# Patient Record
Sex: Female | Born: 1950 | Race: White | Hispanic: No | Marital: Married | Smoking: Never smoker
Health system: Southern US, Community
[De-identification: ages and names within clinical notes are randomized; demographics above are authoritative.]

## PROBLEM LIST (undated history)

## (undated) DIAGNOSIS — R5383 Other fatigue: Secondary | ICD-10-CM

## (undated) DIAGNOSIS — K5900 Constipation, unspecified: Secondary | ICD-10-CM

## (undated) DIAGNOSIS — J45909 Unspecified asthma, uncomplicated: Secondary | ICD-10-CM

## (undated) DIAGNOSIS — M25569 Pain in unspecified knee: Secondary | ICD-10-CM

## (undated) DIAGNOSIS — E559 Vitamin D deficiency, unspecified: Secondary | ICD-10-CM

## (undated) DIAGNOSIS — M549 Dorsalgia, unspecified: Secondary | ICD-10-CM

## (undated) DIAGNOSIS — K219 Gastro-esophageal reflux disease without esophagitis: Secondary | ICD-10-CM

## (undated) DIAGNOSIS — K2970 Gastritis, unspecified, without bleeding: Secondary | ICD-10-CM

## (undated) DIAGNOSIS — R87619 Unspecified abnormal cytological findings in specimens from cervix uteri: Secondary | ICD-10-CM

## (undated) DIAGNOSIS — K5790 Diverticulosis of intestine, part unspecified, without perforation or abscess without bleeding: Secondary | ICD-10-CM

## (undated) DIAGNOSIS — K8020 Calculus of gallbladder without cholecystitis without obstruction: Secondary | ICD-10-CM

## (undated) DIAGNOSIS — C801 Malignant (primary) neoplasm, unspecified: Secondary | ICD-10-CM

## (undated) DIAGNOSIS — K449 Diaphragmatic hernia without obstruction or gangrene: Secondary | ICD-10-CM

## (undated) DIAGNOSIS — D649 Anemia, unspecified: Secondary | ICD-10-CM

## (undated) DIAGNOSIS — R600 Localized edema: Secondary | ICD-10-CM

## (undated) DIAGNOSIS — M1990 Unspecified osteoarthritis, unspecified site: Secondary | ICD-10-CM

## (undated) DIAGNOSIS — M25559 Pain in unspecified hip: Secondary | ICD-10-CM

## (undated) DIAGNOSIS — R0602 Shortness of breath: Secondary | ICD-10-CM

## (undated) HISTORY — DX: Shortness of breath: R06.02

## (undated) HISTORY — DX: Pain in unspecified hip: M25.559

## (undated) HISTORY — DX: Vitamin D deficiency, unspecified: E55.9

## (undated) HISTORY — PX: CHOLECYSTECTOMY: SHX55

## (undated) HISTORY — DX: Diverticulosis of intestine, part unspecified, without perforation or abscess without bleeding: K57.90

## (undated) HISTORY — DX: Unspecified abnormal cytological findings in specimens from cervix uteri: R87.619

## (undated) HISTORY — PX: TUBAL LIGATION: SHX77

## (undated) HISTORY — DX: Diaphragmatic hernia without obstruction or gangrene: K44.9

## (undated) HISTORY — DX: Constipation, unspecified: K59.00

## (undated) HISTORY — DX: Dorsalgia, unspecified: M54.9

## (undated) HISTORY — DX: Unspecified asthma, uncomplicated: J45.909

## (undated) HISTORY — DX: Unspecified osteoarthritis, unspecified site: M19.90

## (undated) HISTORY — DX: Anemia, unspecified: D64.9

## (undated) HISTORY — DX: Malignant (primary) neoplasm, unspecified: C80.1

## (undated) HISTORY — DX: Calculus of gallbladder without cholecystitis without obstruction: K80.20

## (undated) HISTORY — PX: BACK SURGERY: SHX140

## (undated) HISTORY — DX: Gastritis, unspecified, without bleeding: K29.70

## (undated) HISTORY — DX: Pain in unspecified knee: M25.569

## (undated) HISTORY — DX: Gastro-esophageal reflux disease without esophagitis: K21.9

## (undated) HISTORY — DX: Other fatigue: R53.83

## (undated) HISTORY — DX: Localized edema: R60.0

---

## 2004-02-11 ENCOUNTER — Emergency Department (HOSPITAL_COMMUNITY): Admission: EM | Admit: 2004-02-11 | Discharge: 2004-02-11 | Payer: Self-pay | Admitting: Emergency Medicine

## 2007-03-29 ENCOUNTER — Other Ambulatory Visit: Admission: RE | Admit: 2007-03-29 | Discharge: 2007-03-29 | Payer: Self-pay | Admitting: Obstetrics & Gynecology

## 2008-04-09 ENCOUNTER — Other Ambulatory Visit: Admission: RE | Admit: 2008-04-09 | Discharge: 2008-04-09 | Payer: Self-pay | Admitting: Obstetrics & Gynecology

## 2009-07-09 ENCOUNTER — Encounter: Admission: RE | Admit: 2009-07-09 | Discharge: 2009-07-09 | Payer: Self-pay | Admitting: Family Medicine

## 2010-05-22 HISTORY — PX: KNEE SURGERY: SHX244

## 2012-10-16 ENCOUNTER — Other Ambulatory Visit: Payer: Self-pay | Admitting: *Deleted

## 2012-10-16 NOTE — Telephone Encounter (Signed)
Last Aex 01/07/13 Next Aex 01/16/13 #30 with 2 rf to get her to next annual ok to send?

## 2012-10-17 MED ORDER — TEMAZEPAM 15 MG PO CAPS: 15.0000 mg | ORAL_CAPSULE | Freq: Every evening | ORAL | Status: DC | PRN

## 2012-10-17 NOTE — Telephone Encounter (Signed)
Yes

## 2012-12-26 ENCOUNTER — Other Ambulatory Visit: Payer: Self-pay | Admitting: *Deleted

## 2012-12-26 NOTE — Telephone Encounter (Signed)
Fax From: [facility] for Temazepam 15 mg  Last Refilled: 10/16/12 #30/1  (on epic) Aex Scheduled: 01/16/13  Please advise. (Chart in your door)

## 2012-12-27 ENCOUNTER — Telehealth: Payer: Self-pay | Admitting: Obstetrics & Gynecology

## 2012-12-27 MED ORDER — TEMAZEPAM 15 MG PO CAPS: 15.0000 mg | ORAL_CAPSULE | Freq: Every evening | ORAL | Status: DC | PRN

## 2012-12-27 NOTE — Telephone Encounter (Signed)
Rx for Temazepan  Faxed to [facility] and called in to pharmacy also. Left message of this on CB# of patient.

## 2012-12-27 NOTE — Telephone Encounter (Signed)
Patient needs her Temazepam refill please. Has appointment on the 28th.

## 2013-01-16 ENCOUNTER — Ambulatory Visit (INDEPENDENT_AMBULATORY_CARE_PROVIDER_SITE_OTHER): Payer: BC Managed Care – PPO | Admitting: Obstetrics & Gynecology

## 2013-01-16 ENCOUNTER — Encounter: Payer: Self-pay | Admitting: Obstetrics & Gynecology

## 2013-01-16 VITALS — BP 138/86 | HR 60 | Resp 16 | Ht 59.5 in | Wt 219.4 lb

## 2013-01-16 DIAGNOSIS — Z124 Encounter for screening for malignant neoplasm of cervix: Secondary | ICD-10-CM

## 2013-01-16 DIAGNOSIS — Z01419 Encounter for gynecological examination (general) (routine) without abnormal findings: Secondary | ICD-10-CM

## 2013-01-16 MED ORDER — TEMAZEPAM 15 MG PO CAPS: 15.0000 mg | ORAL_CAPSULE | Freq: Every evening | ORAL | Status: DC | PRN

## 2013-01-16 NOTE — Progress Notes (Signed)
62 y.o. G3P2 MarriedCaucasianF here for annual exam.  Raising her 54 year old grandson.  No vaginal bleeding   Patient's last menstrual period was 09/20/2011.          Sexually active: yes  The current method of family planning is status post hysterectomy.    Exercising: no   Smoker:  no  Health Maintenance: Pap:  7/12 History of abnormal Pap:  no MMG:  8/13 Colonoscopy:  12/10, Dr. Elnoria Howard polyp, f/u 5 years BMD:   4/10 0.9/0.5 TDaP:  Up to date per pt, Dr. Ricki Lataja Newland follows Screening Labs: Dr. Ricki Demaryius Imran, Hb today: Dr. Ricki Wess Baney, Urine today: Dr. Ricki Tabias Swayze   reports that she has never smoked. She has never used smokeless tobacco. She reports that she does not drink alcohol or use illicit drugs.  Past Medical History  Diagnosis Date  . Hiatal hernia   . Gastritis     acute  . Diverticulosis   . Gallstones   . Anemia   . GERD (gastroesophageal reflux disease)     Past Surgical History  Procedure Laterality Date  . Tubal ligation    . Cholecystectomy    . Knee surgery  2012    left knee  . Back surgery      Current Outpatient Prescriptions  Medication Sig Dispense Refill  . budesonide-formoterol (SYMBICORT) 160-4.5 MCG/ACT inhaler Inhale 2 puffs into the lungs 2 (two) times daily.      . Cholecalciferol (VITAMIN D PO) Take 5,000 Int'l Units by mouth daily.      . citalopram (CELEXA) 10 MG tablet Take 10 mg by mouth daily.      . hyoscyamine (ANASPAZ) 0.125 MG TBDP tablet Place under the tongue as needed.      . meloxicam (MOBIC) 7.5 MG tablet Take 7.5 mg by mouth as needed for pain.      . montelukast (SINGULAIR) 10 MG tablet Take 10 mg by mouth at bedtime.      . Multiple Vitamins-Minerals (MULTIVITAMIN PO) Take by mouth. Women over 40 vitamin      . Omega-3 Fatty Acids (OMEGA 3 PO) Take by mouth daily.      Marland Kitchen OVER THE COUNTER MEDICATION Hair revive      . pantoprazole (PROTONIX) 40 MG tablet Take 40 mg by mouth daily.      . ranitidine (ZANTAC) 300 MG tablet Take 300 mg by mouth at  bedtime.      . temazepam (RESTORIL) 15 MG capsule Take 1 capsule (15 mg total) by mouth at bedtime as needed for sleep.  30 capsule  0   No current facility-administered medications for this visit.    Family History  Problem Relation Age of Onset  . Adopted: Yes    ROS:  Pertinent items are noted in HPI.  Otherwise, a comprehensive ROS was negative.  Exam:   BP 138/86  Pulse 60  Resp 16  Ht 4' 11.5" (1.511 m)  Wt 219 lb 6.4 oz (99.519 kg)  BMI 43.59 kg/m2  LMP 09/20/2011  Weight change: refused last year  Height: 4' 11.5" (151.1 cm)  Ht Readings from Last 3 Encounters:  01/16/13 4' 11.5" (1.511 m)    General appearance: alert, cooperative and appears stated age Head: Normocephalic, without obvious abnormality, atraumatic Neck: no adenopathy, supple, symmetrical, trachea midline and thyroid normal to inspection and palpation Lungs: clear to auscultation bilaterally Breasts: normal appearance, no masses or tenderness Heart: regular rate and rhythm Abdomen: soft, non-tender; bowel sounds normal; no masses,  no organomegaly Extremities: extremities normal, atraumatic, no cyanosis or edema Skin: Skin color, texture, turgor normal. No rashes or lesions Lymph nodes: Cervical, supraclavicular, and axillary nodes normal. No abnormal inguinal nodes palpated Neurologic: Grossly normal   Pelvic: External genitalia:  no lesions              Urethra:  normal appearing urethra with no masses, tenderness or lesions              Bartholins and Skenes: normal                 Vagina: normal appearing vagina with normal color and discharge, no lesions              Cervix: no lesions              Pap taken: yes Bimanual Exam:  Uterus:  normal size, contour, position, consistency, mobility, non-tender              Adnexa: normal adnexa and no mass, fullness, tenderness               Rectovaginal: Confirms               Anus:  normal sphincter tone, no lesions  A:  Well Woman with normal  exam PMP, no HRT Obesity Low Vit D GERD H/O diverticulosis Hiatal hernia Insomnia  P:   Mammogram yearly.  Pt knows is just due. pap smear with HR HPV today. Labs with Dr. Ricki Aryahi Denzler yearly Temazepam 15mg  qhs prn sleep #30/5RF. return annually or prn  An After Visit Summary was printed and given to the patient.

## 2013-01-16 NOTE — Patient Instructions (Signed)

## 2013-01-17 LAB — IPS PAP TEST WITH HPV

## 2013-03-25 ENCOUNTER — Other Ambulatory Visit: Payer: Self-pay

## 2013-03-25 ENCOUNTER — Other Ambulatory Visit: Payer: Self-pay | Admitting: Internal Medicine

## 2013-03-25 ENCOUNTER — Ambulatory Visit
Admission: RE | Admit: 2013-03-25 | Discharge: 2013-03-25 | Disposition: A | Discharge status: Home / Self Care | Payer: BC Managed Care – PPO | Source: Ambulatory Visit | Attending: Internal Medicine | Admitting: Internal Medicine

## 2013-03-25 DIAGNOSIS — K5732 Diverticulitis of large intestine without perforation or abscess without bleeding: Secondary | ICD-10-CM

## 2013-03-25 MED ORDER — IOHEXOL 300 MG/ML  SOLN: 30.0000 mL | Freq: Once | INTRAMUSCULAR | Status: DC | PRN

## 2013-03-25 MED ORDER — IOHEXOL 300 MG/ML  SOLN
30.0000 mL | Freq: Once | INTRAMUSCULAR | Status: AC | PRN
  Administered 2013-03-25: 30 mL via ORAL

## 2013-03-25 MED ORDER — IOHEXOL 300 MG/ML  SOLN
125.0000 mL | Freq: Once | INTRAMUSCULAR | Status: AC | PRN
  Administered 2013-03-25: 125 mL via INTRAVENOUS

## 2013-07-30 ENCOUNTER — Other Ambulatory Visit: Payer: Self-pay | Admitting: Obstetrics & Gynecology

## 2013-07-30 NOTE — Telephone Encounter (Signed)
Last refilled: AEX 01/16/13 #30/5 refills  No current AEX scheduled   Please Advise.

## 2014-03-23 ENCOUNTER — Encounter: Payer: Self-pay | Admitting: Obstetrics & Gynecology

## 2014-05-11 ENCOUNTER — Ambulatory Visit: Payer: BC Managed Care – PPO | Admitting: Certified Nurse Midwife

## 2014-05-26 ENCOUNTER — Telehealth: Payer: Self-pay | Admitting: Certified Nurse Midwife

## 2014-05-26 NOTE — Telephone Encounter (Signed)
lmtcb to reschedule aex from 05/28/14 with D. Hollice Espy, CNM to another day. Patient is in recall.

## 2014-05-28 ENCOUNTER — Ambulatory Visit: Payer: BC Managed Care – PPO | Admitting: Certified Nurse Midwife

## 2014-10-06 ENCOUNTER — Ambulatory Visit (INDEPENDENT_AMBULATORY_CARE_PROVIDER_SITE_OTHER): Payer: 59 | Admitting: Nurse Practitioner

## 2014-10-06 ENCOUNTER — Encounter: Payer: Self-pay | Admitting: Nurse Practitioner

## 2014-10-06 VITALS — BP 136/80 | HR 80 | Temp 98.5°F | Resp 20 | Ht 59.5 in | Wt 230.0 lb

## 2014-10-06 DIAGNOSIS — Z0000 Encounter for general adult medical examination without abnormal findings: Secondary | ICD-10-CM | POA: Diagnosis not present

## 2014-10-06 DIAGNOSIS — R1030 Lower abdominal pain, unspecified: Secondary | ICD-10-CM | POA: Diagnosis not present

## 2014-10-06 DIAGNOSIS — Z01419 Encounter for gynecological examination (general) (routine) without abnormal findings: Secondary | ICD-10-CM

## 2014-10-06 LAB — CBC WITH DIFFERENTIAL/PLATELET
HCT: 40 % (ref 36.0–46.0)
Hemoglobin: 13 g/dL (ref 12.0–15.0)
LYMPHS PCT: 21 % (ref 12–46)
Lymphs Abs: 1.7 10*3/uL (ref 0.7–4.0)
MCH: 26.6 pg (ref 26.0–34.0)
MCHC: 32.6 g/dL (ref 30.0–36.0)
MCV: 81.9 fL (ref 78.0–100.0)
Monocytes Absolute: 0.3 10*3/uL (ref 0.1–1.0)
Monocytes Relative: 4 % (ref 3–12)
Neutro Abs: 5.9 10*3/uL (ref 1.7–7.7)
Neutrophils Relative %: 75 % (ref 43–77)
PLATELETS: 208 10*3/uL (ref 150–400)
RBC: 4.88 MIL/uL (ref 3.87–5.11)
RDW: 14.6 % (ref 11.5–15.5)
WBC: 7.9 10*3/uL (ref 4.0–10.5)

## 2014-10-06 NOTE — Patient Instructions (Addendum)

## 2014-10-06 NOTE — Progress Notes (Signed)
Patient ID: Megan [name], female   DOB: April 22, 1951, 64 y.o.   MRN: [id number] 64 y.o. G3P2 Married  Caucasian Fe here for annual exam.  History of sudden onset lower bilateral pain in the lower abdomen.  Low grade fever and chills, nausea last pm and today.  Generally feeling week and not well. Ate lunch with 1/2 sandwich. Last BM that was semi loose yesterday.  Small firm BM today. No blood or mucous.  Patient's last menstrual period was 09/20/2011.          Sexually active: Yes.    The current method of family planning is status post menopausal Exercising: No.  The patient does not participate in regular exercise at present. Smoker:  no  Health Maintenance: Pap:  01/16/13, negative with neg HR HPV MMG:  07/22/13, Bi-Rads 2:  Benign Colonoscopy:  04/2009, Dr. Benson Norway, polyp, repeat in 5 years TDaP:  PCP Labs: PCP   reports that she has never smoked. She has never used smokeless tobacco. She reports that she does not drink alcohol or use illicit drugs.  Past Medical History  Diagnosis Date  . Hiatal hernia   . Gastritis     acute  . Diverticulosis   . Gallstones   . Anemia   . GERD (gastroesophageal reflux disease)     Past Surgical History  Procedure Laterality Date  . Cholecystectomy    . Knee surgery  2012    left knee  . Back surgery  around age 57-40  . Tubal ligation  age 15    Current Outpatient Prescriptions  Medication Sig Dispense Refill  . budesonide-formoterol (SYMBICORT) 160-4.5 MCG/ACT inhaler Inhale 2 puffs into the lungs 2 (two) times daily.    . Cholecalciferol (VITAMIN D PO) Take 5,000 Int'l Units by mouth daily.    . hyoscyamine (ANASPAZ) 0.125 MG TBDP tablet Place under the tongue as needed.    . meloxicam (MOBIC) 7.5 MG tablet Take 7.5 mg by mouth as needed for pain.    . montelukast (SINGULAIR) 10 MG tablet Take 10 mg by mouth at bedtime.    . Multiple Vitamins-Minerals (MULTIVITAMIN PO) Take by mouth. Women over 40 vitamin    . Omega-3 Fatty Acids  (OMEGA 3 PO) Take by mouth daily.    . pantoprazole (PROTONIX) 40 MG tablet Take 40 mg by mouth daily.    . ranitidine (ZANTAC) 300 MG tablet Take 300 mg by mouth at bedtime.     No current facility-administered medications for this visit.    Family History  Problem Relation Age of Onset  . Adopted: Yes    ROS:  Pertinent items are noted in HPI.  Otherwise, a comprehensive ROS was negative.  Exam:   BP 136/80 mmHg  Pulse 80  Temp(Src) 98.5 F (36.9 C)  Resp 20  Ht 4' 11.5" (1.511 m)  Wt 230 lb (104.327 kg)  BMI 45.69 kg/m2  LMP 09/20/2011 Height: 4' 11.5" (151.1 cm) Ht Readings from Last 3 Encounters:  10/06/14 4' 11.5" (1.511 m)  01/16/13 4' 11.5" (1.511 m)    General appearance: alert, cooperative and appears stated age Head: Normocephalic, without obvious abnormality, atraumatic Neck: no adenopathy, supple, symmetrical, trachea midline and thyroid normal to inspection and palpation Lungs: clear to auscultation bilaterally Breasts: normal appearance, no masses or tenderness Heart: regular rate and rhythm Abdomen: soft, non-tender overall; no masses,  no organomegaly, BS are normal. Slight tender LLQ Extremities: extremities normal, atraumatic, no cyanosis or edema Skin: Skin color, texture, turgor  normal. No rashes or lesions Lymph nodes: Cervical, supraclavicular, and axillary nodes normal. No abnormal inguinal nodes palpated Neurologic: Grossly normal   Pelvic: External genitalia:  no lesions              Urethra:  normal appearing urethra with no masses, tenderness or lesions              Bartholin's and Skene's: normal                 Vagina: normal appearing vagina with normal color and discharge, no lesions              Cervix: anteverted              Pap taken: No. Bimanual Exam:  Uterus:  normal size, contour, position, consistency, mobility, non-tender              Adnexa: no mass, fullness, tenderness               Rectovaginal: Confirms                Anus:  normal sphincter tone, no lesions  Chaperone present:  yes  A:  Well Woman with normal exam  Postmenopausal - no HRT  Obesity  Low Vit D  GERD  H/O diverticulosis, with current lower abdominal pain  Hiatal hernia  Insomnia  P:   Reviewed health and wellness pertinent to exam  Pap smear as above  Mammogram is due now and will schedule  Stat CBC and will follow - if elevated will refer her back to Dr. Benson Norway.  Counseled on breast self exam, mammography screening, adequate intake of calcium and vitamin D, diet and exercise return annually or prn  An After Visit Summary was printed and given to the patient.     Stat CBC is normal with WBC @ 7.9, Hgb 13.0 - pt is notified of results - if worsening pain to seek GI consult.  She is in agreement with plan. Discussed care with Dr. Sabra Heck

## 2014-10-07 NOTE — Progress Notes (Signed)
Reviewed personally.  M. Suzanne Tabb Croghan, MD.  

## 2014-10-15 ENCOUNTER — Encounter: Payer: Self-pay | Admitting: Obstetrics & Gynecology

## 2015-05-28 ENCOUNTER — Ambulatory Visit: Payer: BC Managed Care – PPO | Admitting: Obstetrics & Gynecology

## 2015-09-21 ENCOUNTER — Encounter (HOSPITAL_COMMUNITY): Payer: Self-pay

## 2015-09-21 ENCOUNTER — Emergency Department (HOSPITAL_COMMUNITY)
Admission: EM | Admit: 2015-09-21 | Discharge: 2015-09-21 | Disposition: A | Discharge status: Home / Self Care | Payer: 59 | Attending: Emergency Medicine | Admitting: Emergency Medicine

## 2015-09-21 DIAGNOSIS — R109 Unspecified abdominal pain: Secondary | ICD-10-CM | POA: Diagnosis not present

## 2015-09-21 DIAGNOSIS — R110 Nausea: Secondary | ICD-10-CM | POA: Diagnosis not present

## 2015-09-21 DIAGNOSIS — R197 Diarrhea, unspecified: Secondary | ICD-10-CM | POA: Diagnosis not present

## 2015-09-21 LAB — COMPREHENSIVE METABOLIC PANEL
ALK PHOS: 87 U/L (ref 38–126)
ALT: 60 U/L — AB (ref 14–54)
AST: 19 U/L (ref 15–41)
Albumin: 3.8 g/dL (ref 3.5–5.0)
Anion gap: 10 (ref 5–15)
BILIRUBIN TOTAL: 0.8 mg/dL (ref 0.3–1.2)
BUN: 6 mg/dL (ref 6–20)
CHLORIDE: 105 mmol/L (ref 101–111)
CO2: 27 mmol/L (ref 22–32)
CREATININE: 0.56 mg/dL (ref 0.44–1.00)
Calcium: 9.5 mg/dL (ref 8.9–10.3)
GFR calc Af Amer: >60 mL/min (ref >60)
Glucose, Bld: 103 mg/dL — ABNORMAL HIGH (ref 65–99)
Potassium: 3.9 mmol/L (ref 3.5–5.1)
Sodium: 142 mmol/L (ref 135–145)
TOTAL PROTEIN: 6.7 g/dL (ref 6.5–8.1)

## 2015-09-21 LAB — LIPASE, BLOOD: Lipase: 20 U/L (ref 11–51)

## 2015-09-21 LAB — URINE MICROSCOPIC-ADD ON

## 2015-09-21 LAB — URINALYSIS, ROUTINE W REFLEX MICROSCOPIC
Bilirubin Urine: NEGATIVE
GLUCOSE, UA: NEGATIVE mg/dL
KETONES UR: 15 mg/dL — AB
Nitrite: NEGATIVE
PROTEIN: NEGATIVE mg/dL
Specific Gravity, Urine: 1.009 (ref 1.005–1.030)
pH: 6 (ref 5.0–8.0)

## 2015-09-21 LAB — CBC
HCT: 40.9 % (ref 36.0–46.0)
Hemoglobin: 13.2 g/dL (ref 12.0–15.0)
MCH: 27.2 pg (ref 26.0–34.0)
MCHC: 32.3 g/dL (ref 30.0–36.0)
MCV: 84.2 fL (ref 78.0–100.0)
PLATELETS: 227 10*3/uL (ref 150–400)
RBC: 4.86 MIL/uL (ref 3.87–5.11)
RDW: 14.5 % (ref 11.5–15.5)
WBC: 7.6 10*3/uL (ref 4.0–10.5)

## 2015-09-21 LAB — I-STAT CG4 LACTIC ACID, ED: Lactic Acid, Venous: 0.63 mmol/L (ref 0.5–2.0)

## 2015-09-21 NOTE — ED Notes (Signed)
Patient here from doctors office to be evaluated for diverticulitis and SBO. Pain with nausea and diarrhea and here for further test

## 2015-10-08 ENCOUNTER — Ambulatory Visit (INDEPENDENT_AMBULATORY_CARE_PROVIDER_SITE_OTHER): Payer: 59 | Admitting: Nurse Practitioner

## 2015-10-08 ENCOUNTER — Encounter: Payer: Self-pay | Admitting: Nurse Practitioner

## 2015-10-08 VITALS — BP 118/76 | HR 88 | Ht 59.0 in | Wt 221.0 lb

## 2015-10-08 DIAGNOSIS — B379 Candidiasis, unspecified: Secondary | ICD-10-CM

## 2015-10-08 DIAGNOSIS — Z0000 Encounter for general adult medical examination without abnormal findings: Secondary | ICD-10-CM | POA: Diagnosis not present

## 2015-10-08 DIAGNOSIS — Z01419 Encounter for gynecological examination (general) (routine) without abnormal findings: Secondary | ICD-10-CM

## 2015-10-08 LAB — HIV ANTIBODY (ROUTINE TESTING W REFLEX): HIV: NONREACTIVE

## 2015-10-08 MED ORDER — FLUCONAZOLE 150 MG PO TABS: 150.0000 mg | ORAL_TABLET | Freq: Once | ORAL | Status: DC

## 2015-10-08 NOTE — Progress Notes (Signed)
Patient ID: Megan [name], female   DOB: 1950-07-07, 65 y.o.   MRN: [id number]  65 y.o. G85P2002 Married  Caucasian Fe here for annual exam.  Recent flare os diverticulitis and UTI treated with Septra and Flagyl.  She is having some external yeast symptoms.  She and husband are raising 2 grandchildren ages 4& 46.  Daughter is a drug addict.  Patient's last menstrual period was 09/20/2011 (approximate).          Sexually active: Yes.    The current method of family planning is tubal ligation and post menopausal status.    Exercising: No.  The patient does not participate in regular exercise at present. Smoker:  no  Health Maintenance: Pap: 01/16/13, Negative with neg HR HPV MMG:09/24/14, Bi-Rads 2: Benign Colonoscopy: 04/2009, Dr. Benson Norway, polyp, repeat in 5 years TDaP: PCP, Dr. Shelia Media Shingles: unsure Pneumonia: discussed with Dr. Shelia Media in past due to allergy/asthma Hep C and HIV: Drawn today Labs:  PCP, Dr. Shelia Media   Urine: recent UTI, last dose of antibiotics 10/07/15   reports that she has never smoked. She has never used smokeless tobacco. She reports that she does not drink alcohol or use illicit drugs.  Past Medical History  Diagnosis Date  . Hiatal hernia   . Gastritis     acute  . Diverticulosis   . Gallstones   . Anemia   . GERD (gastroesophageal reflux disease)     Past Surgical History  Procedure Laterality Date  . Cholecystectomy    . Knee surgery  2012    left knee  . Back surgery  around age 16-42  . Tubal ligation  age 65    Current Outpatient Prescriptions  Medication Sig Dispense Refill  . BREO ELLIPTA 100-25 MCG/INH AEPB Inhale 1 puff into the lungs daily.  12  . Cholecalciferol (VITAMIN D PO) Take 5,000 Int'l Units by mouth daily.    . hyoscyamine (ANASPAZ) 0.125 MG TBDP tablet Place under the tongue as needed.    . meloxicam (MOBIC) 7.5 MG tablet Take 7.5 mg by mouth as needed for pain.    . montelukast (SINGULAIR) 10 MG tablet Take 10 mg by mouth at  bedtime.    . Multiple Vitamins-Minerals (MULTIVITAMIN PO) Take by mouth. Women over 40 vitamin    . Omega-3 Fatty Acids (OMEGA 3 PO) Take by mouth daily.    . pantoprazole (PROTONIX) 40 MG tablet Take 40 mg by mouth daily.    . ranitidine (ZANTAC) 300 MG tablet Take 300 mg by mouth at bedtime.     No current facility-administered medications for this visit.    Family History  Problem Relation Age of Onset  . Adopted: Yes    ROS:  Pertinent items are noted in HPI.  Otherwise, a comprehensive ROS was negative.  Exam:   BP 118/76 mmHg  Pulse 88  Ht 4\' 11"  (1.499 m)  Wt 221 lb (100.245 kg)  BMI 44.61 kg/m2  LMP 09/20/2011 (Approximate) Height: 4\' 11"  (149.9 cm) Ht Readings from Last 3 Encounters:  10/08/15 4\' 11"  (1.499 m)  10/06/14 4' 11.5" (1.511 m)  01/16/13 4' 11.5" (1.511 m)    General appearance: alert, cooperative and appears stated age Head: Normocephalic, without obvious abnormality, atraumatic Neck: no adenopathy, supple, symmetrical, trachea midline and thyroid normal to inspection and palpation Lungs: clear to auscultation bilaterally Breasts: normal appearance, no masses or tenderness Heart: regular rate and rhythm Abdomen: soft, non-tender; no masses,  no organomegaly Extremities: extremities normal, atraumatic,  no cyanosis or edema Skin: Skin color, texture, turgor normal. No rashes or lesions Lymph nodes: Cervical, supraclavicular, and axillary nodes normal. No abnormal inguinal nodes palpated Neurologic: Grossly normal   Pelvic: External genitalia:  no lesions but yeast symptoms to the rectum              Urethra:  normal appearing urethra with no masses, tenderness or lesions              Bartholin's and Skene's: normal                 Vagina: normal appearing vagina with normal color and discharge, no lesions              Cervix: anteverted              Pap taken: Yes.   Bimanual Exam:  Uterus:  normal size, contour, position, consistency, mobility,  non-tender              Adnexa: no mass, fullness, tenderness               Rectovaginal: deferred               Anus:  Deferred due to recent diarrhea  Chaperone present: yes  A:  Well Woman with normal exam  Postmenopausal - no HRT Obesity Low Vit D - followed by PCP GERD H/O diverticulosis, with current lower abdominal pain and flare 2 wk's ago Hiatal hernia Insomnia  P:   Reviewed health and wellness pertinent to exam  Pap smear as above  Mammogram is due 09/2015  Diflucan 150 mg X 2 doses for external yeast  Will recheck Vit D today per request  Counseled on breast self exam, mammography screening, adequate intake of calcium and vitamin D, diet and exercise return annually or prn  An After Visit Summary was printed and given to the patient.

## 2015-10-08 NOTE — Patient Instructions (Addendum)

## 2015-10-09 LAB — HEPATITIS C ANTIBODY: HCV AB: NEGATIVE

## 2015-10-09 LAB — VITAMIN D 25 HYDROXY (VIT D DEFICIENCY, FRACTURES): Vit D, 25-Hydroxy: 26 ng/mL — ABNORMAL LOW (ref 30–100)

## 2015-10-10 NOTE — Progress Notes (Signed)
Encounter reviewed by Dr. Brook Amundson C. Silva.  

## 2015-10-11 ENCOUNTER — Telehealth: Payer: Self-pay | Admitting: *Deleted

## 2015-10-11 NOTE — Telephone Encounter (Signed)
-----   Message from Kem Boroughs, [place] sent at 10/11/2015  8:10 AM EDT ----- Please let pt know that Hep C and HIV are both negative as expected. The Vit D was 26 on her current dose.  Not sure how that compares to previous level.  Our goal for her is still around 50. Check with PCP as to last report and can make adjustments in dosing if needed.

## 2015-10-11 NOTE — Telephone Encounter (Signed)
I have attempted to contact this patient by phone with the following results: left message to return call to Peter at [phone] on answering machine (home/mobile per DPR).  No personal information given. [phone] (Mobile)

## 2015-10-11 NOTE — Telephone Encounter (Signed)
Returning a call to Stephanie. °

## 2015-10-12 NOTE — Telephone Encounter (Signed)
Patient returning call.

## 2015-10-13 LAB — IPS PAP TEST WITH HPV

## 2015-10-13 NOTE — Telephone Encounter (Signed)
Pt notified in result note.  Closing encounter. 

## 2015-12-20 ENCOUNTER — Other Ambulatory Visit: Payer: Self-pay | Admitting: Gastroenterology

## 2015-12-20 DIAGNOSIS — R1032 Left lower quadrant pain: Secondary | ICD-10-CM

## 2015-12-21 ENCOUNTER — Ambulatory Visit
Admission: RE | Admit: 2015-12-21 | Discharge: 2015-12-21 | Disposition: A | Discharge status: Home / Self Care | Payer: 59 | Source: Ambulatory Visit | Attending: Gastroenterology | Admitting: Gastroenterology

## 2015-12-21 DIAGNOSIS — R1032 Left lower quadrant pain: Secondary | ICD-10-CM

## 2015-12-21 MED ORDER — IOPAMIDOL (ISOVUE-300) INJECTION 61%
100.0000 mL | Freq: Once | INTRAVENOUS | Status: AC | PRN
  Administered 2015-12-21: 100 mL via INTRAVENOUS

## 2016-10-13 ENCOUNTER — Ambulatory Visit: Payer: 59 | Admitting: Nurse Practitioner

## 2016-12-11 ENCOUNTER — Ambulatory Visit: Payer: 59 | Admitting: Nurse Practitioner

## 2016-12-12 ENCOUNTER — Telehealth: Payer: Self-pay | Admitting: Certified Nurse Midwife

## 2016-12-12 NOTE — Telephone Encounter (Signed)
Left patient a message to call back when ready to reschedule, canceled by automated reminder call. °

## 2016-12-14 ENCOUNTER — Ambulatory Visit: Payer: 59 | Admitting: Certified Nurse Midwife

## 2017-02-23 ENCOUNTER — Ambulatory Visit: Payer: 59 | Admitting: Certified Nurse Midwife

## 2017-04-25 ENCOUNTER — Encounter: Payer: Self-pay | Admitting: Certified Nurse Midwife

## 2017-04-25 ENCOUNTER — Other Ambulatory Visit: Payer: Self-pay

## 2017-04-25 ENCOUNTER — Ambulatory Visit (INDEPENDENT_AMBULATORY_CARE_PROVIDER_SITE_OTHER): Payer: BLUE CROSS/BLUE SHIELD | Admitting: Certified Nurse Midwife

## 2017-04-25 VITALS — Ht 58.75 in | Wt 225.0 lb

## 2017-04-25 DIAGNOSIS — N951 Menopausal and female climacteric states: Secondary | ICD-10-CM

## 2017-04-25 DIAGNOSIS — R600 Localized edema: Secondary | ICD-10-CM | POA: Diagnosis not present

## 2017-04-25 DIAGNOSIS — Z01419 Encounter for gynecological examination (general) (routine) without abnormal findings: Secondary | ICD-10-CM

## 2017-04-25 DIAGNOSIS — Z659 Problem related to unspecified psychosocial circumstances: Secondary | ICD-10-CM | POA: Diagnosis not present

## 2017-04-25 NOTE — Patient Instructions (Addendum)
EXERCISE AND DIET:  We recommended that you start or continue a regular exercise program for good health. Regular exercise means any activity that makes your heart beat faster and makes you sweat.  We recommend exercising at least 30 minutes per day at least 3 days a week, preferably 4 or 5.  We also recommend a diet low in fat and sugar.  Inactivity, poor dietary choices and obesity can cause diabetes, heart attack, stroke, and kidney damage, among others.    ALCOHOL AND SMOKING:  Women should limit their alcohol intake to no more than 7 drinks/beers/glasses of wine (combined, not each!) per week. Moderation of alcohol intake to this level decreases your risk of breast cancer and liver damage. And of course, no recreational drugs are part of a healthy lifestyle.  And absolutely no smoking or even second hand smoke. Most people know smoking can cause heart and lung diseases, but did you know it also contributes to weakening of your bones? Aging of your skin?  Yellowing of your teeth and nails?  CALCIUM AND VITAMIN D:  Adequate intake of calcium and Vitamin D are recommended.  The recommendations for exact amounts of these supplements seem to change often, but generally speaking 600 mg of calcium (either carbonate or citrate) and 800 units of Vitamin D per day seems prudent. Certain women may benefit from higher intake of Vitamin D.  If you are among these women, your doctor will have told you during your visit.    PAP SMEARS:  Pap smears, to check for cervical cancer or precancers,  have traditionally been done yearly, although recent scientific advances have shown that most women can have pap smears less often.  However, every woman still should have a physical exam from her gynecologist every year. It will include a breast check, inspection of the vulva and vagina to check for abnormal growths or skin changes, a visual exam of the cervix, and then an exam to evaluate the size and shape of the uterus and  ovaries.  And after 64 years of age, a rectal exam is indicated to check for rectal cancers. We will also provide age appropriate advice regarding health maintenance, like when you should have certain vaccines, screening for sexually transmitted diseases, bone density testing, colonoscopy, mammograms, etc.   MAMMOGRAMS:  All women over 40 years old should have a yearly mammogram. Many facilities now offer a "3D" mammogram, which may cost around $50 extra out of pocket. If possible,  we recommend you accept the option to have the 3D mammogram performed.  It both reduces the number of women who will be called back for extra views which then turn out to be normal, and it is better than the routine mammogram at detecting truly abnormal areas.    COLONOSCOPY:  Colonoscopy to screen for colon cancer is recommended for all women at age 50.  We know, you hate the idea of the prep.  We agree, BUT, having colon cancer and not knowing it is worse!!  Colon cancer so often starts as a polyp that can be seen and removed at colonscopy, which can quite literally save your life!  And if your first colonoscopy is normal and you have no family history of colon cancer, most women don't have to have it again for 10 years.  Once every ten years, you can do something that may end up saving your life, right?  We will be happy to help you get it scheduled when you are ready.    Be sure to check your insurance coverage so you understand how much it will cost.  It may be covered as a preventative service at no cost, but you should check your particular policy.      Edema Edema is when you have too much fluid in your body or under your skin. Edema may make your legs, feet, and ankles swell up. Swelling is also common in looser tissues, like around your eyes. This is a common condition. It gets more common as you get older. There are many possible causes of edema. Eating too much salt (sodium) and being on your feet or sitting for a long  time can cause edema in your legs, feet, and ankles. Hot weather may make edema worse. Edema is usually painless. Your skin may look swollen or shiny. Follow these instructions at home:  Keep the swollen body part raised (elevated) above the level of your heart when you are sitting or lying down.  Do not sit still or stand for a long time.  Do not wear tight clothes. Do not wear garters on your upper legs.  Exercise your legs. This can help the swelling go down.  Wear elastic bandages or support stockings as told by your doctor.  Eat a low-salt (low-sodium) diet to reduce fluid as told by your doctor.  Depending on the cause of your swelling, you may need to limit how much fluid you drink (fluid restriction).  Take over-the-counter and prescription medicines only as told by your doctor. Contact a doctor if:  Treatment is not working.  You have heart, liver, or kidney disease and have symptoms of edema.  You have sudden and unexplained weight gain. Get help right away if:  You have shortness of breath or chest pain.  You cannot breathe when you lie down.  You have pain, redness, or warmth in the swollen areas.  You have heart, liver, or kidney disease and get edema all of a sudden.  You have a fever and your symptoms get worse all of a sudden. Summary  Edema is when you have too much fluid in your body or under your skin.  Edema may make your legs, feet, and ankles swell up. Swelling is also common in looser tissues, like around your eyes.  Raise (elevate) the swollen body part above the level of your heart when you are sitting or lying down.  Follow your doctor's instructions about diet and how much fluid you can drink (fluid restriction). This information is not intended to replace advice given to you by your health care provider. Make sure you discuss any questions you have with your health care provider. Document Released: 10/25/2007 Document Revised: 05/26/2016  Document Reviewed: 05/26/2016 Elsevier Interactive Patient Education  2017 [employer].

## 2017-04-25 NOTE — Progress Notes (Signed)
66 y.o. G81P2002 Married  Caucasian Fe here for annual exam. Menopausal no HRT. Denies vaginal bleeding. Sees Dr. Shelia Media for medication management of asthma and aex, labs. Previously seen with Dr. Minna Antis and now looking for new internist. Having swelling of feet with sitting and standing, has changed shoes looser ones and this has helped. Uses salt all the time and cooks with also. Drinks water of and on and limited soda. Social stress with death of daughter in last year and now caring for her two children. Working full time too. Some left had weakness from constant use on computer, plans orthopedic evaluation soon. No other health issues today.  Patient's last menstrual period was 09/20/2011 (approximate).          Sexually active: Yes.    The current method of family planning is tubal ligation.    Exercising: Yes.    walk Smoker:  no  Health Maintenance: Pap:  01-16-13 neg HPV HR neg, 10-08-15 neg HPV HR neg History of Abnormal Pap: yes just a repeat MMG:  2018 @ [facility]-neg pt to sign for release per patient negative Self Breast exams: occ Colonoscopy:  2016 f/u 49yrs BMD:   4/10, TDaP:  Done with  pcp Shingles: not done Pneumonia: not done Hep C and HIV: both neg 2017 Labs: with PCP   reports that  has never smoked. she has never used smokeless tobacco. She reports that she does not drink alcohol or use drugs.  Past Medical History:  Diagnosis Date  . Anemia   . Diverticulosis   . Gallstones   . Gastritis    acute  . GERD (gastroesophageal reflux disease)   . Hiatal hernia     Past Surgical History:  Procedure Laterality Date  . BACK SURGERY  around age [age]  . CHOLECYSTECTOMY    . KNEE SURGERY  2012   left knee  . TUBAL LIGATION  age 64    Current Outpatient Medications  Medication Sig Dispense Refill  . BREO ELLIPTA 100-25 MCG/INH AEPB Inhale 1 puff into the lungs daily.  12  . Cholecalciferol (VITAMIN D PO) Take 5,000 Int'l Units by mouth daily.    . fluconazole  (DIFLUCAN) 150 MG tablet Take 1 tablet (150 mg total) by mouth once. Take one tablet.  Repeat in 48 hours if symptoms are not completely resolved. 2 tablet 0  . hyoscyamine (ANASPAZ) 0.125 MG TBDP tablet Place under the tongue as needed.    . meloxicam (MOBIC) 7.5 MG tablet Take 7.5 mg by mouth as needed for pain.    . montelukast (SINGULAIR) 10 MG tablet Take 10 mg by mouth at bedtime.    . Multiple Vitamins-Minerals (MULTIVITAMIN PO) Take by mouth. Women over 40 vitamin    . Omega-3 Fatty Acids (OMEGA 3 PO) Take by mouth daily.    . pantoprazole (PROTONIX) 40 MG tablet Take 40 mg by mouth daily.    . ranitidine (ZANTAC) 300 MG tablet Take 300 mg by mouth at bedtime.     No current facility-administered medications for this visit.     Family History  Adopted: Yes    ROS:  Pertinent items are noted in HPI.  Otherwise, a comprehensive ROS was negative.  Exam:   LMP 09/20/2011 (Approximate)    Ht Readings from Last 3 Encounters:  10/08/15 4\' 11"  (1.499 m)  10/06/14 4' 11.5" (1.511 m)  01/16/13 4' 11.5" (1.511 m)    General appearance: alert, cooperative and appears stated age Head: Normocephalic, without obvious  abnormality, atraumatic Neck: no adenopathy, supple, symmetrical, trachea midline and thyroid normal to inspection and palpation Lungs: clear to auscultation bilaterally Breasts: normal appearance, no masses or tenderness, No nipple retraction or dimpling, No nipple discharge or bleeding, No axillary or supraclavicular adenopathy Heart: regular rate and rhythm Abdomen: soft, non-tender; no masses,  no organomegaly Extremities: extremities normal, atraumatic, no cyanosis or edema Skin: Skin color, texture, turgor normal. No rashes or lesions Lymph nodes: Cervical, supraclavicular, and axillary nodes normal. No abnormal inguinal nodes palpated Neurologic: Grossly normal   Pelvic: External genitalia:  no lesions              Urethra:  normal appearing urethra with no  masses, tenderness or lesions              Bartholin's and Skene's: normal                 Vagina: normal appearing vagina with normal color and discharge, no lesions              Cervix: no cervical motion tenderness and no lesions              Pap taken: No. Bimanual Exam:  Uterus:  normal size, contour, position, consistency, mobility, non-tender              Adnexa: normal adnexa and no mass, fullness, tenderness               Rectovaginal: Confirms               Anus:  normal sphincter tone, no lesions  Chaperone present: yes  A:  Well Woman with normal exam  Menopausal no HRT  Morbid obesity  Feet edema   Left hand weakness planning to see Orthopedic  Asthma management with PCP  BMD due patient will schedule  Social stress with daughter's death P:   Reviewed health and wellness pertinent to exam  Aware of need to advise if vaginal bleeding  Discussed increase weight can increase feet edema and discomfort, also salt intake contributes. Discussed limiting salt intake, avoid adding while cooking and rinsing low salt added foods. Cooking with frozen, fresh to avoid salt. Increase water intake. Avoid prolong standing or sitting to decrease edema. Discussed increase risk of hypertension with edema. Questions addressed. Will follow up with PCP regarding. Patient has worked with [employer] before and can loose weight, she will try to become motivated. Offered referral for hand, patient will schedule.  Discussed BMD evaluation for prevention, she will schedule.  Discussed seeking family and friends support and hospice grief is available.  Pap smear: no  counseled on breast self exam, mammography screening, feminine hygiene, adequate intake of calcium and vitamin D, diet and exercise  return annually or prn  An After Visit Summary was printed and given to the patient.

## 2017-05-11 ENCOUNTER — Encounter: Payer: Self-pay | Admitting: Obstetrics & Gynecology

## 2017-07-13 ENCOUNTER — Other Ambulatory Visit: Payer: Self-pay | Admitting: Internal Medicine

## 2017-07-13 DIAGNOSIS — E010 Iodine-deficiency related diffuse (endemic) goiter: Secondary | ICD-10-CM

## 2017-08-01 DIAGNOSIS — D34 Benign neoplasm of thyroid gland: Secondary | ICD-10-CM | POA: Insufficient documentation

## 2017-08-01 DIAGNOSIS — K1120 Sialoadenitis, unspecified: Secondary | ICD-10-CM | POA: Insufficient documentation

## 2018-02-07 DIAGNOSIS — C4491 Basal cell carcinoma of skin, unspecified: Secondary | ICD-10-CM

## 2018-02-07 HISTORY — DX: Basal cell carcinoma of skin, unspecified: C44.91

## 2018-04-24 DIAGNOSIS — C801 Malignant (primary) neoplasm, unspecified: Secondary | ICD-10-CM

## 2018-04-24 HISTORY — PX: BASAL CELL CARCINOMA EXCISION: SHX1214

## 2018-04-24 HISTORY — DX: Malignant (primary) neoplasm, unspecified: C80.1

## 2018-05-02 DIAGNOSIS — C4491 Basal cell carcinoma of skin, unspecified: Secondary | ICD-10-CM | POA: Insufficient documentation

## 2018-05-10 ENCOUNTER — Ambulatory Visit: Payer: BLUE CROSS/BLUE SHIELD | Admitting: Certified Nurse Midwife

## 2018-05-10 ENCOUNTER — Encounter: Payer: Self-pay | Admitting: Certified Nurse Midwife

## 2018-05-10 NOTE — Progress Notes (Deleted)
67 y.o. [id number] Married  {Race/ethnicity:17218} Fe here for annual exam.    Patient's last menstrual period was 09/20/2011 (approximate).          Sexually active: {yes no:314532}  The current method of family planning is tubal ligation.    Exercising: {yes no:314532}  {types:19826} Smoker:  {YES NO:22349}  ROS  Health Maintenance: Pap:  10-08-15 neg HPV HR neg History of Abnormal Pap: {YES NO:22349} MMG:  01-19-17 category b density birads 1:neg Self Breast exams: {YES NO:22349} Colonoscopy:  2016 f/u 68yrs BMD:   4/10 TDaP:  Done with pcp Shingles: *** Pneumonia: *** Hep C and HIV: both neg 2017 Labs: ***   reports that she has never smoked. She has never used smokeless tobacco. She reports that she does not drink alcohol or use drugs.  Past Medical History:  Diagnosis Date  . Abnormal Pap smear of cervix    just a repeat done yrs ago  . Anemia   . Diverticulosis   . Gallstones   . Gastritis    acute  . GERD (gastroesophageal reflux disease)   . Hiatal hernia     Past Surgical History:  Procedure Laterality Date  . BACK SURGERY  around age [age]  . CHOLECYSTECTOMY    . KNEE SURGERY  2012   left knee  . TUBAL LIGATION  age 89    Current Outpatient Medications  Medication Sig Dispense Refill  . Budesonide-Formoterol Fumarate (SYMBICORT IN) Inhale into the lungs.    . Cholecalciferol (VITAMIN D PO) Take 6,000 Int'l Units by mouth daily.     . hyoscyamine (ANASPAZ) 0.125 MG TBDP tablet Place under the tongue as needed.    . montelukast (SINGULAIR) 10 MG tablet Take 10 mg by mouth at bedtime.    . Multiple Vitamins-Minerals (MULTIVITAMIN PO) Take by mouth. Women over 40 vitamin    . pantoprazole (PROTONIX) 40 MG tablet Take 40 mg by mouth daily.     No current facility-administered medications for this visit.     Family History  Adopted: Yes  Problem Relation Age of Onset  . Diabetes Mother   . Heart disease Mother     ROS:  Pertinent items are noted in  HPI.  Otherwise, a comprehensive ROS was negative.  Exam:   LMP 09/20/2011 (Approximate)    Ht Readings from Last 3 Encounters:  04/25/17 4' 10.75" (1.492 m)  10/08/15 4\' 11"  (1.499 m)  10/06/14 4' 11.5" (1.511 m)    General appearance: alert, cooperative and appears stated age Head: Normocephalic, without obvious abnormality, atraumatic Neck: no adenopathy, supple, symmetrical, trachea midline and thyroid {EXAM; THYROID:18604} Lungs: clear to auscultation bilaterally Breasts: {Exam; breast:13139::"normal appearance, no masses or tenderness"} Heart: regular rate and rhythm Abdomen: soft, non-tender; no masses,  no organomegaly Extremities: extremities normal, atraumatic, no cyanosis or edema Skin: Skin color, texture, turgor normal. No rashes or lesions Lymph nodes: Cervical, supraclavicular, and axillary nodes normal. No abnormal inguinal nodes palpated Neurologic: Grossly normal   Pelvic: External genitalia:  no lesions              Urethra:  normal appearing urethra with no masses, tenderness or lesions              Bartholin's and Skene's: normal                 Vagina: normal appearing vagina with normal color and discharge, no lesions              Cervix: {  exam; cervix:14595}              Pap taken: {yes no:314532} Bimanual Exam:  Uterus:  {exam; uterus:12215}              Adnexa: {exam; adnexa:12223}               Rectovaginal: Confirms               Anus:  normal sphincter tone, no lesions  Chaperone present: ***  A:  Well Woman with normal exam  P:   Reviewed health and wellness pertinent to exam  Pap smear: {YES NO:22349}  {plan; gyn:5269::"mammogram","pap smear","return annually or prn"}  An After Visit Summary was printed and given to the patient.

## 2018-05-17 NOTE — Progress Notes (Signed)
67 y.o. G104P2002 Married Caucasian female here for annual exam.    Some vaginal discharge.  No itching, burning, or odor.   Patient complaining of decreased libido.  Having LE edema.  Holiday weight gain.  Thinking about Weight Watchers.  States she is going to make lifestyle changes.  Knows she needs to exercise and increase her protein.   Works for [employer]. Thinking about retirement.  Raising 2 grandchildren.   PCP:  Deland Pretty, MD   Patient's last menstrual period was 09/20/2011 (approximate).           Sexually active: No.  The current method of family planning is tubal ligation.    Exercising: No.  The patient does not participate in regular exercise at present. Smoker:  no  Health Maintenance: Pap: 10-08-15 Neg:Neg HR HPV, 01-16-13 Neg:Neg HR HPV History of abnormal Pap:  no MMG:  01-19-17 3D Neg/density b/BiRads1--she will make appt. Colonoscopy: 2019 normal;next due 5 years BMD:   Years ago  Result  Normal per patient TDaP:  PCP Gardasil:   no HIV: 10-08-15 NR Hep C: 10-08-15 Neg Screening Labs:  Hb today: PCP   reports that she has never smoked. She has never used smokeless tobacco. She reports that she does not drink alcohol or use drugs.  Past Medical History:  Diagnosis Date  . Abnormal Pap smear of cervix    just a repeat done yrs ago  . Anemia   . 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 Diverticulosis   . Gallstones   . Gastritis    acute  . GERD (gastroesophageal reflux disease)   . Hiatal hernia     Past Surgical History:  Procedure Laterality Date  . BACK SURGERY  around age [age]  . BASAL CELL CARCINOMA EXCISION  04/24/2018   nose  . CHOLECYSTECTOMY    . KNEE SURGERY  2012   left knee  . TUBAL LIGATION  age 25    Current Outpatient Medications  Medication Sig Dispense Refill  . budesonide-formoterol (SYMBICORT) 160-4.5 MCG/ACT inhaler Inhale 1 puff into the lungs daily.    . Cholecalciferol (VITAMIN D PO) Take 6,000 Int'l Units by  mouth daily.     . hyoscyamine (ANASPAZ) 0.125 MG TBDP tablet Place under the tongue as needed.    . metroNIDAZOLE (METROCREAM) 0.75 % cream Apply 1 application topically daily.    . mometasone (ELOCON) 0.1 % cream Apply 1 application topically daily.    . montelukast (SINGULAIR) 10 MG tablet Take 10 mg by mouth at bedtime.    . Multiple Vitamins-Minerals (MULTIVITAMIN PO) Take by mouth. Women over 40 vitamin    . pantoprazole (PROTONIX) 40 MG tablet Take 40 mg by mouth daily.     No current facility-administered medications for this visit.     Family History  Adopted: Yes  Problem Relation Age of Onset  . Diabetes Mother   . Heart disease Mother     Review of Systems  Constitutional:       Weight gain--due to holidays    Exam:   BP (!) 158/80 (BP Location: Right Arm, Patient Position: Sitting, Cuff Size: Normal) Comment (Cuff Size): taken at wrist  Pulse 76   Resp 20   Ht 4\' 11"  (1.499 m)   LMP 09/20/2011 (Approximate)   BMI 45.44 kg/m     General appearance: alert, cooperative and appears stated age Head: Normocephalic, without obvious abnormality, atraumatic Neck: no adenopathy, supple, symmetrical, trachea midline and thyroid normal to  inspection and palpation Lungs: clear to auscultation bilaterally Breasts: normal appearance, no masses or tenderness, No nipple retraction or dimpling, No nipple discharge or bleeding, No axillary or supraclavicular adenopathy Heart: regular rate and rhythm Abdomen: soft, non-tender; no masses, no organomegaly Extremities: extremities normal, atraumatic, no cyanosis or edema Skin: Skin color, texture, turgor normal. No rashes or lesions Lymph nodes: Cervical, supraclavicular, and axillary nodes normal. No abnormal inguinal nodes palpated Neurologic: Grossly normal  Pelvic: External genitalia:  no lesions              Urethra:  normal appearing urethra with no masses, tenderness or lesions              Bartholins and Skenes: normal                  Vagina: normal appearing vagina with normal color and discharge, no lesions              Cervix: no lesions              Pap taken: Yes.   Bimanual Exam:  Uterus:  normal size, contour, position, consistency, mobility, non-tender              Adnexa: no mass, fullness, tenderness              Rectal exam: Yes.  .  Confirms.              Anus:  normal sphincter tone, no lesions  Chaperone was present for exam.  Assessment:   Well woman visit with normal exam. Hx abnormal pap.  Vaginitis.  Obesity.   Plan: Mammogram screening. Recommended self breast awareness. Pap and HR HPV as above.  Vaginitis testing from pap. Guidelines for Calcium, Vitamin D, regular exercise program including cardiovascular and weight bearing exercise. Will order BMD for [facility].  I discussed her limited pelvic exam due to her BMI and offered pelvic US.  She will monitor for symptoms and call back if she has bleeding, pain, bloating, changes in bowel or any other concern. Follow up annually and prn.   After visit summary provided.

## 2018-05-20 ENCOUNTER — Other Ambulatory Visit (HOSPITAL_COMMUNITY)
Admission: RE | Admit: 2018-05-20 | Discharge: 2018-05-20 | Disposition: A | Discharge status: Home / Self Care | Payer: BLUE CROSS/BLUE SHIELD | Source: Ambulatory Visit | Attending: Obstetrics and Gynecology | Admitting: Obstetrics and Gynecology

## 2018-05-20 ENCOUNTER — Ambulatory Visit (INDEPENDENT_AMBULATORY_CARE_PROVIDER_SITE_OTHER): Payer: BLUE CROSS/BLUE SHIELD | Admitting: Obstetrics and Gynecology

## 2018-05-20 ENCOUNTER — Other Ambulatory Visit: Payer: Self-pay

## 2018-05-20 ENCOUNTER — Encounter: Payer: Self-pay | Admitting: Obstetrics and Gynecology

## 2018-05-20 VITALS — BP 158/80 | HR 76 | Resp 20 | Ht 59.0 in

## 2018-05-20 DIAGNOSIS — Z01419 Encounter for gynecological examination (general) (routine) without abnormal findings: Secondary | ICD-10-CM | POA: Insufficient documentation

## 2018-05-20 DIAGNOSIS — N760 Acute vaginitis: Secondary | ICD-10-CM | POA: Diagnosis not present

## 2018-05-20 NOTE — Patient Instructions (Signed)

## 2018-05-23 LAB — CYTOLOGY - PAP
Bacterial vaginitis: POSITIVE — AB
Candida vaginitis: NEGATIVE
Diagnosis: NEGATIVE
HPV (WINDOPATH): NOT DETECTED
Trichomonas: NEGATIVE

## 2018-05-27 ENCOUNTER — Other Ambulatory Visit: Payer: Self-pay | Admitting: *Deleted

## 2018-05-27 MED ORDER — METRONIDAZOLE 500 MG PO TABS: 500.0000 mg | ORAL_TABLET | Freq: Two times a day (BID) | ORAL | 0 refills | Status: DC

## 2018-05-28 ENCOUNTER — Telehealth: Payer: Self-pay | Admitting: Obstetrics and Gynecology

## 2018-05-28 MED ORDER — CLINDAMYCIN PHOSPHATE 100 MG VA SUPP: 100.0000 mg | Freq: Every day | VAGINAL | 0 refills | Status: DC

## 2018-05-28 NOTE — Telephone Encounter (Signed)
Dr. Quincy Simmonds -please advise on alternative to Flagyl.

## 2018-05-28 NOTE — Telephone Encounter (Signed)
Spoke with patient, advised as seen below per Dr. Quincy Simmonds. Patient request allergies to be updated to include metronidazole. Patient request Rx for clindamycin vaginal ovule to pharmacy on file. Patient verbalizes understanding and is agreeable.   Routing to provider for final review. Patient is agreeable to disposition. Will close encounter.

## 2018-05-28 NOTE — Telephone Encounter (Signed)
Patient called stating that she started a medication yesterday. She was told that it could upset her stomach. Patient has been experiencing stomach cramping and stated "I just cannot take this." Patient stated that there was another kind of medication that could be called in, but patient is unaware of the name of either medication.

## 2018-05-28 NOTE — Telephone Encounter (Signed)
Options are: Metrogel (Metronidazol) pv at hs nightly for 5 nights.  Clindamycin 100 mg vaginal ovule in vagina at hs for 7 nights.

## 2018-05-29 ENCOUNTER — Telehealth: Payer: Self-pay | Admitting: Obstetrics and Gynecology

## 2018-05-29 MED ORDER — METRONIDAZOLE 0.75 % VA GEL: 1.0000 | Freq: Every day | VAGINAL | 0 refills | Status: AC

## 2018-05-29 NOTE — Telephone Encounter (Signed)
Patient would like to speak with nurse regarding the prescription that was called in for her yesterday.

## 2018-05-29 NOTE — Telephone Encounter (Signed)
Spoke with patient. Patient requesting metrogel RX. Patient states clindamycin vag suppository out of pocket cost $150. Advised will send in RX for Metrogel qhs x5 nights. RN will call to cancel Rx for clindamycin. Patient verbalizes understanding.   Call placed to [facility], spoke with [facility] [place], Rx cancelled for clindamycin. Metrogel out of pocket cost $5.   Routing to provider for final review. Patient is agreeable to disposition. Will close encounter.

## 2018-06-18 ENCOUNTER — Encounter: Payer: Self-pay | Admitting: Obstetrics & Gynecology

## 2018-11-11 DIAGNOSIS — M79671 Pain in right foot: Secondary | ICD-10-CM | POA: Diagnosis not present

## 2018-11-11 DIAGNOSIS — M25511 Pain in right shoulder: Secondary | ICD-10-CM | POA: Diagnosis not present

## 2018-11-13 DIAGNOSIS — M79671 Pain in right foot: Secondary | ICD-10-CM | POA: Diagnosis not present

## 2018-11-13 DIAGNOSIS — M67912 Unspecified disorder of synovium and tendon, left shoulder: Secondary | ICD-10-CM | POA: Diagnosis not present

## 2018-11-13 DIAGNOSIS — M67911 Unspecified disorder of synovium and tendon, right shoulder: Secondary | ICD-10-CM | POA: Diagnosis not present

## 2018-11-14 DIAGNOSIS — M7541 Impingement syndrome of right shoulder: Secondary | ICD-10-CM | POA: Diagnosis not present

## 2018-11-14 DIAGNOSIS — S46812D Strain of other muscles, fascia and tendons at shoulder and upper arm level, left arm, subsequent encounter: Secondary | ICD-10-CM | POA: Diagnosis not present

## 2018-11-14 DIAGNOSIS — S46011D Strain of muscle(s) and tendon(s) of the rotator cuff of right shoulder, subsequent encounter: Secondary | ICD-10-CM | POA: Diagnosis not present

## 2018-11-18 DIAGNOSIS — S46812D Strain of other muscles, fascia and tendons at shoulder and upper arm level, left arm, subsequent encounter: Secondary | ICD-10-CM | POA: Diagnosis not present

## 2018-11-18 DIAGNOSIS — M7541 Impingement syndrome of right shoulder: Secondary | ICD-10-CM | POA: Diagnosis not present

## 2018-11-18 DIAGNOSIS — S46011D Strain of muscle(s) and tendon(s) of the rotator cuff of right shoulder, subsequent encounter: Secondary | ICD-10-CM | POA: Diagnosis not present

## 2018-11-20 DIAGNOSIS — S46011D Strain of muscle(s) and tendon(s) of the rotator cuff of right shoulder, subsequent encounter: Secondary | ICD-10-CM | POA: Diagnosis not present

## 2018-11-20 DIAGNOSIS — M7541 Impingement syndrome of right shoulder: Secondary | ICD-10-CM | POA: Diagnosis not present

## 2018-11-20 DIAGNOSIS — S46812D Strain of other muscles, fascia and tendons at shoulder and upper arm level, left arm, subsequent encounter: Secondary | ICD-10-CM | POA: Diagnosis not present

## 2018-11-25 DIAGNOSIS — S46812D Strain of other muscles, fascia and tendons at shoulder and upper arm level, left arm, subsequent encounter: Secondary | ICD-10-CM | POA: Diagnosis not present

## 2018-11-25 DIAGNOSIS — S46011D Strain of muscle(s) and tendon(s) of the rotator cuff of right shoulder, subsequent encounter: Secondary | ICD-10-CM | POA: Diagnosis not present

## 2018-11-25 DIAGNOSIS — M7541 Impingement syndrome of right shoulder: Secondary | ICD-10-CM | POA: Diagnosis not present

## 2018-11-27 DIAGNOSIS — S46812D Strain of other muscles, fascia and tendons at shoulder and upper arm level, left arm, subsequent encounter: Secondary | ICD-10-CM | POA: Diagnosis not present

## 2018-11-27 DIAGNOSIS — M7541 Impingement syndrome of right shoulder: Secondary | ICD-10-CM | POA: Diagnosis not present

## 2018-11-27 DIAGNOSIS — S46011D Strain of muscle(s) and tendon(s) of the rotator cuff of right shoulder, subsequent encounter: Secondary | ICD-10-CM | POA: Diagnosis not present

## 2018-12-05 DIAGNOSIS — S46011D Strain of muscle(s) and tendon(s) of the rotator cuff of right shoulder, subsequent encounter: Secondary | ICD-10-CM | POA: Diagnosis not present

## 2018-12-05 DIAGNOSIS — S46812D Strain of other muscles, fascia and tendons at shoulder and upper arm level, left arm, subsequent encounter: Secondary | ICD-10-CM | POA: Diagnosis not present

## 2018-12-05 DIAGNOSIS — M7541 Impingement syndrome of right shoulder: Secondary | ICD-10-CM | POA: Diagnosis not present

## 2018-12-10 DIAGNOSIS — S46011D Strain of muscle(s) and tendon(s) of the rotator cuff of right shoulder, subsequent encounter: Secondary | ICD-10-CM | POA: Diagnosis not present

## 2018-12-10 DIAGNOSIS — S46812D Strain of other muscles, fascia and tendons at shoulder and upper arm level, left arm, subsequent encounter: Secondary | ICD-10-CM | POA: Diagnosis not present

## 2018-12-10 DIAGNOSIS — M7541 Impingement syndrome of right shoulder: Secondary | ICD-10-CM | POA: Diagnosis not present

## 2018-12-11 DIAGNOSIS — M67911 Unspecified disorder of synovium and tendon, right shoulder: Secondary | ICD-10-CM | POA: Diagnosis not present

## 2019-02-25 DIAGNOSIS — Z23 Encounter for immunization: Secondary | ICD-10-CM | POA: Diagnosis not present

## 2019-02-25 DIAGNOSIS — D225 Melanocytic nevi of trunk: Secondary | ICD-10-CM | POA: Diagnosis not present

## 2019-02-25 DIAGNOSIS — D485 Neoplasm of uncertain behavior of skin: Secondary | ICD-10-CM | POA: Diagnosis not present

## 2019-02-25 DIAGNOSIS — L570 Actinic keratosis: Secondary | ICD-10-CM | POA: Diagnosis not present

## 2019-02-25 DIAGNOSIS — L820 Inflamed seborrheic keratosis: Secondary | ICD-10-CM | POA: Diagnosis not present

## 2019-02-25 DIAGNOSIS — L84 Corns and callosities: Secondary | ICD-10-CM | POA: Diagnosis not present

## 2019-04-07 DIAGNOSIS — Z0000 Encounter for general adult medical examination without abnormal findings: Secondary | ICD-10-CM | POA: Diagnosis not present

## 2019-04-07 DIAGNOSIS — E559 Vitamin D deficiency, unspecified: Secondary | ICD-10-CM | POA: Diagnosis not present

## 2019-04-07 DIAGNOSIS — R7309 Other abnormal glucose: Secondary | ICD-10-CM | POA: Diagnosis not present

## 2019-04-07 DIAGNOSIS — R5383 Other fatigue: Secondary | ICD-10-CM | POA: Diagnosis not present

## 2019-04-21 DIAGNOSIS — E559 Vitamin D deficiency, unspecified: Secondary | ICD-10-CM | POA: Diagnosis not present

## 2019-04-21 DIAGNOSIS — Z0000 Encounter for general adult medical examination without abnormal findings: Secondary | ICD-10-CM | POA: Diagnosis not present

## 2019-04-21 DIAGNOSIS — R7303 Prediabetes: Secondary | ICD-10-CM | POA: Diagnosis not present

## 2019-04-21 DIAGNOSIS — Z23 Encounter for immunization: Secondary | ICD-10-CM | POA: Diagnosis not present

## 2019-05-29 ENCOUNTER — Encounter: Payer: Self-pay | Admitting: Obstetrics & Gynecology

## 2019-05-29 DIAGNOSIS — Z1231 Encounter for screening mammogram for malignant neoplasm of breast: Secondary | ICD-10-CM | POA: Diagnosis not present

## 2019-06-02 ENCOUNTER — Other Ambulatory Visit: Payer: Self-pay

## 2019-06-02 DIAGNOSIS — L570 Actinic keratosis: Secondary | ICD-10-CM | POA: Diagnosis not present

## 2019-06-02 DIAGNOSIS — D485 Neoplasm of uncertain behavior of skin: Secondary | ICD-10-CM | POA: Diagnosis not present

## 2019-06-02 DIAGNOSIS — L219 Seborrheic dermatitis, unspecified: Secondary | ICD-10-CM | POA: Diagnosis not present

## 2019-06-02 DIAGNOSIS — D229 Melanocytic nevi, unspecified: Secondary | ICD-10-CM | POA: Diagnosis not present

## 2019-06-02 DIAGNOSIS — L820 Inflamed seborrheic keratosis: Secondary | ICD-10-CM | POA: Diagnosis not present

## 2019-06-23 DIAGNOSIS — K5732 Diverticulitis of large intestine without perforation or abscess without bleeding: Secondary | ICD-10-CM | POA: Diagnosis not present

## 2019-06-23 DIAGNOSIS — K5792 Diverticulitis of intestine, part unspecified, without perforation or abscess without bleeding: Secondary | ICD-10-CM | POA: Insufficient documentation

## 2019-06-23 DIAGNOSIS — R1032 Left lower quadrant pain: Secondary | ICD-10-CM | POA: Diagnosis not present

## 2019-07-04 ENCOUNTER — Other Ambulatory Visit: Payer: Self-pay

## 2019-07-07 DIAGNOSIS — K5732 Diverticulitis of large intestine without perforation or abscess without bleeding: Secondary | ICD-10-CM | POA: Diagnosis not present

## 2019-07-08 ENCOUNTER — Other Ambulatory Visit: Payer: Self-pay

## 2019-07-08 ENCOUNTER — Encounter: Payer: Self-pay | Admitting: Obstetrics & Gynecology

## 2019-07-08 ENCOUNTER — Other Ambulatory Visit (HOSPITAL_COMMUNITY)
Admission: RE | Admit: 2019-07-08 | Discharge: 2019-07-08 | Disposition: A | Discharge status: Home / Self Care | Payer: Medicare Other | Source: Ambulatory Visit | Attending: Obstetrics & Gynecology | Admitting: Obstetrics & Gynecology

## 2019-07-08 ENCOUNTER — Ambulatory Visit (INDEPENDENT_AMBULATORY_CARE_PROVIDER_SITE_OTHER): Payer: Medicare Other | Admitting: Obstetrics & Gynecology

## 2019-07-08 VITALS — BP 124/70 | HR 76 | Temp 97.3°F | Resp 12 | Ht 58.5 in | Wt 218.0 lb

## 2019-07-08 DIAGNOSIS — Z124 Encounter for screening for malignant neoplasm of cervix: Secondary | ICD-10-CM | POA: Diagnosis not present

## 2019-07-08 DIAGNOSIS — Z01419 Encounter for gynecological examination (general) (routine) without abnormal findings: Secondary | ICD-10-CM

## 2019-07-08 NOTE — Progress Notes (Signed)
69 y.o. G33P2002 Married White or Caucasian female here for annual exam.  Doing well.  Denies vaginal bleeding.    Had shoulder issues last year.  Did PT and this has helped.    Had [place] on nose and had Mohs' surgery.  Has been working on weight loss.  PCP:  Provider in Dr. Pennie Banter office.  Has blood work done year.  Last appt was in December.  Has follow up in 3 months to have blood sugar recheck.  Patient's last menstrual period was 09/20/2011 (approximate).          Sexually active: No.  The current method of family planning is tubal ligation.    Exercising: Yes.    walking and weights Smoker:  no  Health Maintenance: Pap:  10-08-15 Neg:Neg HR HPV, 01-16-13 Neg:Neg HR HPV History of abnormal Pap:  no MMG:  05/29/19 BIRADS 1 negative/density b Colonoscopy:  2019 normal.  Repeat 5 years. BMD:   05/21/18 Osteopenia TDaP:  2017 Pneumonia vaccine(s):  completed Shingrix:   Per patient had one vaccine Hep C testing: 10-08-15 Neg Screening Labs: PCP   reports that she has never smoked. She has never used smokeless tobacco. She reports that she does not drink alcohol or use drugs.  Past Medical History:  Diagnosis Date  . Abnormal Pap smear of cervix    just a repeat done yrs ago  . Anemia   . 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 Diverticulosis   . Gallstones   . Gastritis    acute  . GERD (gastroesophageal reflux disease)   . Hiatal hernia     Past Surgical History:  Procedure Laterality Date  . BACK SURGERY  around age [age]  . BASAL CELL CARCINOMA EXCISION  04/24/2018   nose  . CHOLECYSTECTOMY    . KNEE SURGERY  2012   left knee  . TUBAL LIGATION  age 23    Current Outpatient Medications  Medication Sig Dispense Refill  . budesonide-formoterol (SYMBICORT) 160-4.5 MCG/ACT inhaler Inhale 1 puff into the lungs daily.    . Cholecalciferol (VITAMIN D PO) Take 6,000 Int'l Units by mouth daily.     . Homeopathic Products (ALLERGY MEDICINE PO) Take by mouth.    . hyoscyamine  (ANASPAZ) 0.125 MG TBDP tablet Place under the tongue as needed.    Marland Kitchen ketoconazole (NIZORAL) 2 % shampoo SMARTSIG:5 Milliliter(s) Topical Daily    . mometasone (ELOCON) 0.1 % lotion APPLY TO AFFECTED AREA TWICE A DAY FOR 14 DAYS    . Multiple Vitamins-Minerals (MULTIVITAMIN PO) Take by mouth. Women over 40 vitamin    . pantoprazole (PROTONIX) 40 MG tablet Take 40 mg by mouth daily.     No current facility-administered medications for this visit.    Family History  Adopted: Yes  Problem Relation Age of Onset  . Diabetes Mother   . Heart disease Mother     Review of Systems  All other systems reviewed and are negative.   Exam:   BP 124/70 (BP Location: Right Arm, Patient Position: Sitting, Cuff Size: Normal)   Pulse 76   Temp (!) 97.3 F (36.3 C) (Temporal)   Resp 12   Ht 4' 10.5" (1.486 m)   Wt 218 lb (98.9 kg)   LMP 09/20/2011 (Approximate)   BMI 44.79 kg/m     Height: 4' 10.5" (148.6 cm)  Ht Readings from Last 3 Encounters:  07/08/19 4' 10.5" (1.486 m)  05/20/18 4\' 11"  (1.499 m)  04/25/17 4' 10.75" (  1.492 m)   General appearance: alert, cooperative and appears stated age Head: Normocephalic, without obvious abnormality, atraumatic Neck: no adenopathy, supple, symmetrical, trachea midline and thyroid normal to inspection and palpation Lungs: clear to auscultation bilaterally Breasts: normal appearance, no masses or tenderness Heart: regular rate and rhythm Abdomen: soft, non-tender; bowel sounds normal; no masses,  no organomegaly Extremities: extremities normal, atraumatic, no cyanosis or edema Skin: Skin color, texture, turgor normal. No rashes or lesions Lymph nodes: Cervical, supraclavicular, and axillary nodes normal. No abnormal inguinal nodes palpated Neurologic: Grossly normal   Pelvic: External genitalia:  no lesions              Urethra:  normal appearing urethra with no masses, tenderness or lesions              Bartholins and Skenes: normal                  Vagina: normal appearing vagina with normal color and discharge, no lesions              Cervix: no lesions              Pap taken: Yes.   Bimanual Exam:  Uterus:  normal size, contour, position, consistency, mobility, non-tender              Adnexa: normal adnexa and no mass, fullness, tenderness               Rectovaginal: Confirms               Anus:  normal sphincter tone, no lesions  Chaperone, Terence Lux, CMA, was present for exam.  A:  Well Woman with normal exam PMP, no HRT H/o abnormal pap Obesity, working on weight loss  P:   Mammogram guidelines reviewed pap smear obtained Colonoscopy and BMD are UTD Is in the process of getting her Covid vaccine.  Knows to delay Shingrix vaccination until after Covid vaccination is complete.  Lab work done with Dr. Pennie Banter office return annually or prn

## 2019-07-09 LAB — CYTOLOGY - PAP: Diagnosis: NEGATIVE

## 2019-08-12 ENCOUNTER — Encounter: Payer: Self-pay | Admitting: Certified Nurse Midwife

## 2019-09-18 DIAGNOSIS — M1712 Unilateral primary osteoarthritis, left knee: Secondary | ICD-10-CM | POA: Diagnosis not present

## 2019-11-05 ENCOUNTER — Encounter: Payer: Self-pay | Admitting: Physician Assistant

## 2019-11-05 ENCOUNTER — Other Ambulatory Visit: Payer: Self-pay

## 2019-11-05 ENCOUNTER — Ambulatory Visit: Payer: Medicare Other | Admitting: Physician Assistant

## 2019-11-05 DIAGNOSIS — Z85828 Personal history of other malignant neoplasm of skin: Secondary | ICD-10-CM

## 2019-11-05 DIAGNOSIS — D485 Neoplasm of uncertain behavior of skin: Secondary | ICD-10-CM | POA: Diagnosis not present

## 2019-11-05 DIAGNOSIS — B079 Viral wart, unspecified: Secondary | ICD-10-CM

## 2019-11-05 DIAGNOSIS — L820 Inflamed seborrheic keratosis: Secondary | ICD-10-CM | POA: Diagnosis not present

## 2019-11-05 NOTE — Progress Notes (Signed)
   Follow up Visit  Subjective  Megan [name] is a 69 y.o. female who presents for the following: Skin Problem (biopsy on right breast in feb and still hasnt healed- " looks terrible"). Biopsied in January. She would also like her nose looked at. She recently had a large blackhead in the center. She also has a wart on her finger she has been treating with OTC wart remover but wants to have frozen. Objective  Well appearing patient in no apparent distress; mood and affect are within normal limits.  Face, chest, right hand examined. Relevant physical exam findings are noted in the Assessment and Plan.   Objective  Right Breast: Scar right breast from previous biospy has healed well. New thick keratotic lesion growing just adjacent to previous biopsy scar.      Objective  Right Hand - Anterior: Verrucous papule-- Discussed viral etiology and contagion.   Assessment & Plan  Neoplasm of uncertain behavior of skin Right Breast  Skin / nail biopsy Type of biopsy: tangential   Informed consent: discussed and consent obtained   Timeout: patient name, date of birth, surgical site, and procedure verified   Procedure prep:  Patient was prepped and draped in usual sterile fashion (Non sterile) Prep type:  Chlorhexidine Anesthesia: the lesion was anesthetized in a standard fashion   Anesthetic:  1% lidocaine w/ epinephrine 1-100,000 local infiltration Instrument used: flexible razor blade   Outcome: patient tolerated procedure well   Post-procedure details: wound care instructions given    Specimen 1 - Surgical pathology Differential Diagnosis: bcc vs scc Check Margins: No  Viral warts, unspecified type Right Hand - Anterior  Destruction of lesion - Right Hand - Anterior Complexity: simple   Destruction method: cryotherapy   Informed consent: discussed and consent obtained   Timeout:  patient name, date of birth, surgical site, and procedure verified Lesion destroyed using  liquid nitrogen: Yes   Outcome: patient tolerated procedure well with no complications    History of basal cell carcinoma (BCC) Right Tip of Nose

## 2019-11-05 NOTE — Patient Instructions (Signed)

## 2019-11-11 ENCOUNTER — Telehealth: Payer: Self-pay | Admitting: *Deleted

## 2019-11-11 NOTE — Telephone Encounter (Signed)
Pathology results to patient.  °

## 2019-11-13 DIAGNOSIS — R7303 Prediabetes: Secondary | ICD-10-CM | POA: Diagnosis not present

## 2019-11-19 DIAGNOSIS — K219 Gastro-esophageal reflux disease without esophagitis: Secondary | ICD-10-CM | POA: Diagnosis not present

## 2019-11-19 DIAGNOSIS — R7303 Prediabetes: Secondary | ICD-10-CM | POA: Diagnosis not present

## 2019-11-19 DIAGNOSIS — E559 Vitamin D deficiency, unspecified: Secondary | ICD-10-CM | POA: Diagnosis not present

## 2020-02-12 DIAGNOSIS — H5213 Myopia, bilateral: Secondary | ICD-10-CM | POA: Diagnosis not present

## 2020-02-12 DIAGNOSIS — H43393 Other vitreous opacities, bilateral: Secondary | ICD-10-CM | POA: Diagnosis not present

## 2020-02-12 DIAGNOSIS — H524 Presbyopia: Secondary | ICD-10-CM | POA: Diagnosis not present

## 2020-02-12 DIAGNOSIS — H43813 Vitreous degeneration, bilateral: Secondary | ICD-10-CM | POA: Diagnosis not present

## 2020-05-25 DIAGNOSIS — M720 Palmar fascial fibromatosis [Dupuytren]: Secondary | ICD-10-CM | POA: Diagnosis not present

## 2020-05-25 DIAGNOSIS — M25562 Pain in left knee: Secondary | ICD-10-CM | POA: Diagnosis not present

## 2020-05-25 DIAGNOSIS — M25552 Pain in left hip: Secondary | ICD-10-CM | POA: Diagnosis not present

## 2020-05-25 DIAGNOSIS — M7062 Trochanteric bursitis, left hip: Secondary | ICD-10-CM | POA: Diagnosis not present

## 2020-05-27 DIAGNOSIS — R7303 Prediabetes: Secondary | ICD-10-CM | POA: Diagnosis not present

## 2020-05-27 DIAGNOSIS — Z0000 Encounter for general adult medical examination without abnormal findings: Secondary | ICD-10-CM | POA: Diagnosis not present

## 2020-05-27 DIAGNOSIS — E7800 Pure hypercholesterolemia, unspecified: Secondary | ICD-10-CM | POA: Diagnosis not present

## 2020-05-27 DIAGNOSIS — E559 Vitamin D deficiency, unspecified: Secondary | ICD-10-CM | POA: Diagnosis not present

## 2020-05-31 ENCOUNTER — Ambulatory Visit: Payer: Medicare Other | Admitting: Physician Assistant

## 2020-06-03 DIAGNOSIS — Z0000 Encounter for general adult medical examination without abnormal findings: Secondary | ICD-10-CM | POA: Diagnosis not present

## 2020-06-03 DIAGNOSIS — D72829 Elevated white blood cell count, unspecified: Secondary | ICD-10-CM | POA: Diagnosis not present

## 2020-06-03 DIAGNOSIS — K219 Gastro-esophageal reflux disease without esophagitis: Secondary | ICD-10-CM | POA: Diagnosis not present

## 2020-06-03 DIAGNOSIS — Z780 Asymptomatic menopausal state: Secondary | ICD-10-CM | POA: Diagnosis not present

## 2020-06-03 DIAGNOSIS — J4530 Mild persistent asthma, uncomplicated: Secondary | ICD-10-CM | POA: Diagnosis not present

## 2020-06-03 DIAGNOSIS — L719 Rosacea, unspecified: Secondary | ICD-10-CM | POA: Diagnosis not present

## 2020-06-03 DIAGNOSIS — R7303 Prediabetes: Secondary | ICD-10-CM | POA: Diagnosis not present

## 2020-06-03 DIAGNOSIS — L219 Seborrheic dermatitis, unspecified: Secondary | ICD-10-CM | POA: Diagnosis not present

## 2020-06-03 DIAGNOSIS — K588 Other irritable bowel syndrome: Secondary | ICD-10-CM | POA: Diagnosis not present

## 2020-06-03 DIAGNOSIS — E559 Vitamin D deficiency, unspecified: Secondary | ICD-10-CM | POA: Diagnosis not present

## 2020-06-03 DIAGNOSIS — R202 Paresthesia of skin: Secondary | ICD-10-CM | POA: Diagnosis not present

## 2020-07-01 DIAGNOSIS — M25552 Pain in left hip: Secondary | ICD-10-CM | POA: Diagnosis not present

## 2020-07-01 DIAGNOSIS — M25562 Pain in left knee: Secondary | ICD-10-CM | POA: Diagnosis not present

## 2020-07-08 ENCOUNTER — Encounter: Payer: Self-pay | Admitting: Obstetrics & Gynecology

## 2020-07-08 DIAGNOSIS — Z1231 Encounter for screening mammogram for malignant neoplasm of breast: Secondary | ICD-10-CM | POA: Diagnosis not present

## 2020-07-12 DIAGNOSIS — K5730 Diverticulosis of large intestine without perforation or abscess without bleeding: Secondary | ICD-10-CM | POA: Diagnosis not present

## 2020-07-12 DIAGNOSIS — R1032 Left lower quadrant pain: Secondary | ICD-10-CM | POA: Diagnosis not present

## 2020-07-12 DIAGNOSIS — Z86010 Personal history of colonic polyps: Secondary | ICD-10-CM | POA: Diagnosis not present

## 2020-07-13 DIAGNOSIS — M25562 Pain in left knee: Secondary | ICD-10-CM | POA: Diagnosis not present

## 2020-07-13 DIAGNOSIS — R2689 Other abnormalities of gait and mobility: Secondary | ICD-10-CM | POA: Diagnosis not present

## 2020-07-13 DIAGNOSIS — M25552 Pain in left hip: Secondary | ICD-10-CM | POA: Diagnosis not present

## 2020-07-13 DIAGNOSIS — M6281 Muscle weakness (generalized): Secondary | ICD-10-CM | POA: Diagnosis not present

## 2020-07-21 DIAGNOSIS — M6281 Muscle weakness (generalized): Secondary | ICD-10-CM | POA: Diagnosis not present

## 2020-07-21 DIAGNOSIS — M25562 Pain in left knee: Secondary | ICD-10-CM | POA: Diagnosis not present

## 2020-07-21 DIAGNOSIS — M25552 Pain in left hip: Secondary | ICD-10-CM | POA: Diagnosis not present

## 2020-07-21 DIAGNOSIS — R2689 Other abnormalities of gait and mobility: Secondary | ICD-10-CM | POA: Diagnosis not present

## 2020-07-28 DIAGNOSIS — M25562 Pain in left knee: Secondary | ICD-10-CM | POA: Diagnosis not present

## 2020-07-28 DIAGNOSIS — M25552 Pain in left hip: Secondary | ICD-10-CM | POA: Diagnosis not present

## 2020-07-28 DIAGNOSIS — R2689 Other abnormalities of gait and mobility: Secondary | ICD-10-CM | POA: Diagnosis not present

## 2020-07-28 DIAGNOSIS — M6281 Muscle weakness (generalized): Secondary | ICD-10-CM | POA: Diagnosis not present

## 2020-08-03 DIAGNOSIS — M25562 Pain in left knee: Secondary | ICD-10-CM | POA: Diagnosis not present

## 2020-08-03 DIAGNOSIS — R2689 Other abnormalities of gait and mobility: Secondary | ICD-10-CM | POA: Diagnosis not present

## 2020-08-03 DIAGNOSIS — M25552 Pain in left hip: Secondary | ICD-10-CM | POA: Diagnosis not present

## 2020-08-03 DIAGNOSIS — M6281 Muscle weakness (generalized): Secondary | ICD-10-CM | POA: Diagnosis not present

## 2020-08-10 DIAGNOSIS — M25552 Pain in left hip: Secondary | ICD-10-CM | POA: Diagnosis not present

## 2020-08-10 DIAGNOSIS — M6281 Muscle weakness (generalized): Secondary | ICD-10-CM | POA: Diagnosis not present

## 2020-08-10 DIAGNOSIS — M25562 Pain in left knee: Secondary | ICD-10-CM | POA: Diagnosis not present

## 2020-08-10 DIAGNOSIS — R2689 Other abnormalities of gait and mobility: Secondary | ICD-10-CM | POA: Diagnosis not present

## 2020-08-19 DIAGNOSIS — K5730 Diverticulosis of large intestine without perforation or abscess without bleeding: Secondary | ICD-10-CM | POA: Diagnosis not present

## 2020-08-19 DIAGNOSIS — K635 Polyp of colon: Secondary | ICD-10-CM | POA: Diagnosis not present

## 2020-08-19 DIAGNOSIS — D123 Benign neoplasm of transverse colon: Secondary | ICD-10-CM | POA: Diagnosis not present

## 2020-08-19 DIAGNOSIS — Z1211 Encounter for screening for malignant neoplasm of colon: Secondary | ICD-10-CM | POA: Diagnosis not present

## 2020-08-19 DIAGNOSIS — Z86010 Personal history of colonic polyps: Secondary | ICD-10-CM | POA: Diagnosis not present

## 2020-08-19 DIAGNOSIS — D120 Benign neoplasm of cecum: Secondary | ICD-10-CM | POA: Diagnosis not present

## 2020-08-24 DIAGNOSIS — M25562 Pain in left knee: Secondary | ICD-10-CM | POA: Diagnosis not present

## 2020-08-24 DIAGNOSIS — M1612 Unilateral primary osteoarthritis, left hip: Secondary | ICD-10-CM | POA: Diagnosis not present

## 2020-08-24 DIAGNOSIS — M7062 Trochanteric bursitis, left hip: Secondary | ICD-10-CM | POA: Diagnosis not present

## 2020-08-24 DIAGNOSIS — M25511 Pain in right shoulder: Secondary | ICD-10-CM | POA: Diagnosis not present

## 2020-09-07 ENCOUNTER — Encounter (HOSPITAL_BASED_OUTPATIENT_CLINIC_OR_DEPARTMENT_OTHER): Payer: Self-pay | Admitting: Obstetrics & Gynecology

## 2020-09-07 ENCOUNTER — Ambulatory Visit (INDEPENDENT_AMBULATORY_CARE_PROVIDER_SITE_OTHER): Payer: Medicare Other | Admitting: Obstetrics & Gynecology

## 2020-09-07 ENCOUNTER — Other Ambulatory Visit: Payer: Self-pay

## 2020-09-07 VITALS — BP 140/78 | HR 72 | Ht <= 58 in | Wt 223.0 lb

## 2020-09-07 DIAGNOSIS — K449 Diaphragmatic hernia without obstruction or gangrene: Secondary | ICD-10-CM | POA: Insufficient documentation

## 2020-09-07 DIAGNOSIS — Z780 Asymptomatic menopausal state: Secondary | ICD-10-CM | POA: Diagnosis not present

## 2020-09-07 DIAGNOSIS — K589 Irritable bowel syndrome without diarrhea: Secondary | ICD-10-CM | POA: Insufficient documentation

## 2020-09-07 DIAGNOSIS — Z01419 Encounter for gynecological examination (general) (routine) without abnormal findings: Secondary | ICD-10-CM

## 2020-09-07 DIAGNOSIS — M6208 Separation of muscle (nontraumatic), other site: Secondary | ICD-10-CM | POA: Diagnosis not present

## 2020-09-07 DIAGNOSIS — M25562 Pain in left knee: Secondary | ICD-10-CM | POA: Diagnosis not present

## 2020-09-07 DIAGNOSIS — R2689 Other abnormalities of gait and mobility: Secondary | ICD-10-CM | POA: Diagnosis not present

## 2020-09-07 DIAGNOSIS — F419 Anxiety disorder, unspecified: Secondary | ICD-10-CM | POA: Insufficient documentation

## 2020-09-07 DIAGNOSIS — J45909 Unspecified asthma, uncomplicated: Secondary | ICD-10-CM | POA: Insufficient documentation

## 2020-09-07 DIAGNOSIS — E669 Obesity, unspecified: Secondary | ICD-10-CM | POA: Insufficient documentation

## 2020-09-07 DIAGNOSIS — M6281 Muscle weakness (generalized): Secondary | ICD-10-CM | POA: Diagnosis not present

## 2020-09-07 DIAGNOSIS — M25552 Pain in left hip: Secondary | ICD-10-CM | POA: Diagnosis not present

## 2020-09-07 MED ORDER — MOMETASONE FUROATE 0.1 % EX SOLN: CUTANEOUS | 1 refills | Status: AC

## 2020-09-07 NOTE — Progress Notes (Signed)
70 y.o. G85P2002 Married White or Caucasian female here for breast and pelvic exam.  Denies vaginal bleeding.  Has adopted her 41 yo grandson.  Has granddaughter most of the time as well.  Doing PT due to lateral fascia on left left.  This is causing her knee to hurt.  She is doing strengthening exercises.    Requests rx for mometasone.  Has used in the past.  Patient's last menstrual period was 09/20/2011 (approximate).          Sexually active: No.  H/O STD:  no  Health Maintenance: PCP:  Dr. Shelia Media.  Last wellness appt was 2022.  Did blood work at that appt:  Yes Vaccines are up to date:  yes Colonoscopy:  08/19/2020, Dr. Benson Norway MMG:  05/2020 BMD:  2019 Last pap smear:  06/2019, negative H/o abnormal pap smear:  Remote hx   reports that she has never smoked. She has never used smokeless tobacco. She reports that she does not drink alcohol and does not use drugs.  Past Medical History:  Diagnosis Date  . Abnormal Pap smear of cervix    just a repeat done yrs ago  . Anemia   . Basal cell carcinoma 02/07/2018   right nasal sidewall [place] MOHS  . Cancer ([place]) 04/24/2018   Nose  . Diverticulosis   . Gallstones   . Gastritis    acute  . GERD (gastroesophageal reflux disease)   . Hiatal hernia     Past Surgical History:  Procedure Laterality Date  . BACK SURGERY  around age [age]  . BASAL CELL CARCINOMA EXCISION  04/24/2018   nose  . CHOLECYSTECTOMY    . KNEE SURGERY  2012   left knee  . TUBAL LIGATION  age 37    Current Outpatient Medications  Medication Sig Dispense Refill  . budesonide-formoterol (SYMBICORT) 160-4.5 MCG/ACT inhaler Inhale 1 puff into the lungs daily.    . Cholecalciferol (VITAMIN D PO) Take 6,000 Int'l Units by mouth daily.     . Homeopathic Products (ALLERGY MEDICINE PO) Take by mouth.    . hyoscyamine (ANASPAZ) 0.125 MG TBDP tablet Place under the tongue as needed.    Marland Kitchen ketoconazole (NIZORAL) 2 % shampoo SMARTSIG:5 Milliliter(s) Topical Daily    .  Multiple Vitamins-Minerals (MULTIVITAMIN PO) Take by mouth. Women over 40 vitamin    . mometasone (ELOCON) 0.1 % lotion Apply topically two to three times weekly. 60 mL 1  . pantoprazole (PROTONIX) 40 MG tablet Take 40 mg by mouth daily. (Patient not taking: Reported on 09/07/2020)     No current facility-administered medications for this visit.    Family History  Adopted: Yes  Problem Relation Age of Onset  . Heart disease Mother     Review of Systems  All other systems reviewed and are negative.   Exam:   BP 140/78   Pulse 72   Ht 4\' 10"  (1.473 m)   Wt 223 lb (101.2 kg)   LMP 09/20/2011 (Approximate)   BMI 46.61 kg/m   Height: 4\' 10"  (147.3 cm)  General appearance: alert, cooperative and appears stated age Breasts: normal appearance, no masses or tenderness Abdomen: soft, non-tender; bowel sounds normal; no masses,  no organomegaly, abdominal diastasis noted Lymph nodes: Cervical, supraclavicular, and axillary nodes normal.  No abnormal inguinal nodes palpated Neurologic: Grossly normal  Pelvic: External genitalia:  no lesions              Urethra:  normal appearing urethra with no masses,  tenderness or lesions              Bartholins and Skenes: normal                 Vagina: normal appearing vagina with atrophic changes and no discharge, no lesions              Cervix: no lesions              Pap taken: No. Bimanual Exam:  Uterus:  normal size, contour, position, consistency, mobility, non-tender              Adnexa: normal adnexa and no mass, fullness, tenderness               Rectovaginal: Confirms               Anus:  normal sphincter tone, no lesions  Chaperone, Prince Rome, CMA, was present for exam.  Assessment/Plan: 1. Encntr for gyn exam (general) (routine) w/o abn findings - pap 2021.  Not indicated today. - MMG 2022 - BMD 2019 - lab work done with Dr. Shelia Media last year.  Will have appt this year. - vaccines updated - mometasone (ELOCON) 0.1 % lotion;  Apply topically two to three times weekly.  Dispense: 60 mL; Refill: 1  2. Morbid obesity ([place])  3. Rectus diastasis  4. Postmenopausal - no HRT

## 2020-10-05 DIAGNOSIS — M25552 Pain in left hip: Secondary | ICD-10-CM | POA: Diagnosis not present

## 2020-10-05 DIAGNOSIS — M25511 Pain in right shoulder: Secondary | ICD-10-CM | POA: Diagnosis not present

## 2020-10-05 DIAGNOSIS — M7062 Trochanteric bursitis, left hip: Secondary | ICD-10-CM | POA: Diagnosis not present

## 2020-10-05 DIAGNOSIS — M25562 Pain in left knee: Secondary | ICD-10-CM | POA: Diagnosis not present

## 2020-10-14 DIAGNOSIS — R2689 Other abnormalities of gait and mobility: Secondary | ICD-10-CM | POA: Diagnosis not present

## 2020-10-14 DIAGNOSIS — M6281 Muscle weakness (generalized): Secondary | ICD-10-CM | POA: Diagnosis not present

## 2020-10-14 DIAGNOSIS — M25562 Pain in left knee: Secondary | ICD-10-CM | POA: Diagnosis not present

## 2020-10-14 DIAGNOSIS — M25552 Pain in left hip: Secondary | ICD-10-CM | POA: Diagnosis not present

## 2020-11-30 DIAGNOSIS — M25511 Pain in right shoulder: Secondary | ICD-10-CM | POA: Diagnosis not present

## 2020-11-30 DIAGNOSIS — M1612 Unilateral primary osteoarthritis, left hip: Secondary | ICD-10-CM | POA: Diagnosis not present

## 2020-11-30 DIAGNOSIS — M1712 Unilateral primary osteoarthritis, left knee: Secondary | ICD-10-CM | POA: Diagnosis not present

## 2020-12-06 DIAGNOSIS — H00024 Hordeolum internum left upper eyelid: Secondary | ICD-10-CM | POA: Diagnosis not present

## 2020-12-15 ENCOUNTER — Other Ambulatory Visit: Payer: Self-pay

## 2020-12-15 ENCOUNTER — Ambulatory Visit: Payer: Medicare Other | Admitting: Dermatology

## 2020-12-15 DIAGNOSIS — Z1283 Encounter for screening for malignant neoplasm of skin: Secondary | ICD-10-CM

## 2020-12-15 DIAGNOSIS — L219 Seborrheic dermatitis, unspecified: Secondary | ICD-10-CM

## 2020-12-15 DIAGNOSIS — L821 Other seborrheic keratosis: Secondary | ICD-10-CM | POA: Diagnosis not present

## 2020-12-15 MED ORDER — KETOCONAZOLE 2 % EX CREA
1.0000 | TOPICAL_CREAM | Freq: Every day | CUTANEOUS | 11 refills | Status: AC
Start: 2020-12-15 — End: 2021-01-14

## 2020-12-15 MED ORDER — KETOCONAZOLE 2 % EX SHAM: MEDICATED_SHAMPOO | CUTANEOUS | 11 refills | Status: DC

## 2021-01-01 ENCOUNTER — Encounter: Payer: Self-pay | Admitting: Dermatology

## 2021-01-01 NOTE — Progress Notes (Signed)
    Follow-Up Visit   Subjective  Megan [name] is a 70 y.o. female who presents for the following: Annual Exam (Here for annual skin exam. Concerns nose pink bump. Patient does have history of BCC on nose. Also has bumps around face. Skin tag right abdomen area.).  General skin examination, several areas to check Location:  Duration:  Quality:  Associated Signs/Symptoms: Modifying Factors:  Severity:  Timing: Context:   Objective  Well appearing patient in no apparent distress; mood and affect are within normal limits. Head to toe Head to toe exam today no signs of atypical moles, melanoma or  non mole skin cancer.  Neither patient nor I could find any growth on nose today, but she will return if it recurs or grows.  Right Breast 1.8cm tan flattopped keratotic papule; dermoscopy typical     Scalp Patchy pink inflammatory scale    A full examination was performed including scalp, head, eyes, ears, nose, lips, neck, chest, axillae, abdomen, back, buttocks, bilateral upper extremities, bilateral lower extremities, hands, feet, fingers, toes, fingernails, and toenails. All findings within normal limits unless otherwise noted below.  Areas beneath undergarments not fully examined.   Assessment & Plan    Skin exam for malignant neoplasm Head to toe  Yearly skin check.  Encouraged to self examine twice annually.  Continue ultraviolet protection.  Seborrheic keratosis Right Breast  Benign okay to leave if stable.  Seborrheic dermatitis Scalp  History that ketoconazole shampoo and cream have helped.  So these can be refilled.  Contact me if there are no longer helping.  ketoconazole (NIZORAL) 2 % shampoo - Scalp Apply to scalp let sit for 3-5 minutes and then rinse.  ketoconazole (NIZORAL) 2 % cream - Scalp Apply 1 application topically daily.      I, Lavonna Monarch, MD, have reviewed all documentation for this visit.  The documentation on 01/01/21 for the  exam, diagnosis, procedures, and orders are all accurate and complete.

## 2021-01-11 DIAGNOSIS — M7062 Trochanteric bursitis, left hip: Secondary | ICD-10-CM | POA: Diagnosis not present

## 2021-01-11 DIAGNOSIS — M25562 Pain in left knee: Secondary | ICD-10-CM | POA: Diagnosis not present

## 2021-02-08 DIAGNOSIS — M1612 Unilateral primary osteoarthritis, left hip: Secondary | ICD-10-CM | POA: Diagnosis not present

## 2021-02-08 DIAGNOSIS — M25562 Pain in left knee: Secondary | ICD-10-CM | POA: Diagnosis not present

## 2021-02-08 DIAGNOSIS — M7062 Trochanteric bursitis, left hip: Secondary | ICD-10-CM | POA: Diagnosis not present

## 2021-02-09 ENCOUNTER — Other Ambulatory Visit: Payer: Self-pay | Admitting: Orthopedic Surgery

## 2021-02-09 DIAGNOSIS — M25562 Pain in left knee: Secondary | ICD-10-CM

## 2021-02-10 DIAGNOSIS — H25813 Combined forms of age-related cataract, bilateral: Secondary | ICD-10-CM | POA: Diagnosis not present

## 2021-02-10 DIAGNOSIS — H524 Presbyopia: Secondary | ICD-10-CM | POA: Diagnosis not present

## 2021-03-08 ENCOUNTER — Ambulatory Visit
Admission: RE | Admit: 2021-03-08 | Discharge: 2021-03-08 | Disposition: A | Discharge status: Home / Self Care | Payer: Medicare Other | Source: Ambulatory Visit | Attending: Orthopedic Surgery | Admitting: Orthopedic Surgery

## 2021-03-08 DIAGNOSIS — M25562 Pain in left knee: Secondary | ICD-10-CM

## 2021-03-14 DIAGNOSIS — M25552 Pain in left hip: Secondary | ICD-10-CM | POA: Diagnosis not present

## 2021-03-14 DIAGNOSIS — M25562 Pain in left knee: Secondary | ICD-10-CM | POA: Diagnosis not present

## 2021-05-20 DIAGNOSIS — E559 Vitamin D deficiency, unspecified: Secondary | ICD-10-CM | POA: Diagnosis not present

## 2021-05-20 DIAGNOSIS — K219 Gastro-esophageal reflux disease without esophagitis: Secondary | ICD-10-CM | POA: Diagnosis not present

## 2021-05-20 DIAGNOSIS — K5792 Diverticulitis of intestine, part unspecified, without perforation or abscess without bleeding: Secondary | ICD-10-CM | POA: Diagnosis not present

## 2021-05-20 DIAGNOSIS — K588 Other irritable bowel syndrome: Secondary | ICD-10-CM | POA: Diagnosis not present

## 2021-06-13 DIAGNOSIS — K5732 Diverticulitis of large intestine without perforation or abscess without bleeding: Secondary | ICD-10-CM | POA: Diagnosis not present

## 2021-06-13 DIAGNOSIS — R1032 Left lower quadrant pain: Secondary | ICD-10-CM | POA: Diagnosis not present

## 2021-06-15 DIAGNOSIS — G8918 Other acute postprocedural pain: Secondary | ICD-10-CM | POA: Diagnosis not present

## 2021-06-15 DIAGNOSIS — S83282A Other tear of lateral meniscus, current injury, left knee, initial encounter: Secondary | ICD-10-CM | POA: Diagnosis not present

## 2021-06-15 DIAGNOSIS — M94262 Chondromalacia, left knee: Secondary | ICD-10-CM | POA: Diagnosis not present

## 2021-06-15 DIAGNOSIS — S83232A Complex tear of medial meniscus, current injury, left knee, initial encounter: Secondary | ICD-10-CM | POA: Diagnosis not present

## 2021-06-15 DIAGNOSIS — S83272A Complex tear of lateral meniscus, current injury, left knee, initial encounter: Secondary | ICD-10-CM | POA: Diagnosis not present

## 2021-06-15 DIAGNOSIS — S83242A Other tear of medial meniscus, current injury, left knee, initial encounter: Secondary | ICD-10-CM | POA: Diagnosis not present

## 2021-06-15 DIAGNOSIS — M6752 Plica syndrome, left knee: Secondary | ICD-10-CM | POA: Diagnosis not present

## 2021-06-21 DIAGNOSIS — M25662 Stiffness of left knee, not elsewhere classified: Secondary | ICD-10-CM | POA: Diagnosis not present

## 2021-06-21 DIAGNOSIS — R531 Weakness: Secondary | ICD-10-CM | POA: Diagnosis not present

## 2021-06-27 DIAGNOSIS — R0781 Pleurodynia: Secondary | ICD-10-CM | POA: Diagnosis not present

## 2021-06-29 DIAGNOSIS — R0781 Pleurodynia: Secondary | ICD-10-CM | POA: Diagnosis not present

## 2021-07-01 DIAGNOSIS — R531 Weakness: Secondary | ICD-10-CM | POA: Diagnosis not present

## 2021-07-01 DIAGNOSIS — M25662 Stiffness of left knee, not elsewhere classified: Secondary | ICD-10-CM | POA: Diagnosis not present

## 2021-07-06 DIAGNOSIS — R531 Weakness: Secondary | ICD-10-CM | POA: Diagnosis not present

## 2021-07-06 DIAGNOSIS — M25662 Stiffness of left knee, not elsewhere classified: Secondary | ICD-10-CM | POA: Diagnosis not present

## 2021-08-01 DIAGNOSIS — Z0289 Encounter for other administrative examinations: Secondary | ICD-10-CM

## 2021-08-09 ENCOUNTER — Other Ambulatory Visit: Payer: Self-pay

## 2021-08-09 ENCOUNTER — Encounter (INDEPENDENT_AMBULATORY_CARE_PROVIDER_SITE_OTHER): Payer: Self-pay | Admitting: Family Medicine

## 2021-08-09 ENCOUNTER — Ambulatory Visit (INDEPENDENT_AMBULATORY_CARE_PROVIDER_SITE_OTHER): Payer: Medicare Other | Admitting: Family Medicine

## 2021-08-09 VITALS — BP 160/100 | HR 65 | Temp 98.5°F | Ht 59.0 in | Wt 224.0 lb

## 2021-08-09 DIAGNOSIS — E559 Vitamin D deficiency, unspecified: Secondary | ICD-10-CM | POA: Diagnosis not present

## 2021-08-09 DIAGNOSIS — R5383 Other fatigue: Secondary | ICD-10-CM | POA: Diagnosis not present

## 2021-08-09 DIAGNOSIS — E66813 Obesity, class 3: Secondary | ICD-10-CM

## 2021-08-09 DIAGNOSIS — R0602 Shortness of breath: Secondary | ICD-10-CM

## 2021-08-09 DIAGNOSIS — R030 Elevated blood-pressure reading, without diagnosis of hypertension: Secondary | ICD-10-CM

## 2021-08-09 DIAGNOSIS — E669 Obesity, unspecified: Secondary | ICD-10-CM | POA: Diagnosis not present

## 2021-08-09 DIAGNOSIS — Z6841 Body Mass Index (BMI) 40.0 and over, adult: Secondary | ICD-10-CM

## 2021-08-09 DIAGNOSIS — Z1331 Encounter for screening for depression: Secondary | ICD-10-CM

## 2021-08-09 DIAGNOSIS — F5089 Other specified eating disorder: Secondary | ICD-10-CM

## 2021-08-09 DIAGNOSIS — K219 Gastro-esophageal reflux disease without esophagitis: Secondary | ICD-10-CM

## 2021-08-09 DIAGNOSIS — K5900 Constipation, unspecified: Secondary | ICD-10-CM | POA: Diagnosis not present
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
## 2021-08-10 LAB — HEMOGLOBIN A1C
Est. average glucose Bld gHb Est-mCnc: 137 mg/dL
Hgb A1c MFr Bld: 6.4 % — ABNORMAL HIGH (ref 4.8–5.6)

## 2021-08-10 LAB — LIPID PANEL WITH LDL/HDL RATIO
Cholesterol, Total: 210 mg/dL — ABNORMAL HIGH (ref 100–199)
HDL: 48 mg/dL (ref >39)
LDL Chol Calc (NIH): 140 mg/dL — ABNORMAL HIGH (ref 0–99)
LDL/HDL Ratio: 2.9 ratio (ref 0.0–3.2)
Triglycerides: 121 mg/dL (ref 0–149)
VLDL Cholesterol Cal: 22 mg/dL (ref 5–40)

## 2021-08-10 LAB — CBC WITH DIFFERENTIAL/PLATELET
Basophils Absolute: 0 10*3/uL (ref 0.0–0.2)
Basos: 1 %
EOS (ABSOLUTE): 0.1 10*3/uL (ref 0.0–0.4)
Eos: 2 %
Hematocrit: 45.8 % (ref 34.0–46.6)
Hemoglobin: 14.7 g/dL (ref 11.1–15.9)
Immature Grans (Abs): 0 10*3/uL (ref 0.0–0.1)
Immature Granulocytes: 0 %
Lymphocytes Absolute: 1.3 10*3/uL (ref 0.7–3.1)
Lymphs: 23 %
MCH: 27.1 pg (ref 26.6–33.0)
MCHC: 32.1 g/dL (ref 31.5–35.7)
MCV: 85 fL (ref 79–97)
Monocytes Absolute: 0.4 10*3/uL (ref 0.1–0.9)
Monocytes: 7 %
Neutrophils Absolute: 3.9 10*3/uL (ref 1.4–7.0)
Neutrophils: 67 %
Platelets: 255 10*3/uL (ref 150–450)
RBC: 5.42 x10E6/uL — ABNORMAL HIGH (ref 3.77–5.28)
RDW: 14 % (ref 11.7–15.4)
WBC: 5.7 10*3/uL (ref 3.4–10.8)

## 2021-08-10 LAB — FOLATE: Folate: 11.5 ng/mL (ref >3.0)

## 2021-08-10 LAB — TSH: TSH: 1.73 u[IU]/mL (ref 0.450–4.500)

## 2021-08-10 LAB — COMPREHENSIVE METABOLIC PANEL
ALT: 19 IU/L (ref 0–32)
AST: 15 IU/L (ref 0–40)
Albumin/Globulin Ratio: 1.5 (ref 1.2–2.2)
Albumin: 4.1 g/dL (ref 3.8–4.8)
Alkaline Phosphatase: 87 IU/L (ref 44–121)
BUN/Creatinine Ratio: 21 (ref 12–28)
BUN: 13 mg/dL (ref 8–27)
Bilirubin Total: 0.2 mg/dL (ref 0.0–1.2)
CO2: 25 mmol/L (ref 20–29)
Calcium: 9.7 mg/dL (ref 8.7–10.3)
Chloride: 104 mmol/L (ref 96–106)
Creatinine, Ser: 0.62 mg/dL (ref 0.57–1.00)
Globulin, Total: 2.7 g/dL (ref 1.5–4.5)
Glucose: 111 mg/dL — ABNORMAL HIGH (ref 70–99)
Potassium: 4.7 mmol/L (ref 3.5–5.2)
Sodium: 143 mmol/L (ref 134–144)
Total Protein: 6.8 g/dL (ref 6.0–8.5)
eGFR: 97 mL/min/{1.73_m2} (ref >59)

## 2021-08-10 LAB — T4, FREE: Free T4: 1.19 ng/dL (ref 0.82–1.77)

## 2021-08-10 LAB — VITAMIN D 25 HYDROXY (VIT D DEFICIENCY, FRACTURES): Vit D, 25-Hydroxy: 26.1 ng/mL — ABNORMAL LOW (ref 30.0–100.0)

## 2021-08-10 LAB — INSULIN, RANDOM: INSULIN: 29.6 u[IU]/mL — ABNORMAL HIGH (ref 2.6–24.9)

## 2021-08-10 LAB — VITAMIN B12: Vitamin B-12: 483 pg/mL (ref 232–1245)

## 2021-08-16 ENCOUNTER — Telehealth (INDEPENDENT_AMBULATORY_CARE_PROVIDER_SITE_OTHER): Payer: Medicare Other | Admitting: Psychology

## 2021-08-16 DIAGNOSIS — F5089 Other specified eating disorder: Secondary | ICD-10-CM | POA: Diagnosis not present

## 2021-08-16 NOTE — Progress Notes (Signed)
?Office: [phone]  /  Fax: [phone] ? ? ? ?Date: 08/30/2021   ?Appointment Start Time: 2:36pm ?Duration: 30 minutes ?Provider: [name], Psy.D. ?Type of Session: Individual Therapy  ?Location of Patient: Home (private location) ?Location of Provider: Provider's Home (private office) ?Type of Contact: Telepsychological Visit via MyChart Video Visit ? ?Session Content: This provider called Megan [name] at 2:33pm as she did not present for today's appointment. Assistance as provided. As such, today's appointment was initiated 6 minutes late. Megan [name] is a 71 y.o. female presenting for a follow-up appointment to address the previously established treatment goal of increasing coping skills.Today's appointment was a telepsychological visit due to COVID-19. Megan [name] provided verbal consent for today's telepsychological appointment and she is aware she is responsible for securing confidentiality on her end of the session. Prior to proceeding with today's appointment, Megan [name]'s physical location at the time of this appointment was obtained as well a phone number she could be reached at in the event of technical difficulties. Megan [name] and this provider participated in today's telepsychological service.  ? ?This provider conducted a brief check-in. Megan [name] reported she lost seven pounds the first two weeks. Notably, she was encouraged to stop weighing herself at home by Dr. Raliegh Scarlet. She continues to follow her structured meal plan, but expressed concern about the food options that will be available at a funeral she will be attending in her hometown. Psychoeducation regarding making better choices and engaging in portion control during gatherings/ holidays/ celebrations was provided. More specifically, this provider discussed the following strategies: coming to meals hungry, but not starving; avoid filling up on appetizers; managing portion sizes; not completely depriving yourself; making the plate colorful (e.g., vegetables);  pacing yourself (e.g., waiting 10 minutes before going back for seconds); taking advantage of the nutritious foods; practicing mindfulness; staying hydrated; and avoid bringing home leftovers. Moreover, Megan [name] discussed experiencing "anxiety" about her appearance as family/friends back home have not seen her in years. To assist with coping, psychoeducation was provided regarding grounding techniques. She was engaged in a Scientist, clinical (histocompatibility and immunogenetics) (5-4-3-2-1) and her experience was processed. Megan [name] provided verbal consent during today's appointment for this provider to send a handout for today's exercise via e-mail. Overall, Megan [name] was receptive to today's appointment as evidenced by openness to sharing, responsiveness to feedback, and willingness to implement discussed strategies . ? ?Mental Status Examination:  ?Appearance: neat ?Behavior: appropriate to circumstances ?Mood: neutral ?Affect: mood congruent ?Speech: WNL ?Eye Contact: appropriate ?Psychomotor Activity: WNL ?Gait: unable to assess ?Thought Process: linear, logical, and goal directed and no evidence or endorsement of suicidal, homicidal, and self-harm ideation, plan and intent  ?Thought Content/Perception: no hallucinations, delusions, bizarre thinking or behavior endorsed or observed ?Orientation: AAOx4 ?Memory/Concentration: memory, attention, language, and fund of knowledge intact  ?Insight: good ?Judgment: good ? ?Interventions:  ?Conducted a brief chart review ?Provided empathic reflections and validation ?Employed supportive psychotherapy interventions to facilitate reduced distress and to improve coping skills with identified stressors ?Psychoeducation provided regarding strategies for gatherings/holidays/vacations ?Psychoeducation provided regarding grounding techniques ?Engaged pt in a grounding exercise ? ?DSM-5 Diagnosis(es): F50.89 Other Specified Feeding or Eating Disorder, Emotional Eating Behaviors ? ?Treatment Goal & Progress: During the  initial appointment with this provider, the following treatment goal was established: increase coping skills. Progress is limited, as Megan [name] has just begun treatment with this provider; however, she is receptive to the interaction and interventions and rapport is being established.  ? ?Plan: Based on appointment availability and progress to date per Megan [name]'s self-report, the next appointment will  be scheduled in three weeks, which will be via MyChart Video Visit. The next session will focus on working towards the established treatment goal. ? ?

## 2021-08-16 NOTE — Progress Notes (Signed)
? ? ? ? ?Chief Complaint:  ? ?OBESITY ?Megan [name] (MR# [id number]) is a 71 y.o. female who presents for evaluation and treatment of obesity and related comorbidities. Current Body mass index is 45.24 kg/m?Marland Kitchen Megan [name] has been struggling with her weight for many years and has been unsuccessful in either losing weight, maintaining weight loss, or reaching her healthy weight goal. ? ?Megan [name] has 82 and 48 year old grandchildren who live with her and her husband.  She skips breakfast most days, but feels she eats too  much.  She is scared she is going to be hungry on a diet.  Dr. Berenice Primas of Ortho referred her here for weight loss.  She has left knee and left hip pain.  Her PCP is at [facility]. ? ?Megan [name] is currently in the action stage of change and ready to dedicate time achieving and maintaining a healthier weight. Megan [name] is interested in becoming our patient and working on intensive lifestyle modifications including (but not limited to) diet and exercise for weight loss. ? ?Megan [name]'s habits were reviewed today and are as follows: Her family eats meals together, she thinks her family will eat healthier with her, she struggles with family and or coworkers weight loss sabotage, her desired weight loss is 85 lbs, she has been heavy most of her life, she started gaining weight 10 years ago, her heaviest weight ever was 235 pounds, she craves crunchy foods - chips, salty, pup food, she skips breakfast frequently, she is frequently drinking liquids with calories, she frequently makes poor food choices, she frequently eats larger portions than normal, and she struggles with emotional eating. ? ?Depression Screen ?Megan [name]'s Food and Mood (modified PHQ-9) score was 5. ? ? ?  08/09/2021  ?  8:58 AM  ?Depression screen PHQ 2/9  ?Decreased Interest 1  ?Down, Depressed, Hopeless 0  ?PHQ - 2 Score 1  ?Altered sleeping 1  ?Tired, decreased energy 1  ?Change in appetite 1  ?Feeling bad or failure about yourself  1   ?Trouble concentrating 0  ?Moving slowly or fidgety/restless 0  ?Suicidal thoughts 0  ?PHQ-9 Score 5  ?Difficult doing work/chores Not difficult at all  ? ?Subjective:  ? ?1. Other fatigue/SOB ?Megan [name] admits to daytime somnolence and reports waking up still tired. Patient has a history of symptoms of daytime fatigue, morning fatigue, and snoring. Megan [name] generally gets 6 or 7 hours of sleep per night, and states that she has generally restful sleep. Snoring is present. Apneic episodes are not present. Epworth Sleepiness Score is 12.  Megan [name] was tested for OSA 2-3 years ago and was negative for sleep apneal.  ? ?2. Shortness of breath on exertion ?Megan [name] notes increasing shortness of breath with exercising and seems to be worsening over time with weight gain. She notes getting out of breath sooner with activity than she used to. This has gotten worse recently. Megan [name] denies shortness of breath at rest or orthopnea. ? ?3. Gastroesophageal reflux disease, unspecified whether esophagitis present ?Symptoms well controlled currently.  She is taking Protonix.  Positive history of IBS.  She sees GI. ? ?4. Elevated blood pressure reading in office without diagnosis of hypertension ?No history of hypertension.  She has anxiety with cuff tightness and gets PTSD with BP checks.  She is asymptotic.  No concerns. ? ?5. Vitamin D deficiency ?OTC supplementation has been sparce.  Not taking regularly for several months. When she takes it, she takes OTC 6000 IU daily. ? ?6. Constipation, unspecified constipation type ?"I've  had slow bowels my whole life."  Sees Dr. Benson Norway of GI.  No symptoms or issues for 2-3 years.now. ? 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 ? ?8. Screening for depression ?Megan [name] was screened for depression as part of her new patient workup today.  PHQ-9 is 5. ?Assessment/Plan:  ? ?Orders Placed This Encounter  ?Procedures  ? Hemoglobin A1c  ? Folate   ? Comprehensive metabolic panel  ? CBC with Differential/Platelet  ? Vitamin B12  ? Insulin, random  ? Lipid Panel With LDL/HDL Ratio  ? T4, free  ? TSH  ? VITAMIN D 25 Hydroxy (Vit-D Deficiency, Fractures)  ? EKG 12-Lead  ? ? ?Medications Discontinued During This Encounter  ?Medication Reason  ? Homeopathic Products (ALLERGY MEDICINE PO)   ? pantoprazole (PROTONIX) 40 MG tablet   ? cyclobenzaprine (FLEXERIL) 10 MG tablet   ?  ? ?No orders of the defined types were placed in this encounter. ?  ? ?1. Other fatigue ?Megan [name] does feel that her weight is causing her energy to be lower than it should be. Fatigue may be related to obesity, depression or many other causes. Labs will be ordered, and in the meanwhile, Megan [name] will focus on self care including making healthy food choices, increasing physical activity and focusing on stress reduction.  Check labs, IC, ECG.  Sleep hygiene discussed with patient and I encouraged 7-9 hours of sleep per night. ? ?- Hemoglobin A1c ?- Folate ?- EKG 12-Lead ?- CBC with Differential/Platelet ?- Vitamin B12 ?- Insulin, random ?- Lipid Panel With LDL/HDL Ratio ?- T4, free ?- TSH ? ?2. Shortness of breath on exertion ?Megan [name] does feel that she gets out of breath more easily that she used to when she exercises. [name]'s shortness of breath appears to be obesity related and exercise induced. She has agreed to work on weight loss and gradually increase exercise to treat her exercise induced shortness of breath. Will continue to monitor closely. ? ?3. Gastroesophageal reflux disease, unspecified whether esophagitis present ?Continue medication.  Avoid trigger foods.  Follow prudent nutritional plan.  Check labs. ? ?- Comprehensive metabolic panel ?- Lipid Panel With LDL/HDL Ratio ? ?4. Elevated blood pressure reading in office without diagnosis of hypertension ?Long history of well controlled BPs at home and other doctor's offices.  Will follow closely - 2 weeks.  Check labs. ? ?5. Vitamin D  deficiency ?Check labs.  Continue same treatment plana dn will modify if needed. ? ?- Lipid Panel With LDL/HDL Ratio ?- VITAMIN D 25 Hydroxy (Vit-D Deficiency, Fractures) ? ?6. Constipation, unspecified constipation type ?Check labs.  Adequate water intake.  Will monitor for symptoms.  Follow-up with GI per their recommendations. ? ?- Lipid Panel With LDL/HDL Ratio ? ?7. Other disorder of eating with emotional eating ?Patient was referred to Dr. Mallie Mussel, our Bariatric Psychologist, for evaluation due to her elevated PHQ-9 score and significant struggles with emotional eating. Will follow closely. ? ?8. Screening for depression ?Reagen had a positive depression screening. Depression is commonly associated with obesity and often results in emotional eating behaviors. We will monitor this closely and work on CBT to help improve the non-hunger eating patterns. Referral to Psychology may be required if no improvement is seen as she continues in our clinic. ? ?9. Obesity with current BMI of 45.4 ? ?Zanobia is currently in the action stage of change and her goal is to continue with weight loss efforts. I recommend  Fujie begin the structured treatment plan as follows: ? ?She has agreed to the Category 2 Plan. ? ?Exercise goals:  As is.   ? ?Behavioral modification strategies: no skipping meals, meal planning and cooking strategies, and planning for success. ? ?She was informed of the importance of frequent follow-up visits to maximize her success with intensive lifestyle modifications for her multiple health conditions. She was informed we would discuss her lab results at her next visit unless there is a critical issue that needs to be addressed sooner. Yanilen agreed to keep her next visit at the agreed upon time to discuss these results. ? ?Objective:  ? ?Blood pressure (!) 160/100, pulse 65, temperature 98.5 ?F (36.9 ?C), height '4\' 11"'$  (1.499 m), weight 224 lb (101.6 kg), last menstrual period 09/20/2011, SpO2 95 %. Body  mass index is 45.24 kg/m?. ? ?EKG: Normal sinus rhythm, rate 63 bpm. ? ?Indirect Calorimeter completed today shows a VO2 of 257 and a REE of 1771.  Her calculated basal metabolic rate is 8115 thus her bas

## 2021-08-24 ENCOUNTER — Ambulatory Visit (INDEPENDENT_AMBULATORY_CARE_PROVIDER_SITE_OTHER): Payer: Medicare Other | Admitting: Family Medicine

## 2021-08-24 ENCOUNTER — Encounter (INDEPENDENT_AMBULATORY_CARE_PROVIDER_SITE_OTHER): Payer: Self-pay | Admitting: Family Medicine

## 2021-08-24 VITALS — BP 146/68 | HR 69 | Temp 98.3°F | Ht 59.0 in | Wt 217.0 lb

## 2021-08-24 DIAGNOSIS — Z6841 Body Mass Index (BMI) 40.0 and over, adult: Secondary | ICD-10-CM

## 2021-08-24 DIAGNOSIS — E669 Obesity, unspecified: Secondary | ICD-10-CM

## 2021-08-24 DIAGNOSIS — R030 Elevated blood-pressure reading, without diagnosis of hypertension: Secondary | ICD-10-CM

## 2021-08-24 DIAGNOSIS — E7841 Elevated Lipoprotein(a): Secondary | ICD-10-CM

## 2021-08-24 DIAGNOSIS — F509 Eating disorder, unspecified: Secondary | ICD-10-CM

## 2021-08-24 DIAGNOSIS — E559 Vitamin D deficiency, unspecified: Secondary | ICD-10-CM | POA: Diagnosis not present

## 2021-08-24 DIAGNOSIS — K5900 Constipation, unspecified: Secondary | ICD-10-CM | POA: Diagnosis not present

## 2021-08-24 DIAGNOSIS — R7303 Prediabetes: Secondary | ICD-10-CM | POA: Diagnosis not present

## 2021-08-27 DIAGNOSIS — F509 Eating disorder, unspecified: Secondary | ICD-10-CM | POA: Insufficient documentation

## 2021-08-27 DIAGNOSIS — E7841 Elevated Lipoprotein(a): Secondary | ICD-10-CM | POA: Insufficient documentation

## 2021-08-27 DIAGNOSIS — R7303 Prediabetes: Secondary | ICD-10-CM | POA: Insufficient documentation

## 2021-08-27 DIAGNOSIS — E66812 Obesity, class 2: Secondary | ICD-10-CM | POA: Insufficient documentation

## 2021-08-27 DIAGNOSIS — R030 Elevated blood-pressure reading, without diagnosis of hypertension: Secondary | ICD-10-CM | POA: Insufficient documentation

## 2021-08-29 NOTE — Progress Notes (Signed)
? ? ? ?Chief Complaint:  ? ?OBESITY ?Megan [name] is here to discuss her progress with her obesity treatment plan along with follow-up of her obesity related diagnoses. Megan [name] is on the Category 2 Plan and states she is following her eating plan approximately 97-98% of the time. Megan [name] states she is walking 6,000-7,000 steps 7 times per week.   ? ?Today's visit was #: 2 ?Starting weight: 224 lbs ?Starting date: 08/09/2021 ?Today's weight: 217 lbs ?Today's date: 08/24/2021 ?Total lbs lost to date: 7 ?Total lbs lost since last in-office visit: 7 ? ?Interim History: Megan [name] is here today for her first follow-up office visit since starting the program with [place].  All blood work/ lab tests that were recently ordered by myself or an outside provider were reviewed with patient today per their request.   Extended time was spent counseling her on all new disease processes that were discovered or preexisting ones that are affected by BMI.  she understands that many of these abnormalities will need to monitored regularly along with the current treatment plan of prudent dietary changes, in which we are making each and every office visit, to improve these health parameters. Megan [name] had some cravings for salty crunchy after dinner, and 1 time in the afternoon. She missed meat at breakfast.  ? ?Subjective:  ? ?1. Pre-diabetes ?New diagnosis. Megan [name] denies a history of pre-diabetes or insulin resistance. A1c is 6.4 and insulin 29.6. I discussed labs with the patient today. ? ?2. Vitamin D deficiency ?Megan [name] is taking 6,000 IU daily OTC but only taking it 10% of the time in the past couple of months. I discussed labs with the patient today.  ? ?3. Constipation, unspecified constipation type ?Megan [name]'s symptoms are well controlled currently. She is not on medications.  ? ?4. HLD- Elevated lipoprotein(a) ?New diagnosis. Megan [name]'s LDL is elevated at 140. Triglycerides and HDL are within normal limits. She denies a history of cholesterol  ABN in the past. No supplementations. I discussed labs with the patient today.  ? ?5. Elevated blood pressure reading in office without diagnosis of hypertension; d/t white coat syndrome ?Megan [name] did not check her blood pressure at home. She is with tremendous anxiety in reaction to blood pressure cuff insufflation due to PTSD. She denies a history of hypertension. I discussed labs with the patient today.  ? ?6. Eating disorder- emotional eating ?Megan [name] was seen by Dr. Mallie Mussel recently. Setup to see her again. She denies hunger but has emotional cravings and is an emotional eater.  ? ?Assessment/Plan:  ?No orders of the defined types were placed in this encounter. ? ? ?There are no discontinued medications.  ? ?No orders of the defined types were placed in this encounter. ?  ? ?1. Pre-diabetes ?Handouts were given to insulin resistance and pre-diabetes after extensive discussion. Megan [name] will continue her prudent nutritional plan, and decrease simple carbohydrates. We will recheck labs in 3 months.  ? ?2. Vitamin D deficiency ?Not at goal. - I discussed the importance of vitamin D to the patient's health and well-being.  ?- I reviewed possible symptoms of low Vitamin D:  low energy, depressed mood, muscle aches, joint aches, osteoporosis etc. was reviewed with patient ?- low Vitamin D levels may be linked to an increased risk of cardiovascular events and even increased risk of cancers- such as colon and breast.  ?- ideal vitamin D levels reviewed with patient  ?- I recommend pt take 6,000 IU daily OTC. And we will recheck labs in 3 months.  ?-  Informed patient this may be a lifelong thing, and she was encouraged to continue to take the medicine until told otherwise.    ?- weight loss will likely improve availability of vitamin D, thus encouraged Megan [name] to continue with meal plan and their weight loss efforts to further improve this condition.  Thus, we will need to monitor levels regularly (every 3-4 mo on average) to  keep levels within normal limits and prevent over supplementation. ?- pt's questions and concerns regarding this condition addressed. ? ?3. Constipation, unspecified constipation type ?Megan [name] will continue to increase fiber and water intake to continue 100 oz of water per day. ? ?4. HLD- Elevated lipoprotein(a) ?With her blood pressure being elevated today, Megan [name]'s ASCVD risk score indicates the need for statin. She will discuss medication management with her PCP in the near future. She will continue her prudent nutritional plan.  ? ?5. Elevated blood pressure reading in office without diagnosis of hypertension; d/t white coat syndrome ?Megan [name] needs to check her blood pressure at home every other day or so and bring in her log to her next office visit. She is to decrease salt intake and work on weight loss via her prudent nutritional plan.  ? ?6. Eating disorder- emotional eating ?Handouts on mindful eating was given to the patient today. Education was provided and counseling was done. She will continue to follow up with Dr. Mallie Mussel.  ? ?7. Obesity with current BMI of 43.8 ?Megan [name] is currently in the action stage of change. As such, her goal is to continue with weight loss efforts. She has agreed to the Category 2 Plan with breakfast options.  ? ?I discouraged the patient to weigh herself at home. ? ?Exercise goals: As is. ? ?Behavioral modification strategies: increasing lean protein intake, decreasing simple carbohydrates, and planning for success. ? ?Megan [name] has agreed to follow-up with our clinic in 2 to 3 weeks. She was informed of the importance of frequent follow-up visits to maximize her success with intensive lifestyle modifications for her multiple health conditions.  ? ?Objective:  ? ?Blood pressure (!) 146/68, pulse 69, temperature 98.3 ?F (36.8 ?C), height '4\' 11"'$  (1.499 m), weight 217 lb (98.4 kg), last menstrual period 09/20/2011, SpO2 96 %. ?Body mass index is 43.83 kg/m?. ? ?General: Cooperative,  alert, well developed, in no acute distress. ?HEENT: Conjunctivae and lids unremarkable. ?Cardiovascular: Regular rhythm.  ?Lungs: Normal work of breathing. ?Neurologic: No focal deficits.  ? ?Lab Results  ?Component Value Date  ? CREATININE 0.62 08/09/2021  ? BUN 13 08/09/2021  ? NA 143 08/09/2021  ? K 4.7 08/09/2021  ? CL 104 08/09/2021  ? CO2 25 08/09/2021  ? ?Lab Results  ?Component Value Date  ? ALT 19 08/09/2021  ? AST 15 08/09/2021  ? ALKPHOS 87 08/09/2021  ? BILITOT 0.2 08/09/2021  ? ?Lab Results  ?Component Value Date  ? HGBA1C 6.4 (H) 08/09/2021  ? ?Lab Results  ?Component Value Date  ? INSULIN 29.6 (H) 08/09/2021  ? ?Lab Results  ?Component Value Date  ? TSH 1.730 08/09/2021  ? ?Lab Results  ?Component Value Date  ? CHOL 210 (H) 08/09/2021  ? HDL 48 08/09/2021  ? LDLCALC 140 (H) 08/09/2021  ? TRIG 121 08/09/2021  ? ?Lab Results  ?Component Value Date  ? VD25OH 26.1 (L) 08/09/2021  ? VD25OH 26 (L) 10/08/2015  ? ?Lab Results  ?Component Value Date  ? WBC 5.7 08/09/2021  ? HGB 14.7 08/09/2021  ? HCT 45.8 08/09/2021  ? MCV 85 08/09/2021  ?  PLT 255 08/09/2021  ? ?No results found for: IRON, TIBC, FERRITIN ? ?Obesity Behavioral Intervention:  ? ?Approximately 15 minutes were spent on the discussion below. ? ?ASK: ?We discussed the diagnosis of obesity with [name] today and Bentlee agreed to give [place] permission to discuss obesity behavioral modification therapy today. ? ?ASSESS: ?Waneda has the diagnosis of obesity and her BMI today is 43.9. Lorriane is in the action stage of change.  ? ?ADVISE: ?Shakari was educated on the multiple health risks of obesity as well as the benefit of weight loss to improve her health. She was advised of the need for long term treatment and the importance of lifestyle modifications to improve her current health and to decrease her risk of future health problems. ? ?AGREE: ?Multiple dietary modification options and treatment options were discussed and Denean agreed to follow the  recommendations documented in the above note. ? ?ARRANGE: ?Charlene was educated on the importance of frequent visits to treat obesity as outlined per CMS and USPSTF guidelines and agreed to schedule her next follow

## 2021-08-30 ENCOUNTER — Telehealth (INDEPENDENT_AMBULATORY_CARE_PROVIDER_SITE_OTHER): Payer: Medicare Other | Admitting: Psychology

## 2021-08-30 DIAGNOSIS — F5089 Other specified eating disorder: Secondary | ICD-10-CM

## 2021-09-06 NOTE — Progress Notes (Signed)
?  Office: [phone]  /  Fax: [phone] ? ? ? ?Date: 09/20/2021   ?Appointment Start Time: 9:34am ?Duration: 31 minutes ?Provider: [name], Psy.D. ?Type of Session: Individual Therapy  ?Location of Patient: Home (private location) ?Location of Provider: Provider's Home (private office) ?Type of Contact: Telepsychological Visit via MyChart Video Visit ? ?Session Content: Stephany is a 71 y.o. female presenting for a follow-up appointment to address the previously established treatment goal of increasing coping skills.Today's appointment was a telepsychological visit due to COVID-19. [name] provided verbal consent for today's telepsychological appointment and she is aware she is responsible for securing confidentiality on her end of the session. Prior to proceeding with today's appointment, Amethyst's physical location at the time of this appointment was obtained as well a phone number she could be reached at in the event of technical difficulties. Ethel and this provider participated in today's telepsychological service.  ? ?This provider conducted a brief check-in. Aya shared about the recent funeral she attended, noting, "It was good to see everyone." She shared she engaged in the grounding exercise previously shared to assist with coping. Positive reinforcement was provided. Chassie also discussed making better choices and engaging in portion control while traveling. She also discussed a reduction in emotional eating behaviors. Moreover, Rhyen discussed increased blood pressure at her last visit with the clinic and shared about memories of childhood incidents that were triggered contributing to increased anxiety. To help cope, psychoeducation was provided regarding relaxation techniques. She was engaged in a guided imagery exercise. Her experience was processed. Discussed utilizing YouTube for additional guided imagery exercises. This provider recommended she contact her PCP if she notices an increase in  her blood pressure; she agreed. Overall, Teruko was receptive to today's appointment as evidenced by openness to sharing, responsiveness to feedback, and willingness to continue engaging in learned skills. ? ?Mental Status Examination:  ?Appearance: neat ?Behavior: appropriate to circumstances ?Mood: neutral ?Affect: mood congruent ?Speech: WNL ?Eye Contact: appropriate ?Psychomotor Activity: WNL ?Gait: unable to assess ?Thought Process: linear, logical, and goal directed and no evidence or endorsement of suicidal, homicidal, and self-harm ideation, plan and intent  ?Thought Content/Perception: no hallucinations, delusions, bizarre thinking or behavior endorsed or observed ?Orientation: AAOx4 ?Memory/Concentration: memory, attention, language, and fund of knowledge intact  ?Insight: good ?Judgment: good ? ?Interventions:  ?Conducted a brief chart review ?Provided empathic reflections and validation ?Provided positive reinforcement ?Employed supportive psychotherapy interventions to facilitate reduced distress and to improve coping skills with identified stressors ?Psychoeducation provided regarding guided imagery ?Engaged patient in a guided imagery exercise ? ?DSM-5 Diagnosis(es): F50.89 Other Specified Feeding or Eating Disorder, Emotional Eating Behaviors ? ?Treatment Goal & Progress: During the initial appointment with this provider, the following treatment goal was established: increase coping skills. Kharter has demonstrated progress in her goal as evidenced by increased awareness of hunger patterns. Lilygrace also continues to demonstrate willingness to engage in learned skill(s). ? ?Plan: Based on progress to date per Amamda's self-report and her request, the next appointment is scheduled for 10/18/2021 at 10am, which will be via [facility]. The next session will focus on working towards the established treatment goal and  possible termination . ? ?

## 2021-09-07 ENCOUNTER — Encounter (INDEPENDENT_AMBULATORY_CARE_PROVIDER_SITE_OTHER): Payer: Self-pay | Admitting: Physician Assistant

## 2021-09-07 ENCOUNTER — Ambulatory Visit (INDEPENDENT_AMBULATORY_CARE_PROVIDER_SITE_OTHER): Payer: Medicare Other | Admitting: Physician Assistant

## 2021-09-07 VITALS — BP 150/90 | Ht 59.0 in | Wt 217.0 lb

## 2021-09-07 DIAGNOSIS — E669 Obesity, unspecified: Secondary | ICD-10-CM | POA: Diagnosis not present

## 2021-09-07 DIAGNOSIS — R030 Elevated blood-pressure reading, without diagnosis of hypertension: Secondary | ICD-10-CM

## 2021-09-07 DIAGNOSIS — Z6841 Body Mass Index (BMI) 40.0 and over, adult: Secondary | ICD-10-CM

## 2021-09-12 DIAGNOSIS — I10 Essential (primary) hypertension: Secondary | ICD-10-CM | POA: Diagnosis not present

## 2021-09-19 NOTE — Progress Notes (Signed)
? ? ? ?Chief Complaint:  ? ?OBESITY ?Megan [name] is here to discuss her progress with her obesity treatment plan along with follow-up of her obesity related diagnoses. Megan [name] is on the Category 2 Plan/ with breakfast options and states she is following her eating plan approximately 95% of the time. Megan [name] states she is walking 5,000 steps a day, 2 times per week. ? ?Today's visit was #: 3 ?Starting weight: 224 lbs ?Starting date: 08/09/2021 ?Today's weight: 217 lbs ?Today's date: 09/07/2021 ?Total lbs lost to date: 7 ?Total lbs lost since last in-office visit: 0 ? ?Interim History: Megan [name] reports "fighting" like crazy to stay on plan. She had cake on her birthday and got back on plan immediately afterwards. She attended a funeral and did the best she could with food choices. She notes that she feels better when eating on plan.  ? ?Subjective:  ? ?1. Elevated blood pressure reading in office without diagnosis of hypertension; d/t white coat syndrome ?Megan [name] does not check blood pressure at home. Thinks she may have white coat syndrome. Megan [name] also reports confrontation with daughter prior to her visit.  She does not want to be on medicine. Denies chest pain, chest pressure and headache. ? ?Assessment/Plan:  ? ?1. Elevated blood pressure reading in office without diagnosis of hypertension; d/t white coat syndrome ?Megan [name] will check her blood pressure at home and bring it in. ? ?2. Obesity with current BMI of 43.8 ?Megan [name] is currently in the action stage of change. As such, her goal is to continue with weight loss efforts. She has agreed to the Category 2 Plan with breakfast options. ? ?Exercise goals: As is. ? ?Behavioral modification strategies: meal planning and cooking strategies. ? ?Megan [name] has agreed to follow-up with our clinic in 2 weeks. She was informed of the importance of frequent follow-up visits to maximize her success with intensive lifestyle modifications for her multiple health conditions.  ? ?Objective:   ? ?Blood pressure (!) 150/90, height '4\' 11"'$  (1.499 m), weight 217 lb (98.4 kg), last menstrual period 09/20/2011. ?Body mass index is 43.83 kg/m?. ? ?General: Cooperative, alert, well developed, in no acute distress. ?HEENT: Conjunctivae and lids unremarkable. ?Cardiovascular: Regular rhythm.  ?Lungs: Normal work of breathing. ?Neurologic: No focal deficits.  ? ?Lab Results  ?Component Value Date  ? CREATININE 0.62 08/09/2021  ? BUN 13 08/09/2021  ? NA 143 08/09/2021  ? K 4.7 08/09/2021  ? CL 104 08/09/2021  ? CO2 25 08/09/2021  ? ?Lab Results  ?Component Value Date  ? ALT 19 08/09/2021  ? AST 15 08/09/2021  ? ALKPHOS 87 08/09/2021  ? BILITOT 0.2 08/09/2021  ? ?Lab Results  ?Component Value Date  ? HGBA1C 6.4 (H) 08/09/2021  ? ?Lab Results  ?Component Value Date  ? INSULIN 29.6 (H) 08/09/2021  ? ?Lab Results  ?Component Value Date  ? TSH 1.730 08/09/2021  ? ?Lab Results  ?Component Value Date  ? CHOL 210 (H) 08/09/2021  ? HDL 48 08/09/2021  ? LDLCALC 140 (H) 08/09/2021  ? TRIG 121 08/09/2021  ? ?Lab Results  ?Component Value Date  ? VD25OH 26.1 (L) 08/09/2021  ? VD25OH 26 (L) 10/08/2015  ? ?Lab Results  ?Component Value Date  ? WBC 5.7 08/09/2021  ? HGB 14.7 08/09/2021  ? HCT 45.8 08/09/2021  ? MCV 85 08/09/2021  ? PLT 255 08/09/2021  ? ?No results found for: IRON, TIBC, FERRITIN ? ?Obesity Behavioral Intervention:  ? ?Approximately 15 minutes were spent on the discussion below. ? ?ASK: ?  We discussed the diagnosis of obesity with Megan [name] today and Megan [name] agreed to give [place] permission to discuss obesity behavioral modification therapy today. ? ?ASSESS: ?Megan [name] has the diagnosis of obesity and her BMI today is 43.8. Megan [name] is in the action stage of change.  ? ?ADVISE: ?Megan [name] was educated on the multiple health risks of obesity as well as the benefit of weight loss to improve her health. She was advised of the need for long term treatment and the importance of lifestyle modifications to improve her current health and  to decrease her risk of future health problems. ? ?AGREE: ?Multiple dietary modification options and treatment options were discussed and Megan [name] agreed to follow the recommendations documented in the above note. ? ?ARRANGE: ?Megan [name] was educated on the importance of frequent visits to treat obesity as outlined per CMS and USPSTF guidelines and agreed to schedule her next follow up appointment today. ? ?Attestation Statements:  ? ?Reviewed by clinician on day of visit: allergies, medications, problem list, medical history, surgical history, family history, social history, and previous encounter notes. ? ?I, Brendell Tyus, am acting as transcriptionist for [employer], PA-C. ? ?I have reviewed the above documentation for accuracy and completeness, and I agree with the above. Abby Potash, PA-C ? ?

## 2021-09-20 ENCOUNTER — Telehealth (INDEPENDENT_AMBULATORY_CARE_PROVIDER_SITE_OTHER): Payer: Medicare Other | Admitting: Psychology

## 2021-09-20 DIAGNOSIS — F5089 Other specified eating disorder: Secondary | ICD-10-CM | POA: Diagnosis not present

## 2021-09-21 DIAGNOSIS — I10 Essential (primary) hypertension: Secondary | ICD-10-CM | POA: Diagnosis not present

## 2021-09-27 ENCOUNTER — Encounter (INDEPENDENT_AMBULATORY_CARE_PROVIDER_SITE_OTHER): Payer: Self-pay | Admitting: Family Medicine

## 2021-09-27 ENCOUNTER — Ambulatory Visit (INDEPENDENT_AMBULATORY_CARE_PROVIDER_SITE_OTHER): Payer: Medicare Other | Admitting: Family Medicine

## 2021-09-27 VITALS — BP 146/75 | HR 65 | Temp 98.3°F | Ht 59.0 in | Wt 212.0 lb

## 2021-09-27 DIAGNOSIS — R7303 Prediabetes: Secondary | ICD-10-CM

## 2021-09-27 DIAGNOSIS — I10 Essential (primary) hypertension: Secondary | ICD-10-CM | POA: Diagnosis not present

## 2021-09-27 DIAGNOSIS — Z9189 Other specified personal risk factors, not elsewhere classified: Secondary | ICD-10-CM

## 2021-09-27 DIAGNOSIS — Z6841 Body Mass Index (BMI) 40.0 and over, adult: Secondary | ICD-10-CM | POA: Diagnosis not present

## 2021-09-27 DIAGNOSIS — E669 Obesity, unspecified: Secondary | ICD-10-CM

## 2021-09-27 MED ORDER — METFORMIN HCL 500 MG PO TABS: ORAL_TABLET | ORAL | 0 refills | Status: DC

## 2021-10-03 DIAGNOSIS — Z1231 Encounter for screening mammogram for malignant neoplasm of breast: Secondary | ICD-10-CM | POA: Diagnosis not present

## 2021-10-04 ENCOUNTER — Encounter (HOSPITAL_BASED_OUTPATIENT_CLINIC_OR_DEPARTMENT_OTHER): Payer: Self-pay | Admitting: *Deleted

## 2021-10-04 DIAGNOSIS — I10 Essential (primary) hypertension: Secondary | ICD-10-CM | POA: Diagnosis not present

## 2021-10-04 NOTE — Progress Notes (Signed)
  Office: [phone]  /  Fax: [phone]    Date: 10/18/2021   Appointment Start Time: 10:03am Duration: 34 minutes [name]: Megan [name], Psy.D. Type of Session: Individual Therapy  Location of Patient: Home (private location) Location of [name]: Provider's Home (private office) Type of Contact: Telepsychological Visit via MyChart Video Visit  Session Content: Megan [name] is a 71 y.o. female presenting for a follow-up appointment to address the previously established treatment goal of increasing coping skills.Today's appointment was a telepsychological visit due to COVID-19. Megan [name] provided verbal consent for today's telepsychological appointment and she is aware she is responsible for securing confidentiality on her end of the session. Prior to proceeding with today's appointment, Megan [name]'s physical location at the time of this appointment was obtained as well a phone number she could be reached at in the event of technical difficulties. Megan [name] participated in today's telepsychological service.   Per [facility] new policy, Megan [name] will be e-mailed two additional forms (AOB and Receipt of NPP) to sign as well as Megan [name]'s Notice of Privacy Practices. The content of the documents were explained to [name] at the onset of the appointment, and she agreed to proceed. Additionally, she agreed to complete the forms and return them prior to the next appointment with this [name].   This [name] conducted a brief check-in. Megan [name] shared, "I've lost some weight." She denied engagement in emotional eating behaviors. Discussed the consequences of weighing at home daily. Megan [name] and this [name] discussed other ways to measure success aside from the number on the scale (e.g., clothes fitting differently, increased energy, increased physical activity). Additionally, Megan [name] was engaged in problem solving to develop a plan to help cope with urges/cravings in the future involving activities to  relax, activities to distract, comforting places, people to call and connect with, and activities that help soothe senses. She was observed writing the plan. Furthermore, termination planning was discussed. Megan [name] was receptive to a follow-up appointment in 3-4 weeks and an additional follow-up/termination appointment in 3-4 weeks after that. Overall, Megan [name] was receptive to today's appointment as evidenced by openness to sharing, responsiveness to feedback, and willingness to implement discussed strategies .  Mental Status Examination:  Appearance: neat Behavior: appropriate to circumstances Mood: neutral Affect: mood congruent Speech: WNL Eye Contact: appropriate Psychomotor Activity: WNL Gait: unable to assess Thought Process: linear, logical, and goal directed and no evidence or endorsement of suicidal, homicidal, and self-harm ideation, plan and intent  Thought Content/Perception: no hallucinations, delusions, bizarre thinking or behavior endorsed or observed Orientation: AAOx4 Memory/Concentration: memory, attention, language, and fund of knowledge intact  Insight: good Judgment: good  Interventions:  Conducted a brief chart review Provided empathic reflections and validation Employed supportive psychotherapy interventions to facilitate reduced distress and to improve coping skills with identified stressors Engaged patient in problem solving Discussed termination planning  DSM-5 Diagnosis(es): F50.89 Other Specified Feeding or Eating Disorder, Emotional Eating Behaviors  Treatment Goal & Progress: During the initial appointment with this [name], the following treatment goal was established: increase coping skills. Megan [name] has demonstrated progress in her goal as evidenced by increased awareness of hunger patterns and reduction in emotional eating behaviors . Megan [name] also continues to demonstrate willingness to engage in learned skill(s).  Plan: The next appointment is scheduled for  11/15/2021 at 10am, which will be via MyChart Video Visit. The next session will focus on working towards the established treatment goal. Additionally, Megan [name] will complete and return forms.

## 2021-10-05 ENCOUNTER — Ambulatory Visit (INDEPENDENT_AMBULATORY_CARE_PROVIDER_SITE_OTHER): Payer: Medicare Other | Admitting: Family Medicine

## 2021-10-05 ENCOUNTER — Encounter (INDEPENDENT_AMBULATORY_CARE_PROVIDER_SITE_OTHER): Payer: Self-pay | Admitting: Family Medicine

## 2021-10-05 VITALS — BP 147/82 | HR 66 | Temp 98.0°F | Ht 59.0 in | Wt 211.0 lb

## 2021-10-05 DIAGNOSIS — Z6841 Body Mass Index (BMI) 40.0 and over, adult: Secondary | ICD-10-CM

## 2021-10-05 DIAGNOSIS — R7303 Prediabetes: Secondary | ICD-10-CM | POA: Diagnosis not present

## 2021-10-05 DIAGNOSIS — E669 Obesity, unspecified: Secondary | ICD-10-CM | POA: Diagnosis not present

## 2021-10-09 NOTE — Progress Notes (Unsigned)
Chief Complaint:   OBESITY Megan [name] is here to discuss her progress with her obesity treatment plan along with follow-up of her obesity related diagnoses. Megan [name] is on the Category 2 Plan and states she is following her eating plan approximately 98% of the time. Megan [name] states she is at the [facility] getting 7,000-11,000 step 2 times per week.  Today's visit was #: 4 Starting weight: 224 lbs Starting date: 08/09/2021 Today's weight: 212 lbs Today's date: 09/27/2021 Total lbs lost to date: 12 lbs Total lbs lost since last in-office visit: 5 lbs  Interim History: Megan [name] is doing very well. She occasionally still has cravings, but not to badly. However, she is interested in medications to help her with her cravings.  Subjective:   1. Prediabetes Megan [name] has a diagnosis of prediabetes based on her elevated HgA1c and was informed this puts her at greater risk of developing diabetes. She continues to work on diet and exercise to decrease her risk of diabetes. She denies nausea or hypoglycemia.  Megan [name] is positive for some carb cravings and little extra snacking than allotted.  2. Benign essential hypertension Megan [name] is recently working with her PCP on her blood pressure, they are actively increasing her dosage of Losartan - HCTZ.  3. At risk for constipation Megan [name] is at increased risk for constipation due to increased protein intake. Megan [name] denies hard, infrequent stools currently.    Assessment/Plan:  No orders of the defined types were placed in this encounter.   There are no discontinued medications.   Meds ordered this encounter  Medications   metFORMIN (GLUCOPHAGE) 500 MG tablet    Sig: 1/2 tab po with lunch daily    Dispense:  30 tablet    Refill:  0     1. Prediabetes Megan [name] will start a low dosage of Metformin, 250 mg with lunch daily. Handout of medication was provided with extensive discussion about the riskd and benefits.  - metFORMIN (GLUCOPHAGE) 500 MG tablet; 1/2 tab  po with lunch daily  Dispense: 30 tablet; Refill: 0  2. Benign essential hypertension Megan [name]'s blood pressure is still above goal today. She will continue mediations per her PCP and decrease her salt intake by following the meal plan.  3. At risk for constipation Megan [name] was given approximately 15 minutes of counseling today regarding prevention of constipation. She was encouraged to increase water and fiber intake.    4. Obesity with current BMI of 43.0 Megan [name] is currently in the action stage of change. As such, her goal is to continue with weight loss efforts. She has agreed to the Category 2 Plan.   Exercise goals: As is.  Behavioral modification strategies: increasing lean protein intake and decreasing simple carbohydrates.  Megan [name] has agreed to follow-up with our clinic in 3 weeks. She was informed of the importance of frequent follow-up visits to maximize her success with intensive lifestyle modifications for her multiple health conditions.   Objective:   Blood pressure (!) 146/75, pulse 65, temperature 98.3 F (36.8 C), temperature source Oral, height '4\' 11"'$  (1.499 m), weight 212 lb (96.2 kg), last menstrual period 09/20/2011, SpO2 94 %. Body mass index is 42.82 kg/m.  General: Cooperative, alert, well developed, in no acute distress. HEENT: Conjunctivae and lids unremarkable. Cardiovascular: Regular rhythm.  Lungs: Normal work of breathing. Neurologic: No focal deficits.   Lab Results  Component Value Date   CREATININE 0.62 08/09/2021   BUN 13 08/09/2021   NA 143 08/09/2021   K 4.7 08/09/2021  CL 104 08/09/2021   CO2 25 08/09/2021   Lab Results  Component Value Date   ALT 19 08/09/2021   AST 15 08/09/2021   ALKPHOS 87 08/09/2021   BILITOT 0.2 08/09/2021   Lab Results  Component Value Date   HGBA1C 6.4 (H) 08/09/2021   Lab Results  Component Value Date   INSULIN 29.6 (H) 08/09/2021   Lab Results  Component Value Date   TSH 1.730 08/09/2021   Lab  Results  Component Value Date   CHOL 210 (H) 08/09/2021   HDL 48 08/09/2021   LDLCALC 140 (H) 08/09/2021   TRIG 121 08/09/2021   Lab Results  Component Value Date   VD25OH 26.1 (L) 08/09/2021   VD25OH 26 (L) 10/08/2015   Lab Results  Component Value Date   WBC 5.7 08/09/2021   HGB 14.7 08/09/2021   HCT 45.8 08/09/2021   MCV 85 08/09/2021   PLT 255 08/09/2021   No results found for: IRON, TIBC, FERRITIN  Attestation Statements:   Reviewed by clinician on day of visit: allergies, medications, problem list, medical history, surgical history, family history, social history, and previous encounter notes.  ILennette Bihari, CMA, am acting as transcriptionist for Dr. Raliegh Scarlet, DO.  I have reviewed the above documentation for accuracy and completeness, and I agree with the above. Marjory Sneddon, D.O.  The [place] was signed into law in 2016 which includes the topic of electronic health records.  This provides immediate access to information in MyChart.  This includes consultation notes, operative notes, office notes, lab results and pathology reports.  If you have any questions about what you read please let us know at your next visit so we can discuss your concerns and take corrective action if need be.  We are right here with you.

## 2021-10-14 NOTE — Progress Notes (Unsigned)
Chief Complaint:   OBESITY Megan [name] is here to discuss her progress with her obesity treatment plan along with follow-up of her obesity related diagnoses. Megan [name] is on the Category 2 Plan and states she is following her eating plan approximately 98% of the time. Megan [name] states she is not currently exercising.  Today's visit was #: 5 Starting weight: 224 lbs Starting date: 08/09/2021 Today's weight: 211 lbs Today's date: 10/05/2021 Total lbs lost to date: 15 lbs Total lbs lost since last in-office visit: 1 lb  Interim History: Megan [name] has several questions about eating out, what to eat out. She still has hunger (a little), after eating lunch, around 4 pm, and before dinner. Megan [name] started Metformin after her last office visit and wanted close follow up, thus, came in today.  Subjective:   1. Pre-diabetes It was discussed at last office visit that Megan [name] start Metformin 500 mg 1/2 tab with lunch. She is tolerating it okay, but, she declines to increase to 500 mg with lunch.  Assessment/Plan:  No orders of the defined types were placed in this encounter.   There are no discontinued medications.   No orders of the defined types were placed in this encounter.    1. Pre-diabetes Megan [name] will continue 250 mg Metformin, decrease simple carbs, and increase her protein intake.  2. Obesity, Current BMI 42.7 Megan [name] is currently in the action stage of change. As such, her goal is to continue with weight loss efforts. She has agreed to the Category 2 Plan with  6 oz at lunch.  Megan [name] was given the "Eating Out Guide". She will increase to 6 oz of protein at lunch, with lunch of 200-300 calories with 20 grams protein option.  Exercise goals: Megan [name] will walk 10 minutes every day, or every other day.  Behavioral modification strategies: increasing lean protein intake, decreasing simple carbohydrates, increasing water intake, and planning for success.  Megan [name] has agreed to follow-up with our  clinic in 2 weeks. She was informed of the importance of frequent follow-up visits to maximize her success with intensive lifestyle modifications for her multiple health conditions.   Objective:   Blood pressure (!) 147/82, pulse 66, temperature 98 F (36.7 C), height '4\' 11"'$  (1.499 m), weight 211 lb (95.7 kg), last menstrual period 09/20/2011, SpO2 92 %. Body mass index is 42.62 kg/m.  General: Cooperative, alert, well developed, in no acute distress. HEENT: Conjunctivae and lids unremarkable. Cardiovascular: Regular rhythm.  Lungs: Normal work of breathing. Neurologic: No focal deficits.   Lab Results  Component Value Date   CREATININE 0.62 08/09/2021   BUN 13 08/09/2021   NA 143 08/09/2021   K 4.7 08/09/2021   CL 104 08/09/2021   CO2 25 08/09/2021   Lab Results  Component Value Date   ALT 19 08/09/2021   AST 15 08/09/2021   ALKPHOS 87 08/09/2021   BILITOT 0.2 08/09/2021   Lab Results  Component Value Date   HGBA1C 6.4 (H) 08/09/2021   Lab Results  Component Value Date   INSULIN 29.6 (H) 08/09/2021   Lab Results  Component Value Date   TSH 1.730 08/09/2021   Lab Results  Component Value Date   CHOL 210 (H) 08/09/2021   HDL 48 08/09/2021   LDLCALC 140 (H) 08/09/2021   TRIG 121 08/09/2021   Lab Results  Component Value Date   VD25OH 26.1 (L) 08/09/2021   VD25OH 26 (L) 10/08/2015   Lab Results  Component Value Date   WBC 5.7  08/09/2021   HGB 14.7 08/09/2021   HCT 45.8 08/09/2021   MCV 85 08/09/2021   PLT 255 08/09/2021   No results found for: IRON, TIBC, FERRITIN  Obesity Behavioral Intervention:   Approximately 15 minutes were spent on the discussion below.  ASK: We discussed the diagnosis of obesity with Megan [name] today and Megan [name] agreed to give [place] permission to discuss obesity behavioral modification therapy today.  ASSESS: Megan [name] has the diagnosis of obesity and her BMI today is 42.7. Megan [name] is in the action stage of change.   ADVISE: Wilsie  was educated on the multiple health risks of obesity as well as the benefit of weight loss to improve her health. She was advised of the need for long term treatment and the importance of lifestyle modifications to improve her current health and to decrease her risk of future health problems.  AGREE: Multiple dietary modification options and treatment options were discussed and Brae agreed to follow the recommendations documented in the above note.  ARRANGE: Jomaira was educated on the importance of frequent visits to treat obesity as outlined per CMS and USPSTF guidelines and agreed to schedule her next follow up appointment today.  Attestation Statements:   Reviewed by clinician on day of visit: allergies, medications, problem list, medical history, surgical history, family history, social history, and previous encounter notes.  Time spent on visit including pre-visit chart review and post-visit care and charting was 20 minutes.   ILennette Bihari, CMA, am acting as transcriptionist for Dr. Raliegh Scarlet, DO.  I have reviewed the above documentation for accuracy and completeness, and I agree with the above. Marjory Sneddon, D.O.  The [place] was signed into law in 2016 which includes the topic of electronic health records.  This provides immediate access to information in MyChart.  This includes consultation notes, operative notes, office notes, lab results and pathology reports.  If you have any questions about what you read please let us know at your next visit so we can discuss your concerns and take corrective action if need be.  We are right here with you.

## 2021-10-18 ENCOUNTER — Telehealth (INDEPENDENT_AMBULATORY_CARE_PROVIDER_SITE_OTHER): Payer: Medicare Other | Admitting: Psychology

## 2021-10-18 DIAGNOSIS — F5089 Other specified eating disorder: Secondary | ICD-10-CM | POA: Diagnosis not present

## 2021-10-24 ENCOUNTER — Ambulatory Visit (INDEPENDENT_AMBULATORY_CARE_PROVIDER_SITE_OTHER): Payer: Medicare Other | Admitting: Family Medicine

## 2021-10-24 ENCOUNTER — Encounter (INDEPENDENT_AMBULATORY_CARE_PROVIDER_SITE_OTHER): Payer: Self-pay | Admitting: Family Medicine

## 2021-10-24 VITALS — BP 129/72 | HR 56 | Temp 98.5°F | Ht 59.0 in | Wt 208.0 lb

## 2021-10-24 DIAGNOSIS — R7303 Prediabetes: Secondary | ICD-10-CM | POA: Diagnosis not present

## 2021-10-24 DIAGNOSIS — E669 Obesity, unspecified: Secondary | ICD-10-CM | POA: Diagnosis not present

## 2021-10-24 DIAGNOSIS — I10 Essential (primary) hypertension: Secondary | ICD-10-CM

## 2021-10-24 DIAGNOSIS — Z7984 Long term (current) use of oral hypoglycemic drugs: Secondary | ICD-10-CM | POA: Diagnosis not present

## 2021-10-24 DIAGNOSIS — E559 Vitamin D deficiency, unspecified: Secondary | ICD-10-CM

## 2021-10-24 DIAGNOSIS — Z6841 Body Mass Index (BMI) 40.0 and over, adult: Secondary | ICD-10-CM

## 2021-10-25 ENCOUNTER — Other Ambulatory Visit: Payer: Self-pay | Admitting: Physician Assistant

## 2021-10-29 NOTE — Progress Notes (Signed)
Chief Complaint:   OBESITY Megan [name] is here to discuss her progress with her obesity treatment plan along with follow-up of her obesity related diagnoses. Megan [name] is on the Category 2 Plan with 6 oz protein at lunch and states she is following her eating plan approximately 96% of the time. Megan [name] states she is using the stepper 45 minutes 2 times per week.  Today's visit was #: 6 Starting weight: 224 lbs Starting date: 08/09/2021 Today's weight: 208 lbs Today's date: 10/24/2021 Total lbs lost to date: 16 Total lbs lost since last in-office visit: 3  Interim History: Megan [name] traveled to [place] over the weekend. She stayed on plan very well and really planned ahead. 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   Subjective:   1. Pre-diabetes Sumayah denies hunger or sweet/carb cravings.  2. Vitamin D deficiency Her Vit D level was 26.1 about 2 months  ago. Pt is taking OTC Vit D 6,000 IU daily.  3. Essential hypertension Pt is asymptomatic with no concerns. Medication: Hyzaar  Assessment/Plan:  No orders of the defined types were placed in this encounter.   There are no discontinued medications.   No orders of the defined types were placed in this encounter.    1. Pre-diabetes Tahirih will continue to work on weight loss, exercise, and decreasing simple carbohydrates to help decrease the risk of diabetes. Continue Metformin 250 mg.  2. Vitamin D deficiency Low Vitamin D level contributes to fatigue and are associated with obesity, breast, and colon cancer. She agrees to continue to take OTC Vitamin D 6,000 IU every week and will follow-up for routine testing of Vitamin D, at least 2-3 times per year to avoid over-replacement. Repeat Vit D level after 3-4 months of starting supplementation. Continue bone density exams every 2 years with GYN. Last scan within normal limits.  3. Essential hypertension At goal. Kambryn is working on healthy weight loss and exercise  to improve blood pressure control. We will watch for signs of hypotension as she continues her lifestyle modifications. BP much improved from prior OV's. Continue meds, prudent nutritional plan, decrease salt intake, and continue weight loss.  4. Obesity, Current BMI 42.1 Asheley is currently in the action stage of change. As such, her goal is to continue with weight loss efforts. She has agreed to the Category 2 Plan with 6 oz protein ar lunch .   Increase activity levels to 3 days a week. Pt is going to the beach for the weekend with her girlfriends.  Exercise goals: For substantial health benefits, adults should do at least 150 minutes (2 hours and 30 minutes) a week of moderate-intensity, or 75 minutes (1 hour and 15 minutes) a week of vigorous-intensity aerobic physical activity, or an equivalent combination of moderate- and vigorous-intensity aerobic activity. Aerobic activity should be performed in episodes of at least 10 minutes, and preferably, it should be spread throughout the week.  Behavioral modification strategies: increasing lean protein intake, decreasing simple carbohydrates, and planning for success.  Megan [name] has agreed to follow-up with our clinic in 2-3 weeks. She was informed of the importance of frequent follow-up visits to maximize her success with intensive lifestyle modifications for her multiple health conditions.   Objective:   Blood pressure 129/72, pulse (!) 56, temperature 98.5 F (36.9 C), height '4\' 11"'$  (1.499 m), weight 208 lb (94.3 kg), last menstrual period 09/20/2011, SpO2 96 %. Body mass index is 42.01 kg/m.  General: Cooperative,  alert, well developed, in no acute distress. HEENT: Conjunctivae and lids unremarkable. Cardiovascular: Regular rhythm.  Lungs: Normal work of breathing. Neurologic: No focal deficits.   Lab Results  Component Value Date   CREATININE 0.62 08/09/2021   BUN 13 08/09/2021   NA 143 08/09/2021   K 4.7 08/09/2021   CL 104  08/09/2021   CO2 25 08/09/2021   Lab Results  Component Value Date   ALT 19 08/09/2021   AST 15 08/09/2021   ALKPHOS 87 08/09/2021   BILITOT 0.2 08/09/2021   Lab Results  Component Value Date   HGBA1C 6.4 (H) 08/09/2021   Lab Results  Component Value Date   INSULIN 29.6 (H) 08/09/2021   Lab Results  Component Value Date   TSH 1.730 08/09/2021   Lab Results  Component Value Date   CHOL 210 (H) 08/09/2021   HDL 48 08/09/2021   LDLCALC 140 (H) 08/09/2021   TRIG 121 08/09/2021   Lab Results  Component Value Date   VD25OH 26.1 (L) 08/09/2021   VD25OH 26 (L) 10/08/2015   Lab Results  Component Value Date   WBC 5.7 08/09/2021   HGB 14.7 08/09/2021   HCT 45.8 08/09/2021   MCV 85 08/09/2021   PLT 255 08/09/2021   No results found for: "IRON", "TIBC", "FERRITIN"  Obesity Behavioral Intervention:   Approximately 15 minutes were spent on the discussion below.  ASK: We discussed the diagnosis of obesity with [name] today and Yarlin agreed to give [place] permission to discuss obesity behavioral modification therapy today.  ASSESS: Daisey has the diagnosis of obesity and her BMI today is 42.1. Adrain is in the action stage of change.   ADVISE: Eliyanna was educated on the multiple health risks of obesity as well as the benefit of weight loss to improve her health. She was advised of the need for long term treatment and the importance of lifestyle modifications to improve her current health and to decrease her risk of future health problems.  AGREE: Multiple dietary modification options and treatment options were discussed and Avani agreed to follow the recommendations documented in the above note.  ARRANGE: Clarke was educated on the importance of frequent visits to treat obesity as outlined per CMS and USPSTF guidelines and agreed to schedule her next follow up appointment today.  Attestation Statements:   Reviewed by clinician on day of visit: allergies, medications,  problem list, medical history, surgical history, family history, social history, and previous encounter notes.  I, Kathlene November, BS, CMA, am acting as transcriptionist for [employer], DO.  I have reviewed the above documentation for accuracy and completeness, and I agree with the above. Marjory Sneddon, D.O.  The [place] was signed into law in 2016 which includes the topic of electronic health records.  This provides immediate access to information in MyChart.  This includes consultation notes, operative notes, office notes, lab results and pathology reports.  If you have any questions about what you read please let us know at your next visit so we can discuss your concerns and take corrective action if need be.  We are right here with you.

## 2021-11-01 NOTE — Progress Notes (Unsigned)
  Office: [phone]  /  Fax: [phone]    Date: 11/15/2021   Appointment Start Time: *** Duration: *** minutes Provider: [name], Psy.D. Type of Session: Individual Therapy  Location of Patient: {gbptloc:23249} (private location) Location of Provider: Provider's Home (private office) Type of Contact: Telepsychological Visit via MyChart Video Visit  Session Content: Megan [name] is a 71 y.o. female presenting for a follow-up appointment to address the previously established treatment goal of increasing coping skills.Today's appointment was a telepsychological visit. Megan [name] provided verbal consent for today's telepsychological appointment and she is aware she is responsible for securing confidentiality on her end of the session. Prior to proceeding with today's appointment, Megan [name]'s physical location at the time of this appointment was obtained as well a phone number she could be reached at in the event of technical difficulties. Megan [name] and this provider participated in today's telepsychological service.   This provider conducted a brief check-in. *** Megan [name] was receptive to today's appointment as evidenced by openness to sharing, responsiveness to feedback, and {gbreceptiveness:23401}.  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 Megan [name] has demonstrated progress in her goal as evidenced by {gbtxprogress:22839}. Megan [name] also {gbtxprogress2:22951}.  Plan: The next appointment is scheduled for *** at ***, which will be via MyChart Video Visit. The next session will focus on {Plan for Next Appointment:23400}.

## 2021-11-14 ENCOUNTER — Ambulatory Visit (INDEPENDENT_AMBULATORY_CARE_PROVIDER_SITE_OTHER): Payer: Medicare Other | Admitting: Family Medicine

## 2021-11-14 ENCOUNTER — Encounter (INDEPENDENT_AMBULATORY_CARE_PROVIDER_SITE_OTHER): Payer: Self-pay | Admitting: Family Medicine

## 2021-11-14 VITALS — BP 140/85 | HR 55 | Temp 97.9°F | Ht 59.0 in | Wt 206.0 lb

## 2021-11-14 DIAGNOSIS — R7303 Prediabetes: Secondary | ICD-10-CM | POA: Diagnosis not present

## 2021-11-14 DIAGNOSIS — E669 Obesity, unspecified: Secondary | ICD-10-CM

## 2021-11-14 DIAGNOSIS — E559 Vitamin D deficiency, unspecified: Secondary | ICD-10-CM

## 2021-11-14 DIAGNOSIS — Z6841 Body Mass Index (BMI) 40.0 and over, adult: Secondary | ICD-10-CM

## 2021-11-14 MED ORDER — METFORMIN HCL 500 MG PO TABS: ORAL_TABLET | ORAL | 0 refills | Status: DC

## 2021-11-15 ENCOUNTER — Telehealth (INDEPENDENT_AMBULATORY_CARE_PROVIDER_SITE_OTHER): Payer: Medicare Other | Admitting: Psychology

## 2021-11-15 DIAGNOSIS — F5089 Other specified eating disorder: Secondary | ICD-10-CM | POA: Diagnosis not present

## 2021-11-18 ENCOUNTER — Other Ambulatory Visit (INDEPENDENT_AMBULATORY_CARE_PROVIDER_SITE_OTHER): Payer: Self-pay | Admitting: Family Medicine

## 2021-11-18 DIAGNOSIS — R7303 Prediabetes: Secondary | ICD-10-CM

## 2021-11-18 NOTE — Progress Notes (Signed)
Chief Complaint:   OBESITY Avanni is here to discuss her progress with her obesity treatment plan along with follow-up of her obesity related diagnoses. Jetaime is on the Category 2 Plan with 6 oz protein with lunch and states she is following her eating plan approximately 85% of the time. Chanda states she is doing cardio 45-60 minutes 3 times per week.  Today's visit was #: 7 Starting weight: 224 lbs Starting date: 08/09/2021 Today's weight: 206 lbs Today's date: 11/14/2021 Total lbs lost to date: 18 Total lbs lost since last in-office visit: 2  Interim History: Mileydi has been working with Dr. Mallie Mussel and has her next meeting tomorrow. She denies emotional eating concerns and has been doing well. Pt has focused on grand kids and not on herself lately. She still skips meals at times and finds focusing on herself to be difficult.  Subjective:   1. Prediabetes Rinnah has a diagnosis of prediabetes based on her elevated HgA1c and was informed this puts her at greater risk of developing diabetes. She continues to work on diet and exercise to decrease her risk of diabetes. She denies nausea or hypoglycemia. Her A1c 3 months ago was 6.4.  2. Vitamin D deficiency Pt's Vitamin D was last checked 3 months ago, and she was sub-optimal at 26.1. She has been taking OTC Vit D 6,000 IU daily since then.  Assessment/Plan:  No orders of the defined types were placed in this encounter.   Medications Discontinued During This Encounter  Medication Reason   metFORMIN (GLUCOPHAGE) 500 MG tablet Reorder     Meds ordered this encounter  Medications   metFORMIN (GLUCOPHAGE) 500 MG tablet    Sig: 1 tab po with lunch daily    Dispense:  30 tablet    Refill:  0     1. Prediabetes Giavanna will continue to work on weight loss, exercise, and decreasing simple carbohydrates to help decrease the risk of diabetes. Repeat A1c at next OV, if not done with PCP.  Refill- metFORMIN (GLUCOPHAGE) 500 MG  tablet; 1 tab po with lunch daily  Dispense: 30 tablet; Refill: 0  2. Vitamin D deficiency Low Vitamin D level contributes to fatigue and are associated with obesity, breast, and colon cancer. She agrees to continue to take OTC Vitamin D at 6,000 IU daily and will follow-up for routine testing of Vitamin D, at least 2-3 times per year to avoid over-replacement. Repeat Vit D level, if not done with PCP.  3. Obesity, Current BMI 41.6 Jim is currently in the action stage of change. As such, her goal is to continue with weight loss efforts. She has agreed to the Category 2 Plan with 6 oz protein at lunch.   Pt had labs with her PCP 10/04/21 and will bring a copy of them to her next OV for review. She will need an A1c and Vitamin D, if they were not done with PCP.  Exercise goals:  Increase workouts as tolerated.  Behavioral modification strategies: no skipping meals, meal planning and cooking strategies, and planning for success.  Nychelle has agreed to follow-up with our clinic in 2 weeks. She was informed of the importance of frequent follow-up visits to maximize her success with intensive lifestyle modifications for her multiple health conditions.   Objective:   Blood pressure 140/85, pulse (!) 55, temperature 97.9 F (36.6 C), height '4\' 11"'$  (1.499 m), weight 206 lb (93.4 kg), last menstrual period 09/20/2011, SpO2 94 %. Body mass index is  41.61 kg/m.  General: Cooperative, alert, well developed, in no acute distress. HEENT: Conjunctivae and lids unremarkable. Cardiovascular: Regular rhythm.  Lungs: Normal work of breathing. Neurologic: No focal deficits.   Lab Results  Component Value Date   CREATININE 0.62 08/09/2021   BUN 13 08/09/2021   NA 143 08/09/2021   K 4.7 08/09/2021   CL 104 08/09/2021   CO2 25 08/09/2021   Lab Results  Component Value Date   ALT 19 08/09/2021   AST 15 08/09/2021   ALKPHOS 87 08/09/2021   BILITOT 0.2 08/09/2021   Lab Results  Component Value  Date   HGBA1C 6.4 (H) 08/09/2021   Lab Results  Component Value Date   INSULIN 29.6 (H) 08/09/2021   Lab Results  Component Value Date   TSH 1.730 08/09/2021   Lab Results  Component Value Date   CHOL 210 (H) 08/09/2021   HDL 48 08/09/2021   LDLCALC 140 (H) 08/09/2021   TRIG 121 08/09/2021   Lab Results  Component Value Date   VD25OH 26.1 (L) 08/09/2021   VD25OH 26 (L) 10/08/2015   Lab Results  Component Value Date   WBC 5.7 08/09/2021   HGB 14.7 08/09/2021   HCT 45.8 08/09/2021   MCV 85 08/09/2021   PLT 255 08/09/2021   No results found for: "IRON", "TIBC", "FERRITIN"  Obesity Behavioral Intervention:   Approximately 15 minutes were spent on the discussion below.  ASK: We discussed the diagnosis of obesity with [name] today and Teka agreed to give [place] permission to discuss obesity behavioral modification therapy today.  ASSESS: Yuko has the diagnosis of obesity and her BMI today is 41.6. Melanye is in the action stage of change.   ADVISE: Akela was educated on the multiple health risks of obesity as well as the benefit of weight loss to improve her health. She was advised of the need for long term treatment and the importance of lifestyle modifications to improve her current health and to decrease her risk of future health problems.  AGREE: Multiple dietary modification options and treatment options were discussed and Beckham agreed to follow the recommendations documented in the above note.  ARRANGE: Brandilynn was educated on the importance of frequent visits to treat obesity as outlined per CMS and USPSTF guidelines and agreed to schedule her next follow up appointment today.  Attestation Statements:   Reviewed by clinician on day of visit: allergies, medications, problem list, medical history, surgical history, family history, social history, and previous encounter notes.  I, Kathlene November, BS, CMA, am acting as transcriptionist for [employer],  DO.  I have reviewed the above documentation for accuracy and completeness, and I agree with the above. Marjory Sneddon, D.O.  The [place] was signed into law in 2016 which includes the topic of electronic health records.  This provides immediate access to information in MyChart.  This includes consultation notes, operative notes, office notes, lab results and pathology reports.  If you have any questions about what you read please let us know at your next visit so we can discuss your concerns and take corrective action if need be.  We are right here with you.

## 2021-12-06 ENCOUNTER — Ambulatory Visit (INDEPENDENT_AMBULATORY_CARE_PROVIDER_SITE_OTHER): Payer: Medicare Other | Admitting: Family Medicine

## 2021-12-13 ENCOUNTER — Other Ambulatory Visit (INDEPENDENT_AMBULATORY_CARE_PROVIDER_SITE_OTHER): Payer: Self-pay | Admitting: Family Medicine

## 2021-12-13 DIAGNOSIS — R7303 Prediabetes: Secondary | ICD-10-CM

## 2021-12-14 ENCOUNTER — Encounter (INDEPENDENT_AMBULATORY_CARE_PROVIDER_SITE_OTHER): Payer: Self-pay | Admitting: Family Medicine

## 2021-12-14 ENCOUNTER — Ambulatory Visit (INDEPENDENT_AMBULATORY_CARE_PROVIDER_SITE_OTHER): Payer: Medicare Other | Admitting: Family Medicine

## 2021-12-14 VITALS — BP 138/84 | HR 59 | Temp 98.0°F | Ht 59.0 in | Wt 202.0 lb

## 2021-12-14 DIAGNOSIS — R7303 Prediabetes: Secondary | ICD-10-CM | POA: Diagnosis not present

## 2021-12-14 DIAGNOSIS — E559 Vitamin D deficiency, unspecified: Secondary | ICD-10-CM | POA: Diagnosis not present

## 2021-12-14 DIAGNOSIS — Z7984 Long term (current) use of oral hypoglycemic drugs: Secondary | ICD-10-CM | POA: Diagnosis not present

## 2021-12-14 DIAGNOSIS — E669 Obesity, unspecified: Secondary | ICD-10-CM

## 2021-12-14 DIAGNOSIS — Z6841 Body Mass Index (BMI) 40.0 and over, adult: Secondary | ICD-10-CM

## 2021-12-14 MED ORDER — METFORMIN HCL 500 MG PO TABS: ORAL_TABLET | ORAL | 0 refills | Status: DC

## 2021-12-15 DIAGNOSIS — M85852 Other specified disorders of bone density and structure, left thigh: Secondary | ICD-10-CM | POA: Diagnosis not present

## 2021-12-15 DIAGNOSIS — M85851 Other specified disorders of bone density and structure, right thigh: Secondary | ICD-10-CM | POA: Diagnosis not present

## 2021-12-15 LAB — HEMOGLOBIN A1C
Est. average glucose Bld gHb Est-mCnc: 126 mg/dL
Hgb A1c MFr Bld: 6 % — ABNORMAL HIGH (ref 4.8–5.6)

## 2021-12-15 LAB — VITAMIN D 25 HYDROXY (VIT D DEFICIENCY, FRACTURES): Vit D, 25-Hydroxy: 58.8 ng/mL (ref 30.0–100.0)

## 2021-12-18 NOTE — Progress Notes (Signed)
Chief Complaint:   OBESITY Megan [name] is here to discuss her progress with her obesity treatment plan along with follow-up of her obesity related diagnoses. Megan [name] is on the Category 2 Plan with 6 oz protein at lunch and states she is following her eating plan approximately 85+% of the time. Megan [name] states she is using sit down elliptical 45 minutes 1 times per week.  Today's visit was #: 8 Starting weight: 224 lbs Starting date: 08/09/2021 Today's weight: 202 lbs Today's date: 12/14/2021 Total lbs lost to date: 22 Total lbs lost since last in-office visit: 4  Interim History: Megan [name] had an anniversary dinner and a lot of other temptations during the past month, and pt did great with still losing 4 lbs. She is bored with lunch. Pt is drinking 64 oz of water per day, with no caloric drinks.  Subjective:   1. Prediabetes Megan [name] denies side effects of Metformin and is tolerating it well. She still has some carb cravings and eats more of them than what's on the plan.  2. Vitamin D deficiency Pt increased Vit D to 10000 IU approximately 3 weeks ago, but for the majority of the time, she has only been on 5000 IU.  Assessment/Plan:   Orders Placed This Encounter  Procedures   VITAMIN D 25 Hydroxy (Vit-D Deficiency, Fractures)   Hemoglobin A1c    Medications Discontinued During This Encounter  Medication Reason   metFORMIN (GLUCOPHAGE) 500 MG tablet Reorder     Meds ordered this encounter  Medications   metFORMIN (GLUCOPHAGE) 500 MG tablet    Sig: 1 tab po with lunch and 1 with dinner    Dispense:  60 tablet    Refill:  0     1. Prediabetes Megan [name] will continue to work on weight loss, exercise, and decreasing simple carbohydrates to help decrease the risk of diabetes. Increase Metformin to BID. Obtain fasting labs today.  Increase & Refill- metFORMIN (GLUCOPHAGE) 500 MG tablet; 1 tab po with lunch and 1 with dinner  Dispense: 60 tablet; Refill: 0  - Hemoglobin A1c  2.  Vitamin D deficiency Low Vitamin D level contributes to fatigue and are associated with obesity, breast, and colon cancer. She agrees to continue OTC Vitamin D 10,000 IU daily and will follow-up for routine testing of Vitamin D, at least 2-3 times per year to avoid over-replacement. Obtain fasting labs today.  - VITAMIN D 25 Hydroxy (Vit-D Deficiency, Fractures)  3. Obesity, Current BMI 40.8 Megan [name] is currently in the action stage of change. As such, her goal is to continue with weight loss efforts. She has [name] with breakfast options and 6 oz of protein at lunch.   Double Metformin dose today.  Exercise goals:  As is  Behavioral modification strategies: celebration eating strategies and avoiding temptations.  Megan [name] has [name] with our clinic in 2-3 weeks. She was informed of the importance of frequent follow-up visits to maximize her success with intensive lifestyle modifications for her multiple health conditions.   Megan [name]. Megan [name] to keep her next visit at the [name] upon time to discuss these results.  Objective:   Blood pressure 138/84, pulse (!) 59, temperature 98 F (36.7 C), height '4\' 11"'$  (1.499 m), weight 202 lb (91.6 kg), last menstrual period 09/20/2011, SpO2 97 %. Body mass index is  40.8 kg/m.  General: Cooperative, alert, well developed, in no acute distress. HEENT: Conjunctivae and lids unremarkable. Cardiovascular: Regular rhythm.  Lungs: Normal work of breathing. Neurologic: No focal deficits.   Lab Results  Component Value Date   CREATININE 0.62 08/09/2021   BUN 13 08/09/2021   NA 143 08/09/2021   K 4.7 08/09/2021   CL 104 08/09/2021   CO2 25 08/09/2021   Lab Results  Component Value Date   ALT 19 08/09/2021   AST 15 08/09/2021   ALKPHOS 87 08/09/2021   BILITOT 0.2 08/09/2021    Lab Results  Component Value Date   HGBA1C 6.0 (H) 12/14/2021   HGBA1C 6.4 (H) 08/09/2021   Lab Results  Component Value Date   INSULIN 29.6 (H) 08/09/2021   Lab Results  Component Value Date   TSH 1.730 08/09/2021   Lab Results  Component Value Date   CHOL 210 (H) 08/09/2021   HDL 48 08/09/2021   LDLCALC 140 (H) 08/09/2021   TRIG 121 08/09/2021   Lab Results  Component Value Date   VD25OH 58.8 12/14/2021   VD25OH 26.1 (L) 08/09/2021   VD25OH 26 (L) 10/08/2015   Lab Results  Component Value Date   WBC 5.7 08/09/2021   HGB 14.7 08/09/2021   HCT 45.8 08/09/2021   MCV 85 08/09/2021   PLT 255 08/09/2021   No results found for: "IRON", "TIBC", "FERRITIN"  Obesity Behavioral Intervention:   Approximately 15 minutes were spent on the discussion below.  ASK: We discussed the diagnosis of obesity with Megan [name] today and Megan [name] to give [place] permission to discuss obesity behavioral modification therapy today.  ASSESS: Megan [name] has the diagnosis of obesity and her BMI today is 40.8. Megan [name] is in the action stage of change.   ADVISE: Megan [name] was educated on the multiple health risks of obesity as well as the benefit of weight loss to improve her health. She was advised of the need for long term treatment and the importance of lifestyle modifications to improve her current health and to decrease her risk of future health problems.  AGREE: Multiple dietary modification options and treatment options were discussed and Megan [name] to follow the recommendations documented in the above note.  ARRANGE: Megan [name] was educated on the importance of frequent visits to treat obesity as outlined per CMS and USPSTF guidelines and [name] to schedule her next follow up appointment today.  Attestation Statements:   Reviewed by clinician on day of visit: allergies, medications, problem list, medical history, surgical history, family history, social history, and previous encounter  notes.  I, Kathlene November, BS, CMA, am acting as transcriptionist for [employer], DO.   I have reviewed the above documentation for accuracy and completeness, and I agree with the above. Megan [name], D.O.  The [place] was signed into law in 2016 which includes the topic of electronic health records.  This provides immediate access to information in MyChart.  This includes consultation notes, operative notes, office notes, lab results and pathology reports.  If you have any questions about what you read please let us know at your next visit so we can discuss your concerns and take corrective action if need be.  We are right here with you.

## 2021-12-19 ENCOUNTER — Encounter (HOSPITAL_BASED_OUTPATIENT_CLINIC_OR_DEPARTMENT_OTHER): Payer: Self-pay | Admitting: *Deleted

## 2021-12-19 ENCOUNTER — Ambulatory Visit: Payer: Medicare Other | Admitting: Dermatology

## 2021-12-22 IMAGING — MR MR KNEE*L* W/O CM
4 of 7 series · 22 of 40 positions shown · non-contrast
Comparison: None.

CLINICAL DATA: Left knee pain

EXAM:
MRI OF THE LEFT KNEE WITHOUT CONTRAST
TECHNIQUE: Multiplanar, multisequence MR imaging of the knee was performed. No
intravenous contrast was administered.

[Series 3: T2 fat-sat · axial · 4.0mm · 0.50mm/px · z∈[-108,+7]mm · 6 of 24 slices shown]
[im 1/24]
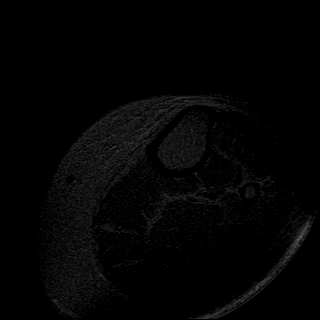
[im 5/24]
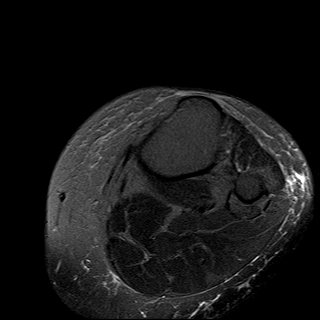
[im 10/24]
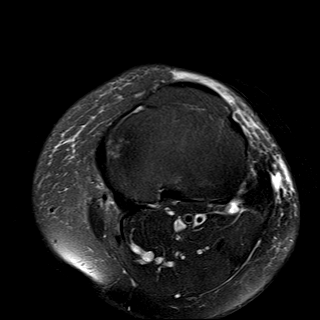
[im 14/24]
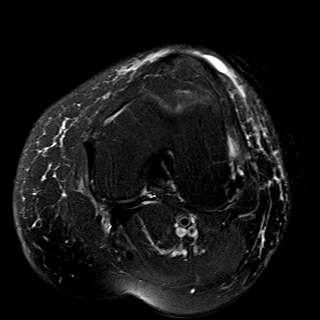
[im 19/24]
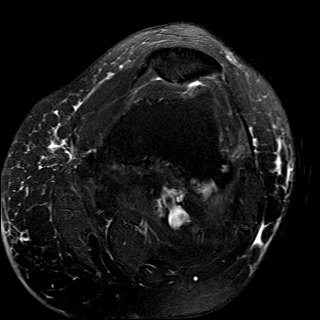
[im 24/24]
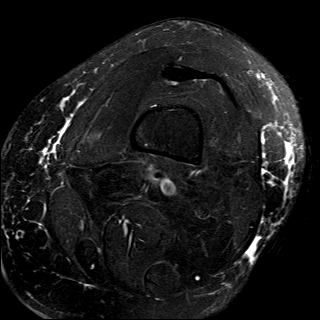

[Series 7: PD fat-sat · sagittal · 3.0mm · 0.29mm/px · 6 of 27 slices shown (1 of 3)]
[im 1/27]
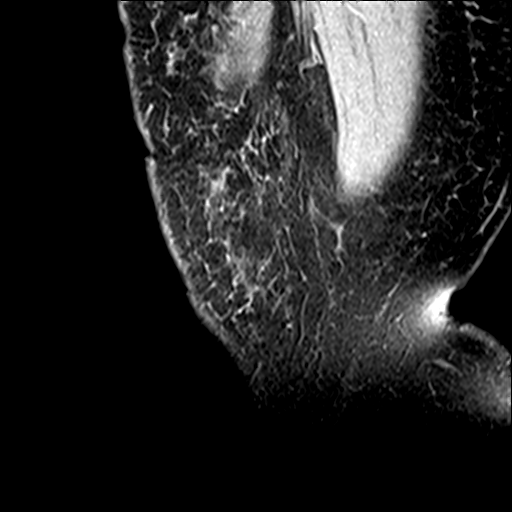
[im 6/27]
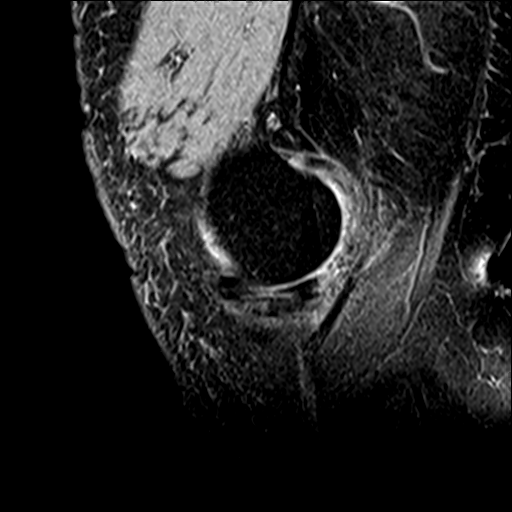
[im 11/27]
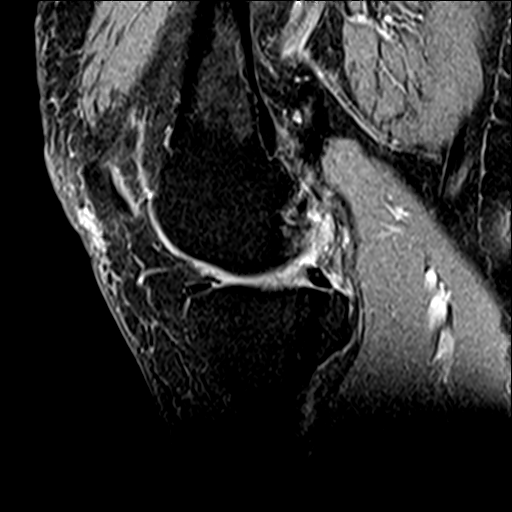
[im 16/27]
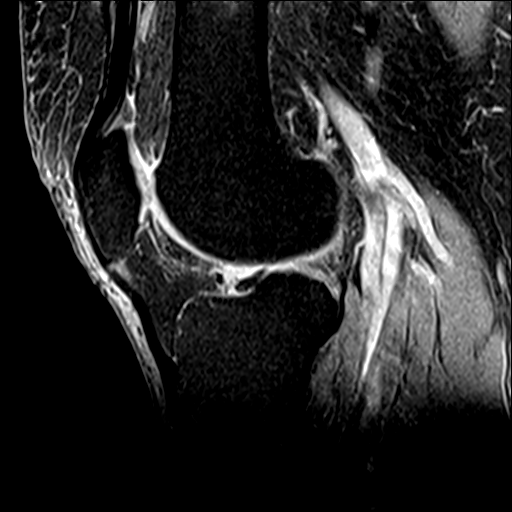
[im 21/27]
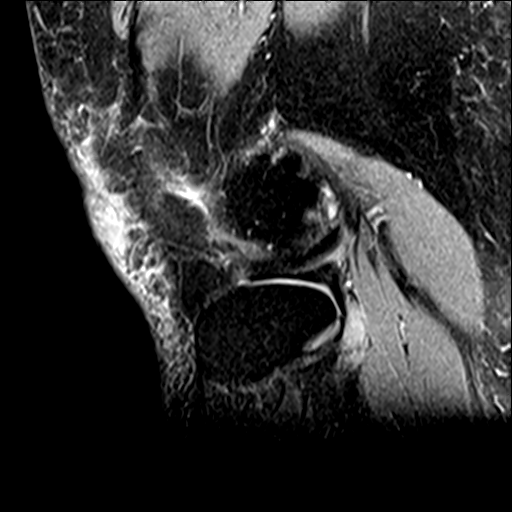
[im 27/27]
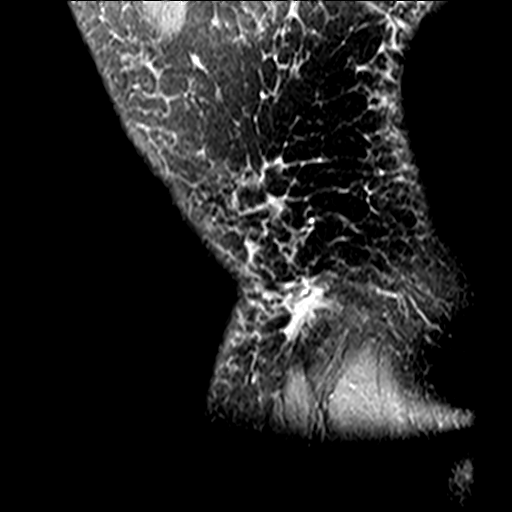

[Series 8: PD fat-sat · coronal · 3.0mm · 0.29mm/px · 7 of 28 slices shown (2 of 3)]
[im 1/28]
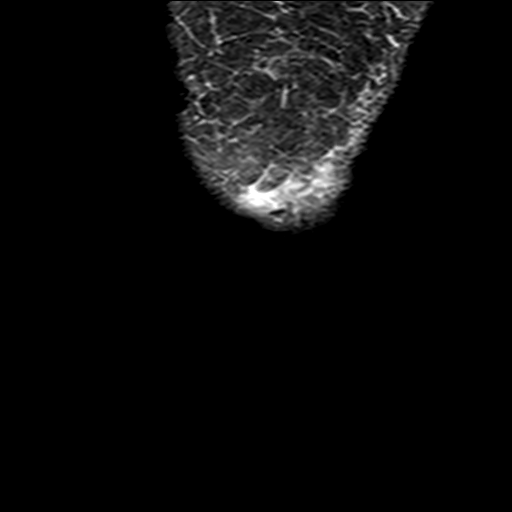
[im 5/28]
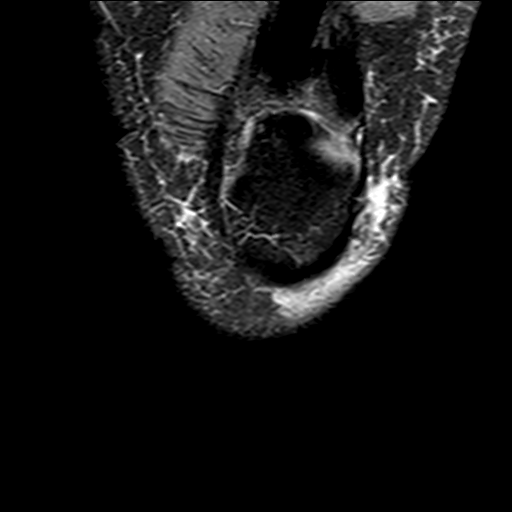
[im 10/28]
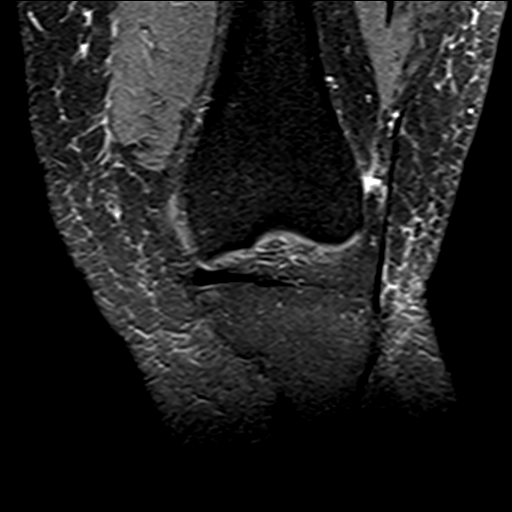
[im 14/28]
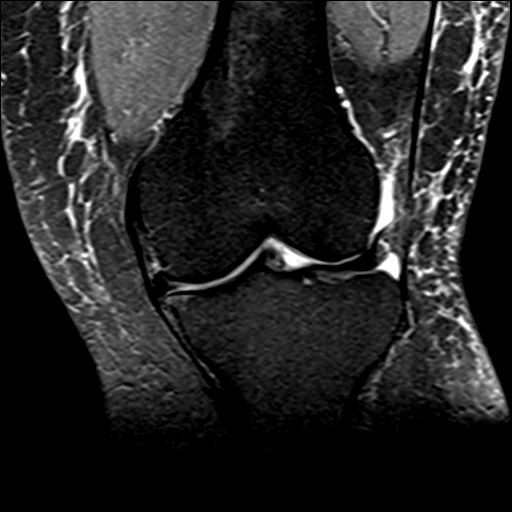
[im 19/28]
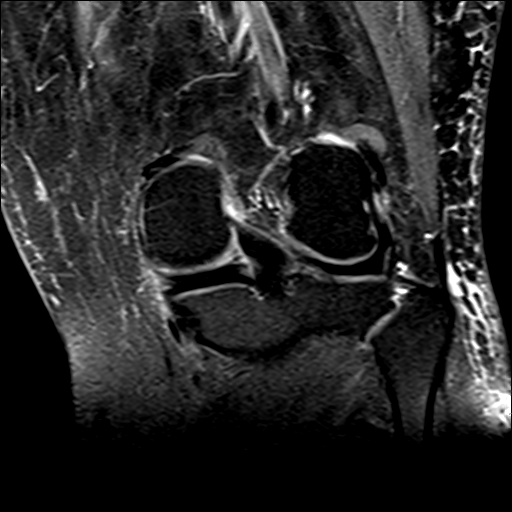
[im 23/28]
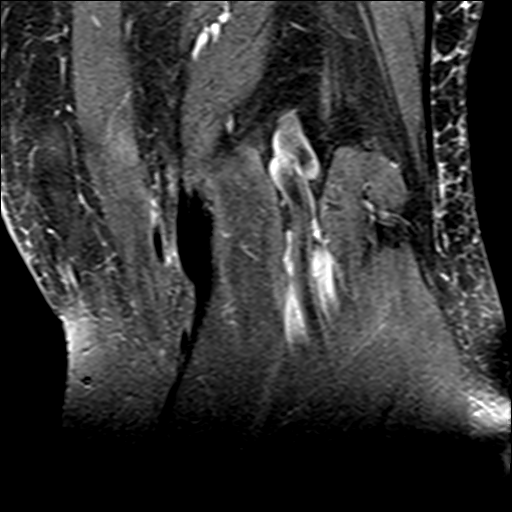
[im 28/28]
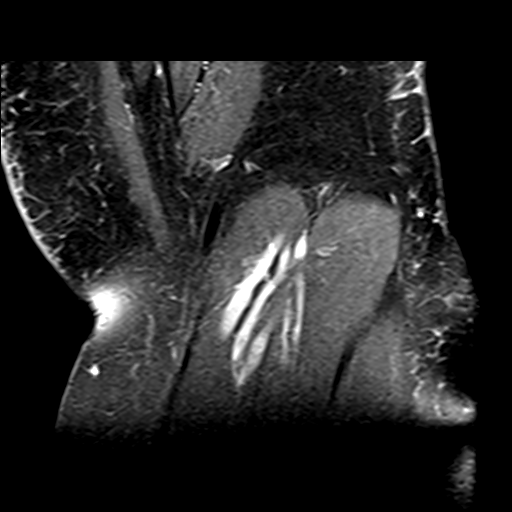

[Series 9: PD fat-sat · oblique · 2.0mm · 0.29mm/px · 3 of 11 slices shown (3 of 3)]
[im 1/11]
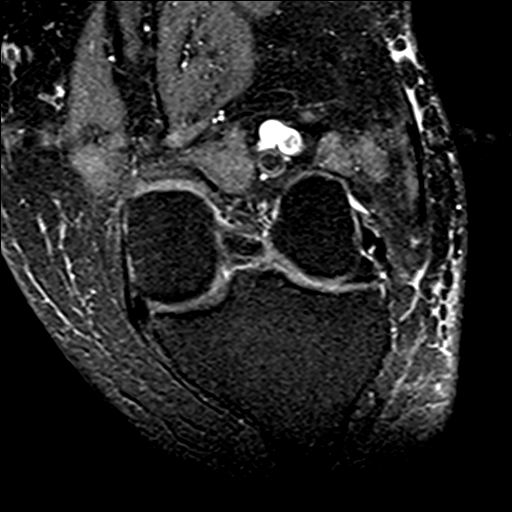
[im 6/11]
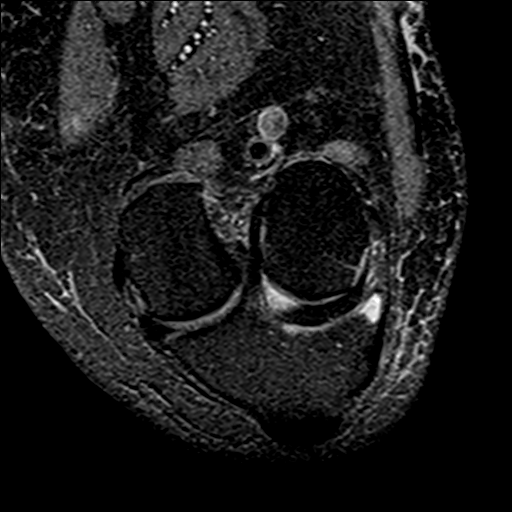
[im 11/11]
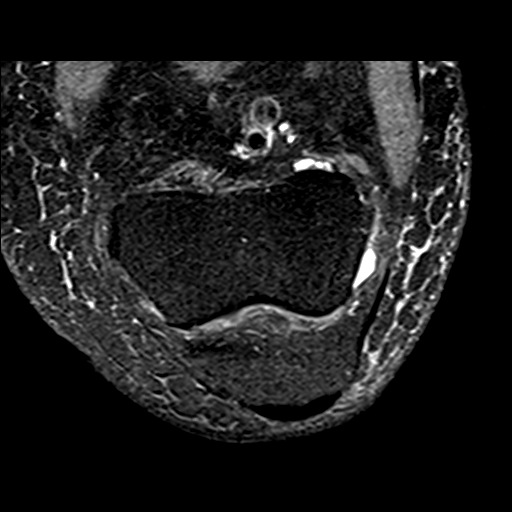

[22 of 40 positions shown; findings below may reference images not displayed]

FINDINGS: MENISCI

Medial: Focal nondisplaced degenerative tear of the posterior horn
of the medial meniscus (sagittal PD images 18-19). Free edge
blunting of the medial meniscal body.

Lateral: Undersurface fraying at the posterior root of the lateral
meniscus.

LIGAMENTS

Cruciates: ACL and PCL are intact.

Collaterals: Medial collateral ligament is intact. The lateral
collateral ligament appears somewhat lax but is intact. The biceps
femoris is intact. The popliteus tendon is intact.

CARTILAGE

Patellofemoral: Intermediate grade partial-thickness cartilage loss
along the medial patellar facet.

Medial:  Moderate chondrosis.

Lateral:  Moderate chondrosis.

JOINT: No significant joint effusion.

POPLITEAL FOSSA: No Baker's cyst.

EXTENSOR MECHANISM: Intact quadriceps tendon. Intact patellar
tendon.

BONES: No aggressive osseous lesion. No fracture or dislocation.
Tricompartment osteophyte formation.

Other: There is distension of prepatellar bursa along the lateral
aspect. This measures 1.9 x 0.3 x 2.6 cm (axial T2 image 9, sagittal
T2 image 9).
IMPRESSION: Focal nondisplaced degenerative tear of the posterior horn of the
medial meniscus. Free edge blunting of the medial meniscal body.

Undersurface fraying at the posterior root of the lateral meniscus.

Tricompartment osteoarthritis, with overall moderate chondrosis.

Prepatellar bursitis with fluid collection measuring 1.9 x 0.3 x
cm.

## 2021-12-26 ENCOUNTER — Ambulatory Visit (INDEPENDENT_AMBULATORY_CARE_PROVIDER_SITE_OTHER): Payer: Medicare Other | Admitting: Family Medicine

## 2021-12-27 ENCOUNTER — Telehealth (HOSPITAL_BASED_OUTPATIENT_CLINIC_OR_DEPARTMENT_OTHER): Payer: Self-pay | Admitting: *Deleted

## 2021-12-27 NOTE — Telephone Encounter (Signed)
Pt called requesting results from bone density test. Advised that per Dr. Sabra Heck, test showed osteopenia which does not require treatment. Advised that repeat testing is recommended in 3-4 years. Pt verbalized understanding.

## 2021-12-28 ENCOUNTER — Encounter (INDEPENDENT_AMBULATORY_CARE_PROVIDER_SITE_OTHER): Payer: Self-pay

## 2021-12-31 ENCOUNTER — Other Ambulatory Visit: Payer: Self-pay | Admitting: Dermatology

## 2021-12-31 DIAGNOSIS — L219 Seborrheic dermatitis, unspecified: Secondary | ICD-10-CM

## 2022-01-06 ENCOUNTER — Other Ambulatory Visit (INDEPENDENT_AMBULATORY_CARE_PROVIDER_SITE_OTHER): Payer: Self-pay | Admitting: Family Medicine

## 2022-01-06 DIAGNOSIS — R7303 Prediabetes: Secondary | ICD-10-CM

## 2022-01-12 ENCOUNTER — Encounter (INDEPENDENT_AMBULATORY_CARE_PROVIDER_SITE_OTHER): Payer: Self-pay | Admitting: Family Medicine

## 2022-01-12 ENCOUNTER — Ambulatory Visit (INDEPENDENT_AMBULATORY_CARE_PROVIDER_SITE_OTHER): Payer: Medicare Other | Admitting: Family Medicine

## 2022-01-12 VITALS — BP 128/86 | HR 55 | Temp 97.6°F | Ht 59.0 in | Wt 200.0 lb

## 2022-01-12 DIAGNOSIS — Z6841 Body Mass Index (BMI) 40.0 and over, adult: Secondary | ICD-10-CM

## 2022-01-12 DIAGNOSIS — R7303 Prediabetes: Secondary | ICD-10-CM | POA: Diagnosis not present

## 2022-01-12 DIAGNOSIS — E669 Obesity, unspecified: Secondary | ICD-10-CM | POA: Diagnosis not present

## 2022-01-12 DIAGNOSIS — E559 Vitamin D deficiency, unspecified: Secondary | ICD-10-CM | POA: Diagnosis not present

## 2022-01-19 NOTE — Progress Notes (Signed)
Chief Complaint:   OBESITY Megan [name] is here to discuss her progress with her obesity treatment plan along with follow-up of her obesity related diagnoses. Megan [name] is on the Category 2 Plan with breakfast and lunch options and states she is following her eating plan approximately 95% of the time. Megan [name] states she is using sitting elliptical and swimming 45 minutes 3 times per week.  Today's visit was #: 9 Starting weight: 224 lbs Starting date: 08/09/2021 Today's weight: 200 lbs Today's date: 01/12/2022 Total lbs lost to date: 24 Total lbs lost since last in-office visit: 2  Interim History: Megan [name] is here for a follow up office visit.  We reviewed her meal plan and all questions were answered.  Patient's food recall appears to be accurate and consistent with what is on plan when she is following it.   When eating on plan, her hunger and cravings are well controlled.   Megan [name] went to the beach for a week and still lost 2 lbs.  Subjective:   1. Vitamin D deficiency Discussed labs with patient today. She is currently taking OTC vitamin D 10000 IU each day. She denies nausea, vomiting or muscle weakness.  2. Prediabetes Pt took 1/2 tab of Metformin daily and didn't realize it was the cause of her loose stools, which she had daily. She stopped taking it on Sunday and has had no loose stools since.  Assessment/Plan:  No orders of the defined types were placed in this encounter.   There are no discontinued medications.   No orders of the defined types were placed in this encounter.    1. Vitamin D deficiency Vit D at goal. Low Vitamin D level contributes to fatigue and are associated with obesity, breast, and colon cancer. She agrees, since it's the end of summer, she will continue to take OTC Vitamin D 10,000 IU daily and will follow-up for routine testing of Vitamin D, at least 2-3 times per year to avoid over-replacement.  2. Prediabetes A1c much improved from 6.4 to  6.0. Megan [name] will continue to work on weight loss, exercise, and decreasing simple carbohydrates to help decrease the risk of diabetes. Discontinue Metformin. Extensive counseling done.  3. Obesity, Current BMI 40.5 Megan [name] is currently in the action stage of change. As such, her goal is to continue with weight loss efforts. She has agreed to the Category 2 Plan with breakfast and lunch options.   Exercise goals: For substantial health benefits, adults should do at least 150 minutes (2 hours and 30 minutes) a week of moderate-intensity, or 75 minutes (1 hour and 15 minutes) a week of vigorous-intensity aerobic physical activity, or an equivalent combination of moderate- and vigorous-intensity aerobic activity. Aerobic activity should be performed in episodes of at least 10 minutes, and preferably, it should be spread throughout the week.  Behavioral modification strategies: increasing lean protein intake, decreasing simple carbohydrates, and planning for success.  Megan [name] has agreed to follow-up with our clinic in 3-4 weeks. She was informed of the importance of frequent follow-up visits to maximize her success with intensive lifestyle modifications for her multiple health conditions.   Objective:   Blood pressure 128/86, pulse (!) 55, temperature 97.6 F (36.4 C), height '4\' 11"'$  (1.499 m), weight 200 lb (90.7 kg), last menstrual period 09/20/2011, SpO2 95 %. Body mass index is 40.4 kg/m.  General: Cooperative, alert, well developed, in no acute distress. HEENT: Conjunctivae and lids unremarkable. Cardiovascular: Regular rhythm.  Lungs: Normal work of breathing.  Neurologic: No focal deficits.   Lab Results  Component Value Date   CREATININE 0.62 08/09/2021   BUN 13 08/09/2021   NA 143 08/09/2021   K 4.7 08/09/2021   CL 104 08/09/2021   CO2 25 08/09/2021   Lab Results  Component Value Date   ALT 19 08/09/2021   AST 15 08/09/2021   ALKPHOS 87 08/09/2021   BILITOT 0.2 08/09/2021    Lab Results  Component Value Date   HGBA1C 6.0 (H) 12/14/2021   HGBA1C 6.4 (H) 08/09/2021   Lab Results  Component Value Date   INSULIN 29.6 (H) 08/09/2021   Lab Results  Component Value Date   TSH 1.730 08/09/2021   Lab Results  Component Value Date   CHOL 210 (H) 08/09/2021   HDL 48 08/09/2021   LDLCALC 140 (H) 08/09/2021   TRIG 121 08/09/2021   Lab Results  Component Value Date   VD25OH 58.8 12/14/2021   VD25OH 26.1 (L) 08/09/2021   VD25OH 26 (L) 10/08/2015   Lab Results  Component Value Date   WBC 5.7 08/09/2021   HGB 14.7 08/09/2021   HCT 45.8 08/09/2021   MCV 85 08/09/2021   PLT 255 08/09/2021   No results found for: "IRON", "TIBC", "FERRITIN"  Obesity Behavioral Intervention:   Approximately 15 minutes were spent on the discussion below.  ASK: We discussed the diagnosis of obesity with Megan [name] today and Megan [name] agreed to give [place] permission to discuss obesity behavioral modification therapy today.  ASSESS: Megan [name] has the diagnosis of obesity and her BMI today is 40.5. Megan [name] is in the action stage of change.   ADVISE: Megan [name] was educated on the multiple health risks of obesity as well as the benefit of weight loss to improve her health. She was advised of the need for long term treatment and the importance of lifestyle modifications to improve her current health and to decrease her risk of future health problems.  AGREE: Multiple dietary modification options and treatment options were discussed and Megan [name] agreed to follow the recommendations documented in the above note.  ARRANGE: Megan [name] was educated on the importance of frequent visits to treat obesity as outlined per CMS and USPSTF guidelines and agreed to schedule her next follow up appointment today.  Attestation Statements:   Reviewed by clinician on day of visit: allergies, medications, problem list, medical history, surgical history, family history, social history, and previous encounter  notes.  I, Kathlene November, BS, CMA, am acting as transcriptionist for [employer], DO.   I have reviewed the above documentation for accuracy and completeness, and I agree with the above. Marjory Sneddon, D.O.  The [place] was signed into law in 2016 which includes the topic of electronic health records.  This provides immediate access to information in MyChart.  This includes consultation notes, operative notes, office notes, lab results and pathology reports.  If you have any questions about what you read please let us know at your next visit so we can discuss your concerns and take corrective action if need be.  We are right here with you.

## 2022-01-30 ENCOUNTER — Ambulatory Visit (INDEPENDENT_AMBULATORY_CARE_PROVIDER_SITE_OTHER): Payer: Medicare Other | Admitting: Family Medicine

## 2022-01-30 ENCOUNTER — Encounter (INDEPENDENT_AMBULATORY_CARE_PROVIDER_SITE_OTHER): Payer: Self-pay | Admitting: Family Medicine

## 2022-01-30 VITALS — BP 126/84 | HR 63 | Temp 97.7°F | Ht 59.0 in | Wt 202.0 lb

## 2022-01-30 DIAGNOSIS — I10 Essential (primary) hypertension: Secondary | ICD-10-CM | POA: Diagnosis not present

## 2022-01-30 DIAGNOSIS — E669 Obesity, unspecified: Secondary | ICD-10-CM | POA: Diagnosis not present

## 2022-01-30 DIAGNOSIS — R7303 Prediabetes: Secondary | ICD-10-CM

## 2022-01-30 DIAGNOSIS — Z6841 Body Mass Index (BMI) 40.0 and over, adult: Secondary | ICD-10-CM | POA: Diagnosis not present

## 2022-02-04 NOTE — Progress Notes (Signed)
Chief Complaint:   OBESITY Megan [name] is here to discuss her progress with her obesity treatment plan along with follow-up of her obesity related diagnoses. Megan [name] is on the Category 2 Plan with breakfast and lunch options and states she is following her eating plan approximately 80% of the time. Megan [name] states she is using sit down elliptical 45 minutes 2-3 times per week.  Today's visit was #: 10 Starting weight: 224 lbs Starting date: 08/09/2021 Today's weight: 202 lbs Today's date: 01/30/2022 Total lbs lost to date: 22 Total lbs lost since last in-office visit: +2  Interim History: Megan [name] says, "I know why I gained- I rebelled." She reports she ate what she wanted, which was fast foods. 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 Pt is drinking 32-64 oz water per day.   Subjective:   1. Prediabetes Megan [name] is off Metformin and no longer has loose stools. She denies change in cravings with being off it and no increase in hunger.  2. Essential hypertension Cardiovascular ROS: no chest pain or dyspnea on exertion.   Assessment/Plan:   Medications Discontinued During This Encounter  Medication Reason   metFORMIN (GLUCOPHAGE) 500 MG tablet Side effect (s)     1. Prediabetes Megan [name] will continue to work on weight loss, exercise, and decreasing simple carbohydrates to help decrease the risk of diabetes. Discontinue Metformin since it caused loose stools that weren't tolerable.  2. Essential hypertension At goal. Megan [name] is working on healthy weight loss and exercise to improve blood pressure control. We will watch for signs of hypotension as she continues her lifestyle modifications. Continue Hyzaar.  3. Obesity, Current BMI 40.8 Megan [name] is currently in the action stage of change. As such, her goal is to continue with weight loss efforts. She has agreed to the Category 2 Plan with breakfast and lunch options.   Recipe Packet II given to pt and discussed with pt ways to get all her food in.  Pt will change dinner with lunch. Try black bean spaghetti.  Exercise goals:  As is and increase as tolerated.  Behavioral modification strategies: increasing lean protein intake, decreasing simple carbohydrates, no skipping meals, and avoiding temptations.  Megan [name] has agreed to follow-up with our clinic in 2-3 weeks. She was informed of the importance of frequent follow-up visits to maximize her success with intensive lifestyle modifications for her multiple health conditions.    Objective:   Blood pressure 126/84, pulse 63, temperature 97.7 F (36.5 C), height '4\' 11"'$  (1.499 m), weight 202 lb (91.6 kg), last menstrual period 09/20/2011, SpO2 96 %. Body mass index is 40.8 kg/m.  General: Cooperative, alert, well developed, in no acute distress. HEENT: Conjunctivae and lids unremarkable. Cardiovascular: Regular rhythm.  Lungs: Normal work of breathing. Neurologic: No focal deficits.   Lab Results  Component Value Date   CREATININE 0.62 08/09/2021   BUN 13 08/09/2021   NA 143 08/09/2021   K 4.7 08/09/2021   CL 104 08/09/2021   CO2 25 08/09/2021   Lab Results  Component Value Date   ALT 19 08/09/2021   AST 15 08/09/2021   ALKPHOS 87 08/09/2021   BILITOT 0.2 08/09/2021   Lab Results  Component Value Date   HGBA1C 6.0 (H) 12/14/2021   HGBA1C 6.4 (H) 08/09/2021   Lab Results  Component Value Date   INSULIN 29.6 (H) 08/09/2021   Lab Results  Component Value Date   TSH 1.730 08/09/2021   Lab Results  Component Value Date   CHOL 210 (  H) 08/09/2021   HDL 48 08/09/2021   LDLCALC 140 (H) 08/09/2021   TRIG 121 08/09/2021   Lab Results  Component Value Date   VD25OH 58.8 12/14/2021   VD25OH 26.1 (L) 08/09/2021   VD25OH 26 (L) 10/08/2015   Lab Results  Component Value Date   WBC 5.7 08/09/2021   HGB 14.7 08/09/2021   HCT 45.8 08/09/2021   MCV 85 08/09/2021   PLT 255 08/09/2021    Attestation Statements:   Reviewed by clinician on day of visit: allergies,  medications, problem list, medical history, surgical history, family history, social history, and previous encounter notes.  Time spent on visit including pre-visit chart review and post-visit care and charting was 30 minutes.   I, Kathlene November, BS, CMA, am acting as transcriptionist for [employer], DO.   I have reviewed the above documentation for accuracy and completeness, and I agree with the above. Marjory Sneddon, D.O.  The [place] was signed into law in 2016 which includes the topic of electronic health records.  This provides immediate access to information in MyChart.  This includes consultation notes, operative notes, office notes, lab results and pathology reports.  If you have any questions about what you read please let us know at your next visit so we can discuss your concerns and take corrective action if need be.  We are right here with you.

## 2022-02-09 DIAGNOSIS — M25552 Pain in left hip: Secondary | ICD-10-CM | POA: Diagnosis not present

## 2022-02-09 DIAGNOSIS — M1712 Unilateral primary osteoarthritis, left knee: Secondary | ICD-10-CM | POA: Diagnosis not present

## 2022-02-09 DIAGNOSIS — M1612 Unilateral primary osteoarthritis, left hip: Secondary | ICD-10-CM | POA: Diagnosis not present

## 2022-02-09 DIAGNOSIS — M25562 Pain in left knee: Secondary | ICD-10-CM | POA: Diagnosis not present

## 2022-02-15 ENCOUNTER — Ambulatory Visit (INDEPENDENT_AMBULATORY_CARE_PROVIDER_SITE_OTHER): Payer: Medicare Other | Admitting: Family Medicine

## 2022-02-15 ENCOUNTER — Encounter (INDEPENDENT_AMBULATORY_CARE_PROVIDER_SITE_OTHER): Payer: Self-pay | Admitting: Family Medicine

## 2022-02-15 VITALS — BP 134/62 | HR 58 | Temp 97.7°F | Ht 59.0 in | Wt 201.0 lb

## 2022-02-15 DIAGNOSIS — E669 Obesity, unspecified: Secondary | ICD-10-CM

## 2022-02-15 DIAGNOSIS — M1612 Unilateral primary osteoarthritis, left hip: Secondary | ICD-10-CM

## 2022-02-15 DIAGNOSIS — Z6841 Body Mass Index (BMI) 40.0 and over, adult: Secondary | ICD-10-CM

## 2022-02-15 DIAGNOSIS — I10 Essential (primary) hypertension: Secondary | ICD-10-CM

## 2022-02-15 DIAGNOSIS — K219 Gastro-esophageal reflux disease without esophagitis: Secondary | ICD-10-CM | POA: Diagnosis not present

## 2022-02-21 NOTE — Progress Notes (Signed)
Chief Complaint:   OBESITY Megan [name] is here to discuss her progress with her obesity treatment plan along with follow-up of her obesity related diagnoses. Megan [name] is on the Category 2 Plan with breakfast and lunch options and states she is following her eating plan approximately 90% of the time. Megan [name] states she is not currently exercising.  Today's visit was #: 11 Starting weight: 224 lbs Starting date: 08/09/2021 Today's weight: 201 lbs Today's date: 02/15/2022 Total lbs lost to date: 23 Total lbs lost since last in-office visit: 1  Interim History: Megan [name] saw Dr. Elmyra Ricks for left knee pain. She is scheduled for a hip replacement on January 23rd. Pt is skipping less meals.  Subjective:   1. Osteoarthritis of left hip, unspecified osteoarthritis type Megan [name] was seen by orthopedics and needs a hip replacement.  2. Essential hypertension Cardiovascular ROS: no chest pain or dyspnea on exertion. Medication: Hyzaar  3. Gastroesophageal reflux disease, unspecified whether esophagitis present Megan [name] has no concerns today. Medication: Protonix  Assessment/Plan:  No orders of the defined types were placed in this encounter.   There are no discontinued medications.   No orders of the defined types were placed in this encounter.    1. Osteoarthritis of left hip, unspecified osteoarthritis type Continue activities as tolerated. Walk in the pool, continue sitting elliptical, and golf as needed.  2. Essential hypertension BP at goal today. Megan [name] is working on healthy weight loss and exercise to improve blood pressure control. We will watch for signs of hypotension as she continues her lifestyle modifications.  3. Gastroesophageal reflux disease, unspecified whether esophagitis present Intensive lifestyle modifications are the first line treatment for this issue. We discussed several lifestyle modifications today and she will continue to work on diet, exercise and weight loss  efforts. Orders and follow up as documented in patient record.   Counseling If a person has gastroesophageal reflux disease (GERD), food and stomach acid move back up into the esophagus and cause symptoms or problems such as damage to the esophagus. Anti-reflux measures include: raising the head of the bed, avoiding tight clothing or belts, avoiding eating late at night, not lying down shortly after mealtime, and achieving weight loss. Avoid ASA, NSAID's, caffeine, alcohol, and tobacco.  OTC Pepcid and/or Tums are often very helpful for as needed use.  However, for persisting chronic or daily symptoms, stronger medications like Omeprazole may be needed. You may need to avoid foods and drinks such as: Coffee and tea (with or without caffeine). Drinks that contain alcohol. Energy drinks and sports drinks. Bubbly (carbonated) drinks or sodas. Chocolate and cocoa. Peppermint and mint flavorings. Garlic and onions. Horseradish. Spicy and acidic foods. These include peppers, chili powder, curry powder, vinegar, hot sauces, and BBQ sauce. Citrus fruit juices and citrus fruits, such as oranges, lemons, and limes. Tomato-based foods. These include red sauce, chili, salsa, and pizza with red sauce. Fried and fatty foods. These include donuts, french fries, potato chips, and high-fat dressings. High-fat meats. These include hot dogs, rib eye steak, sausage, ham, and bacon.  4. Obesity, Current BMI 40.6 Megan [name] is currently in the action stage of change. As such, her goal is to continue with weight loss efforts. She has agreed to the Category 2 Plan with breakfast and lunch options.   Megan [name] encouraged to journal daily on her "Why" to help with self motivation and being more mindful.  Exercise goals:  As is  Behavioral modification strategies: increasing lean protein intake and decreasing simple carbohydrates.  Megan [name] has agreed to follow-up with our clinic in 2-3 weeks. She was informed of the  importance of frequent follow-up visits to maximize her success with intensive lifestyle modifications for her multiple health conditions.   Objective:   Blood pressure 134/62, pulse (!) 58, temperature 97.7 F (36.5 C), temperature source Oral, height '4\' 11"'$  (1.499 m), weight 201 lb (91.2 kg), last menstrual period 09/20/2011, SpO2 96 %. Body mass index is 40.6 kg/m.  General: Cooperative, alert, well developed, in no acute distress. HEENT: Conjunctivae and lids unremarkable. Cardiovascular: Regular rhythm.  Lungs: Normal work of breathing. Neurologic: No focal deficits.   Lab Results  Component Value Date   CREATININE 0.62 08/09/2021   BUN 13 08/09/2021   NA 143 08/09/2021   K 4.7 08/09/2021   CL 104 08/09/2021   CO2 25 08/09/2021   Lab Results  Component Value Date   ALT 19 08/09/2021   AST 15 08/09/2021   ALKPHOS 87 08/09/2021   BILITOT 0.2 08/09/2021   Lab Results  Component Value Date   HGBA1C 6.0 (H) 12/14/2021   HGBA1C 6.4 (H) 08/09/2021   Lab Results  Component Value Date   INSULIN 29.6 (H) 08/09/2021   Lab Results  Component Value Date   TSH 1.730 08/09/2021   Lab Results  Component Value Date   CHOL 210 (H) 08/09/2021   HDL 48 08/09/2021   LDLCALC 140 (H) 08/09/2021   TRIG 121 08/09/2021   Lab Results  Component Value Date   VD25OH 58.8 12/14/2021   VD25OH 26.1 (L) 08/09/2021   VD25OH 26 (L) 10/08/2015   Lab Results  Component Value Date   WBC 5.7 08/09/2021   HGB 14.7 08/09/2021   HCT 45.8 08/09/2021   MCV 85 08/09/2021   PLT 255 08/09/2021    Attestation Statements:   Reviewed by clinician on day of visit: allergies, medications, problem list, medical history, surgical history, family history, social history, and previous encounter notes.  Time spent on visit including pre-visit chart review and post-visit care and charting was 30 minutes.   I, Kathlene November, BS, CMA, am acting as transcriptionist for [employer], DO.   I  have reviewed the above documentation for accuracy and completeness, and I agree with the above. Marjory Sneddon, D.O.  The [place] was signed into law in 2016 which includes the topic of electronic health records.  This provides immediate access to information in MyChart.  This includes consultation notes, operative notes, office notes, lab results and pathology reports.  If you have any questions about what you read please let us know at your next visit so we can discuss your concerns and take corrective action if need be.  We are right here with you.

## 2022-03-01 ENCOUNTER — Ambulatory Visit (INDEPENDENT_AMBULATORY_CARE_PROVIDER_SITE_OTHER): Payer: Medicare Other | Admitting: Family Medicine

## 2022-03-01 ENCOUNTER — Encounter (INDEPENDENT_AMBULATORY_CARE_PROVIDER_SITE_OTHER): Payer: Self-pay

## 2022-03-15 ENCOUNTER — Encounter (INDEPENDENT_AMBULATORY_CARE_PROVIDER_SITE_OTHER): Payer: Self-pay | Admitting: Family Medicine

## 2022-03-15 ENCOUNTER — Ambulatory Visit (INDEPENDENT_AMBULATORY_CARE_PROVIDER_SITE_OTHER): Payer: Medicare Other | Admitting: Family Medicine

## 2022-03-15 VITALS — BP 149/82 | HR 61 | Temp 98.2°F | Ht 59.0 in | Wt 200.0 lb

## 2022-03-15 DIAGNOSIS — E559 Vitamin D deficiency, unspecified: Secondary | ICD-10-CM | POA: Diagnosis not present

## 2022-03-15 DIAGNOSIS — I10 Essential (primary) hypertension: Secondary | ICD-10-CM | POA: Diagnosis not present

## 2022-03-15 DIAGNOSIS — K219 Gastro-esophageal reflux disease without esophagitis: Secondary | ICD-10-CM

## 2022-03-15 DIAGNOSIS — E669 Obesity, unspecified: Secondary | ICD-10-CM

## 2022-03-15 DIAGNOSIS — Z6841 Body Mass Index (BMI) 40.0 and over, adult: Secondary | ICD-10-CM

## 2022-03-28 NOTE — Progress Notes (Signed)
Chief Complaint:   OBESITY Megan [name] is here to discuss her progress with her obesity treatment plan along with follow-up of her obesity related diagnoses. Megan [name] is on the Category 2 Plan with breakfast and lunch options and states she is following her eating plan approximately 70% of the time. Megan [name] states she is not currently exercising.  Today's visit was #: 12 Starting weight: 224 lbs Starting date: 08/09/2021 Today's weight: 200 lbs Today's date: 03/15/2022 Total lbs lost to date: 24 Total lbs lost since last in-office visit: 1  Interim History: Patient states she has had a lot of of temptations and challenges lately, and she is glad that she has lost weight "by the [place]".  Subjective:   1. Essential hypertension Patient is not checking her blood pressure at home.  She is taking Hyzaar and she denies symptoms or concerns.  2. Vitamin D deficiency She is currently taking OTC vitamin D 6,000 IU each day. She denies nausea, vomiting or muscle weakness.  3. Gastroesophageal reflux disease, unspecified whether esophagitis present Patient has not been taking Protonix at all, only as needed.  She sees Dr. Benson Norway of GI, and she is on hyoscyamine as needed.   Assessment/Plan:  No orders of the defined types were placed in this encounter.   There are no discontinued medications.   No orders of the defined types were placed in this encounter.    1. Essential hypertension Patient is to check her blood pressure at home 2 to 3 days/week, and decreased eating out so that she can decrease her salt intake. Megan [name] will work on healthy weight loss and exercise to improve blood pressure control. We will watch for signs of hypotension as she continues her lifestyle modifications.  2. Vitamin D deficiency Low Vitamin D level contributes to fatigue and are associated with obesity, breast, and colon cancer. Patient will continue OTC Vitamin D 6,000 IU daily and will follow-up for  routine testing of Vitamin D, at least 2-3 times per year to avoid over-replacement.  3. Gastroesophageal reflux disease, unspecified whether esophagitis present Patient will continue her patient's per GI, and if symptoms worsen or does not improve she is to follow-up with GI.  She will have plain [place] on Megan [name] bread when she needs to eat blandly.  4. Obesity, Current BMI 40.5 Megan [name] is currently in the action stage of change. As such, her goal is to continue with weight loss efforts. She has agreed to the Category 2 Plan with breakfast and lunch options, with only 100 calorie snacks.   Exercise goals: All adults should avoid inactivity. Some physical activity is better than none, and adults who participate in any amount of physical activity gain some health benefits. Start walking.   Behavioral modification strategies: increasing lean protein intake and planning for success.  Megan [name] has agreed to follow-up with our clinic in 4 to 5 weeks. She was informed of the importance of frequent follow-up visits to maximize her success with intensive lifestyle modifications for her multiple health conditions.   Objective:   Blood pressure (!) 149/82, pulse 61, temperature 98.2 F (36.8 C), height '4\' 11"'$  (1.499 m), weight 200 lb (90.7 kg), last menstrual period 09/20/2011, SpO2 95 %. Body mass index is 40.4 kg/m.  General: Cooperative, alert, well developed, in no acute distress. HEENT: Conjunctivae and lids unremarkable. Cardiovascular: Regular rhythm.  Lungs: Normal work of breathing. Neurologic: No focal deficits.   Lab Results  Component Value Date   CREATININE  0.62 08/09/2021   BUN 13 08/09/2021   NA 143 08/09/2021   K 4.7 08/09/2021   CL 104 08/09/2021   CO2 25 08/09/2021   Lab Results  Component Value Date   ALT 19 08/09/2021   AST 15 08/09/2021   ALKPHOS 87 08/09/2021   BILITOT 0.2 08/09/2021   Lab Results  Component Value Date   HGBA1C 6.0 (H) 12/14/2021   HGBA1C 6.4  (H) 08/09/2021   Lab Results  Component Value Date   INSULIN 29.6 (H) 08/09/2021   Lab Results  Component Value Date   TSH 1.730 08/09/2021   Lab Results  Component Value Date   CHOL 210 (H) 08/09/2021   HDL 48 08/09/2021   LDLCALC 140 (H) 08/09/2021   TRIG 121 08/09/2021   Lab Results  Component Value Date   VD25OH 58.8 12/14/2021   VD25OH 26.1 (L) 08/09/2021   VD25OH 26 (L) 10/08/2015   Lab Results  Component Value Date   WBC 5.7 08/09/2021   HGB 14.7 08/09/2021   HCT 45.8 08/09/2021   MCV 85 08/09/2021   PLT 255 08/09/2021   No results found for: "IRON", "TIBC", "FERRITIN"  Attestation Statements:   Reviewed by clinician on day of visit: allergies, medications, problem list, medical history, surgical history, family history, social history, and previous encounter notes.   Wilhemena Durie, am acting as transcriptionist for [employer], DO.  I have reviewed the above documentation for accuracy and completeness, and I agree with the above. Marjory Sneddon, D.O.  The [place] was signed into law in 2016 which includes the topic of electronic health records.  This provides immediate access to information in MyChart.  This includes consultation notes, operative notes, office notes, lab results and pathology reports.  If you have any questions about what you read please let us know at your next visit so we can discuss your concerns and take corrective action if need be.  We are right here with you.

## 2022-04-07 DIAGNOSIS — D1801 Hemangioma of skin and subcutaneous tissue: Secondary | ICD-10-CM | POA: Diagnosis not present

## 2022-04-07 DIAGNOSIS — D225 Melanocytic nevi of trunk: Secondary | ICD-10-CM | POA: Diagnosis not present

## 2022-04-07 DIAGNOSIS — L821 Other seborrheic keratosis: Secondary | ICD-10-CM | POA: Diagnosis not present

## 2022-04-07 DIAGNOSIS — L718 Other rosacea: Secondary | ICD-10-CM | POA: Diagnosis not present

## 2022-04-07 DIAGNOSIS — I8312 Varicose veins of left lower extremity with inflammation: Secondary | ICD-10-CM | POA: Diagnosis not present

## 2022-04-07 DIAGNOSIS — Z85828 Personal history of other malignant neoplasm of skin: Secondary | ICD-10-CM | POA: Diagnosis not present

## 2022-04-07 DIAGNOSIS — L218 Other seborrheic dermatitis: Secondary | ICD-10-CM | POA: Diagnosis not present

## 2022-04-07 DIAGNOSIS — L738 Other specified follicular disorders: Secondary | ICD-10-CM | POA: Diagnosis not present

## 2022-04-07 DIAGNOSIS — I872 Venous insufficiency (chronic) (peripheral): Secondary | ICD-10-CM | POA: Diagnosis not present

## 2022-04-07 DIAGNOSIS — I8311 Varicose veins of right lower extremity with inflammation: Secondary | ICD-10-CM | POA: Diagnosis not present

## 2022-04-07 DIAGNOSIS — L564 Polymorphous light eruption: Secondary | ICD-10-CM | POA: Diagnosis not present

## 2022-04-07 DIAGNOSIS — D2239 Melanocytic nevi of other parts of face: Secondary | ICD-10-CM | POA: Diagnosis not present

## 2022-04-17 DIAGNOSIS — J4520 Mild intermittent asthma, uncomplicated: Secondary | ICD-10-CM | POA: Diagnosis not present

## 2022-04-17 DIAGNOSIS — Z01818 Encounter for other preprocedural examination: Secondary | ICD-10-CM | POA: Diagnosis not present

## 2022-04-17 DIAGNOSIS — M5441 Lumbago with sciatica, right side: Secondary | ICD-10-CM | POA: Diagnosis not present

## 2022-04-19 ENCOUNTER — Ambulatory Visit (INDEPENDENT_AMBULATORY_CARE_PROVIDER_SITE_OTHER): Payer: Medicare Other | Admitting: Physician Assistant

## 2022-04-19 ENCOUNTER — Ambulatory Visit (INDEPENDENT_AMBULATORY_CARE_PROVIDER_SITE_OTHER): Payer: Medicare Other | Admitting: Family Medicine

## 2022-04-19 ENCOUNTER — Encounter (INDEPENDENT_AMBULATORY_CARE_PROVIDER_SITE_OTHER): Payer: Self-pay

## 2022-04-19 ENCOUNTER — Encounter (INDEPENDENT_AMBULATORY_CARE_PROVIDER_SITE_OTHER): Payer: Self-pay | Admitting: Physician Assistant

## 2022-04-19 VITALS — BP 160/76 | HR 54 | Temp 97.6°F | Ht 59.0 in | Wt 204.0 lb

## 2022-04-19 DIAGNOSIS — E669 Obesity, unspecified: Secondary | ICD-10-CM | POA: Diagnosis not present

## 2022-04-19 DIAGNOSIS — Z6841 Body Mass Index (BMI) 40.0 and over, adult: Secondary | ICD-10-CM

## 2022-04-19 DIAGNOSIS — I10 Essential (primary) hypertension: Secondary | ICD-10-CM | POA: Diagnosis not present

## 2022-04-19 DIAGNOSIS — R7303 Prediabetes: Secondary | ICD-10-CM | POA: Diagnosis not present

## 2022-04-27 NOTE — Progress Notes (Signed)
Chief Complaint:   OBESITY Megan [name] is here to discuss her progress with her obesity treatment plan along with follow-up of her obesity related diagnoses. Megan [name] is on the Category 2 Plan and states she is following her eating plan approximately 94% of the time. Megan [name] states she is exercising 0 minutes 0 times per week.  Today's visit was #: 65 Starting weight: 224 lbs Starting date: 08/09/2021 Today's weight: 204 lbs Today's date: 04/19/2022 Total lbs lost to date: 20 lbs Total lbs lost since last in-office visit: 0  Interim History: Megan [name] has done well overall with weight loss. Problems with sciatica/back pain lately and this has significantly limited activity.  Getting ready for hip replacement surgery on June 13, 2022 .  Subjective:   1. Essential hypertension Blood pressure increased from today-but having sciatic/back pain issues currently.  Taking Hyzaar 100-25 mg daily with no side effects.  2. Prediabetes Last A1c was 6.0, improved but not at goal on 03/16/2022.  No medications.  Working on healthy eating plan and exercise to promote weight loss.  Assessment/Plan:   1. Essential hypertension Will follow blood pressure at home and in the clinic. Continue taking Hyzaar 100-25 mg daily.  Continue healthy eating plan and exercise as able to promote weight loss.  2. Prediabetes Will continue working on healthy eating plan and exercise as able to promote weight loss. Follow up A1C in 2-3 months.   3. Obesity, Current BMI 41.3 Megan [name] is currently in the action stage of change. As such, her goal is to continue with weight loss efforts. She has agreed to the Category 2 Plan.   Exercise goals: All adults should avoid inactivity. Some physical activity is better than none, and adults who participate in any amount of physical activity gain some health benefits.  As directed by orthopedics.  Discussed focusing on eating protein daily in preparation for upcoming  surgery.  Behavioral modification strategies: increasing lean protein intake, decreasing simple carbohydrates, increasing water intake, meal planning and cooking strategies, and keeping healthy foods in the home.  Megan [name] has agreed to follow-up with our clinic in 4 weeks. She was informed of the importance of frequent follow-up visits to maximize her success with intensive lifestyle modifications for her multiple health conditions.   Objective:   Blood pressure (!) 160/76, pulse (!) 54, temperature 97.6 F (36.4 C), height '4\' 11"'$  (1.499 m), weight 204 lb (92.5 kg), last menstrual period 09/20/2011, SpO2 96 %. Body mass index is 41.2 kg/m.  General: Cooperative, alert, well developed, in no acute distress. HEENT: Conjunctivae and lids unremarkable. Cardiovascular: Regular rhythm.  Lungs: Normal work of breathing. Neurologic: No focal deficits.   Lab Results  Component Value Date   CREATININE 0.62 08/09/2021   BUN 13 08/09/2021   NA 143 08/09/2021   K 4.7 08/09/2021   CL 104 08/09/2021   CO2 25 08/09/2021   Lab Results  Component Value Date   ALT 19 08/09/2021   AST 15 08/09/2021   ALKPHOS 87 08/09/2021   BILITOT 0.2 08/09/2021   Lab Results  Component Value Date   HGBA1C 6.0 (H) 12/14/2021   HGBA1C 6.4 (H) 08/09/2021   Lab Results  Component Value Date   INSULIN 29.6 (H) 08/09/2021   Lab Results  Component Value Date   TSH 1.730 08/09/2021   Lab Results  Component Value Date   CHOL 210 (H) 08/09/2021   HDL 48 08/09/2021   LDLCALC 140 (H) 08/09/2021   TRIG 121 08/09/2021  Lab Results  Component Value Date   VD25OH 58.8 12/14/2021   VD25OH 26.1 (L) 08/09/2021   VD25OH 26 (L) 10/08/2015   Lab Results  Component Value Date   WBC 5.7 08/09/2021   HGB 14.7 08/09/2021   HCT 45.8 08/09/2021   MCV 85 08/09/2021   PLT 255 08/09/2021   No results found for: "IRON", "TIBC", "FERRITIN"  Attestation Statements:   Reviewed by clinician on day of visit:  allergies, medications, problem list, medical history, surgical history, family history, social history, and previous encounter notes.  I, Brendell Tyus, am acting as transcriptionist for [employer], PA.  I have reviewed the above documentation for accuracy and completeness, and I agree with the above. -  Permelia Bamba,PA-C

## 2022-05-08 ENCOUNTER — Ambulatory Visit (INDEPENDENT_AMBULATORY_CARE_PROVIDER_SITE_OTHER): Payer: Medicare Other | Admitting: Family Medicine

## 2022-05-08 ENCOUNTER — Encounter (INDEPENDENT_AMBULATORY_CARE_PROVIDER_SITE_OTHER): Payer: Self-pay | Admitting: Family Medicine

## 2022-05-08 VITALS — BP 184/79 | HR 72 | Temp 98.3°F | Ht 59.0 in | Wt 202.0 lb

## 2022-05-08 DIAGNOSIS — E7849 Other hyperlipidemia: Secondary | ICD-10-CM | POA: Diagnosis not present

## 2022-05-08 DIAGNOSIS — E559 Vitamin D deficiency, unspecified: Secondary | ICD-10-CM

## 2022-05-08 DIAGNOSIS — R7303 Prediabetes: Secondary | ICD-10-CM

## 2022-05-08 DIAGNOSIS — I10 Essential (primary) hypertension: Secondary | ICD-10-CM

## 2022-05-08 DIAGNOSIS — R638 Other symptoms and signs concerning food and fluid intake: Secondary | ICD-10-CM | POA: Insufficient documentation

## 2022-05-08 DIAGNOSIS — E669 Obesity, unspecified: Secondary | ICD-10-CM

## 2022-05-08 DIAGNOSIS — E7841 Elevated Lipoprotein(a): Secondary | ICD-10-CM

## 2022-05-08 DIAGNOSIS — Z6841 Body Mass Index (BMI) 40.0 and over, adult: Secondary | ICD-10-CM

## 2022-05-08 MED ORDER — TOPIRAMATE 50 MG PO TABS: ORAL_TABLET | ORAL | 0 refills | Status: DC

## 2022-05-08 MED ORDER — AMLODIPINE BESYLATE 2.5 MG PO TABS: 2.5000 mg | ORAL_TABLET | Freq: Every day | ORAL | 0 refills | Status: DC

## 2022-05-24 NOTE — Progress Notes (Signed)
Chief Complaint:   OBESITY Megan [name] is here to discuss her progress with her obesity treatment plan along with follow-up of her obesity related diagnoses. Megan [name] is on the Category 2 Plan and states she is following her eating plan approximately 70% of the time. Megan [name] states she is walking over the course of the day 7 times per week.  Today's visit was #: 56 Starting weight: 224 LBS Starting date: 08/09/2021 Today's weight: 202 LBS Today's date: 05/08/2022 Total lbs lost to date: 22 LBS Total lbs lost since last in-office visit: 2 LBS  Interim History: Patient will have surgery on her left hip January 23 Dr. Maureen Ralphs.  Patient is lost because she paid attention to her body more.  Focused on protein intake.  She lost more weight, but yesterday had a little splurge.  Subjective:   1. Other hyperlipidemia Patient is not taking any cholesterol medication.  LDL at 140 prior.  2. Vitamin D deficiency Patient last vitamin D level 58.8 about 3 to 4 months ago.  Patient is taking OTC vitamin D 10,000 IU daily.  3. Essential hypertension At home blood pressure is 140s/80s or 70s.  Seen by preop provider it was 140s/80s and occasionally 150s/80s.  4. Abnormal craving- carbs Positive for carb cravings i.e., crackers, cake, pita chips etc.  Did not tolerate metformin in the past due to loose stools.  5. Prediabetes Last A1c was 6.0 about 4 months ago.  Needs recheck.  Assessment/Plan:   Orders Placed This Encounter  Procedures   VITAMIN D 25 Hydroxy (Vit-D Deficiency, Fractures)   Lipid panel   Comprehensive metabolic panel   Hemoglobin A1c   Insulin, random   CBC with Differential/Platelet    There are no discontinued medications.   Meds ordered this encounter  Medications   topiramate (TOPAMAX) 50 MG tablet    Sig: One half tab by mouth daily for a week, then 1/2 tab twice daily    Dispense:  30 tablet    Refill:  0   amLODipine (NORVASC) 2.5 MG tablet    Sig: Take 1  tablet (2.5 mg total) by mouth daily.    Dispense:  30 tablet    Refill:  0     1. Other hyperlipidemia Continue PNP, weight loss and increased exercise.  Decrease saturated and trans fats.  Check labs today.  - Lipid panel  2. Vitamin D deficiency Continue OTC 10,000 IU vitamin D daily.  Counseling done.  Check labs today. - VITAMIN D 25 Hydroxy (Vit-D Deficiency, Fractures)  3. Essential hypertension Start low-dose amlodipine 2.5 mg daily.  Continue Hyzaar, check CMP before next office visit.  Decrease salt intake and continue PNP and weight loss.  Check labs today.  Start- amLODipine (NORVASC) 2.5 MG tablet; Take 1 tablet (2.5 mg total) by mouth daily.  Dispense: 30 tablet; Refill: 0  - Comprehensive metabolic panel  4. Abnormal craving- carbs Increase protein intake and decrease simple carbs.  Start- topiramate (TOPAMAX) 50 MG tablet; One half tab by mouth daily for a week, then 1/2 tab twice daily  Dispense: 30 tablet; Refill: 0  5. Prediabetes Continue PNP and weight loss.Megan [name] will continue to work on weight loss, exercise, and decreasing simple carbohydrates to help decrease the risk of diabetes.  Check labs today.  - Hemoglobin A1c - Insulin, random  6. Obesity, Current BMI 40.8 Holiday eating strategies discussed with patient.  Megan [name] is currently in the action stage of change. As such, her goal is  to continue with weight loss efforts. She has agreed to the Category 2 Plan.   Exercise goals:  As is.  Continue 10,000 steps per day.  Behavioral modification strategies: holiday eating strategies , celebration eating strategies, and avoiding temptations.  Megan [name] has agreed to follow-up with our clinic in 3 weeks. She was informed of the importance of frequent follow-up visits to maximize her success with intensive lifestyle modifications for her multiple health conditions.   Megan [name] was informed we would discuss her lab results at her next visit unless there is a  critical issue that needs to be addressed sooner. Megan [name] agreed to keep her next visit at the agreed upon time to discuss these results.  Objective:   Blood pressure (!) 184/79, pulse 72, temperature 98.3 F (36.8 C), height '4\' 11"'$  (1.499 m), weight 202 lb (91.6 kg), last menstrual period 09/20/2011, SpO2 97 %. Body mass index is 40.8 kg/m.  General: Cooperative, alert, well developed, in no acute distress. HEENT: Conjunctivae and lids unremarkable. Cardiovascular: Regular rhythm.  Lungs: Normal work of breathing. Neurologic: No focal deficits.   Lab Results  Component Value Date   CREATININE 0.62 08/09/2021   BUN 13 08/09/2021   NA 143 08/09/2021   K 4.7 08/09/2021   CL 104 08/09/2021   CO2 25 08/09/2021   Lab Results  Component Value Date   ALT 19 08/09/2021   AST 15 08/09/2021   ALKPHOS 87 08/09/2021   BILITOT 0.2 08/09/2021   Lab Results  Component Value Date   HGBA1C 6.0 (H) 12/14/2021   HGBA1C 6.4 (H) 08/09/2021   Lab Results  Component Value Date   INSULIN 29.6 (H) 08/09/2021   Lab Results  Component Value Date   TSH 1.730 08/09/2021   Lab Results  Component Value Date   CHOL 210 (H) 08/09/2021   HDL 48 08/09/2021   LDLCALC 140 (H) 08/09/2021   TRIG 121 08/09/2021   Lab Results  Component Value Date   VD25OH 58.8 12/14/2021   VD25OH 26.1 (L) 08/09/2021   VD25OH 26 (L) 10/08/2015   Lab Results  Component Value Date   WBC 5.7 08/09/2021   HGB 14.7 08/09/2021   HCT 45.8 08/09/2021   MCV 85 08/09/2021   PLT 255 08/09/2021   No results found for: "IRON", "TIBC", "FERRITIN"  Attestation Statements:   Reviewed by clinician on day of visit: allergies, medications, problem list, medical history, surgical history, family history, social history, and previous encounter notes.  I, Davy Pique, RMA, am acting as transcriptionist for [employer], DO. -   I have reviewed the above documentation for accuracy and completeness, and I agree with the  above. Marjory Sneddon, D.O.  The [place] was signed into law in 2016 which includes the topic of electronic health records.  This provides immediate access to information in MyChart.  This includes consultation notes, operative notes, office notes, lab results and pathology reports.  If you have any questions about what you read please let us know at your next visit so we can discuss your concerns and take corrective action if need be.  We are right here with you.

## 2022-05-29 ENCOUNTER — Other Ambulatory Visit (INDEPENDENT_AMBULATORY_CARE_PROVIDER_SITE_OTHER): Payer: Self-pay | Admitting: Family Medicine

## 2022-05-29 DIAGNOSIS — R638 Other symptoms and signs concerning food and fluid intake: Secondary | ICD-10-CM

## 2022-05-30 ENCOUNTER — Other Ambulatory Visit (INDEPENDENT_AMBULATORY_CARE_PROVIDER_SITE_OTHER): Payer: Self-pay | Admitting: Family Medicine

## 2022-05-30 DIAGNOSIS — I10 Essential (primary) hypertension: Secondary | ICD-10-CM

## 2022-05-30 DIAGNOSIS — R638 Other symptoms and signs concerning food and fluid intake: Secondary | ICD-10-CM

## 2022-05-31 ENCOUNTER — Other Ambulatory Visit (INDEPENDENT_AMBULATORY_CARE_PROVIDER_SITE_OTHER): Payer: Self-pay

## 2022-05-31 DIAGNOSIS — I10 Essential (primary) hypertension: Secondary | ICD-10-CM | POA: Diagnosis not present

## 2022-05-31 DIAGNOSIS — E559 Vitamin D deficiency, unspecified: Secondary | ICD-10-CM | POA: Diagnosis not present

## 2022-05-31 DIAGNOSIS — R7303 Prediabetes: Secondary | ICD-10-CM | POA: Diagnosis not present

## 2022-05-31 DIAGNOSIS — E7841 Elevated Lipoprotein(a): Secondary | ICD-10-CM | POA: Diagnosis not present

## 2022-05-31 DIAGNOSIS — E7849 Other hyperlipidemia: Secondary | ICD-10-CM | POA: Diagnosis not present

## 2022-05-31 DIAGNOSIS — R5383 Other fatigue: Secondary | ICD-10-CM | POA: Diagnosis not present

## 2022-06-01 LAB — CBC WITH DIFFERENTIAL/PLATELET
Basophils Absolute: 0 10*3/uL (ref 0.0–0.2)
Basos: 1 %
EOS (ABSOLUTE): 0.2 10*3/uL (ref 0.0–0.4)
Eos: 3 %
Hematocrit: 46.5 % (ref 34.0–46.6)
Hemoglobin: 15.1 g/dL (ref 11.1–15.9)
Immature Grans (Abs): 0 10*3/uL (ref 0.0–0.1)
Immature Granulocytes: 0 %
Lymphocytes Absolute: 1.6 10*3/uL (ref 0.7–3.1)
Lymphs: 27 %
MCH: 27.9 pg (ref 26.6–33.0)
MCHC: 32.5 g/dL (ref 31.5–35.7)
MCV: 86 fL (ref 79–97)
Monocytes Absolute: 0.4 10*3/uL (ref 0.1–0.9)
Monocytes: 7 %
Neutrophils Absolute: 3.6 10*3/uL (ref 1.4–7.0)
Neutrophils: 62 %
Platelets: 228 10*3/uL (ref 150–450)
RBC: 5.42 x10E6/uL — ABNORMAL HIGH (ref 3.77–5.28)
RDW: 14.1 % (ref 11.7–15.4)
WBC: 5.8 10*3/uL (ref 3.4–10.8)

## 2022-06-01 LAB — COMPREHENSIVE METABOLIC PANEL
ALT: 20 IU/L (ref 0–32)
AST: 15 IU/L (ref 0–40)
Albumin/Globulin Ratio: 2.3 — ABNORMAL HIGH (ref 1.2–2.2)
Albumin: 4.6 g/dL (ref 3.9–4.9)
Alkaline Phosphatase: 74 IU/L (ref 44–121)
BUN/Creatinine Ratio: 53 — ABNORMAL HIGH (ref 12–28)
BUN: 29 mg/dL — ABNORMAL HIGH (ref 8–27)
Bilirubin Total: 0.3 mg/dL (ref 0.0–1.2)
CO2: 25 mmol/L (ref 20–29)
Calcium: 9.8 mg/dL (ref 8.7–10.3)
Chloride: 103 mmol/L (ref 96–106)
Creatinine, Ser: 0.55 mg/dL — ABNORMAL LOW (ref 0.57–1.00)
Globulin, Total: 2 g/dL (ref 1.5–4.5)
Glucose: 101 mg/dL — ABNORMAL HIGH (ref 70–99)
Potassium: 4 mmol/L (ref 3.5–5.2)
Sodium: 140 mmol/L (ref 134–144)
Total Protein: 6.6 g/dL (ref 6.0–8.5)
eGFR: 99 mL/min/{1.73_m2} (ref >59)

## 2022-06-01 LAB — LIPID PANEL
Chol/HDL Ratio: 4.8 ratio — ABNORMAL HIGH (ref 0.0–4.4)
Cholesterol, Total: 237 mg/dL — ABNORMAL HIGH (ref 100–199)
HDL: 49 mg/dL (ref >39)
LDL Chol Calc (NIH): 159 mg/dL — ABNORMAL HIGH (ref 0–99)
Triglycerides: 160 mg/dL — ABNORMAL HIGH (ref 0–149)
VLDL Cholesterol Cal: 29 mg/dL (ref 5–40)

## 2022-06-01 LAB — HEMOGLOBIN A1C
Est. average glucose Bld gHb Est-mCnc: 140 mg/dL
Hgb A1c MFr Bld: 6.5 % — ABNORMAL HIGH (ref 4.8–5.6)

## 2022-06-01 LAB — VITAMIN D 25 HYDROXY (VIT D DEFICIENCY, FRACTURES): Vit D, 25-Hydroxy: 50.9 ng/mL (ref 30.0–100.0)

## 2022-06-01 LAB — INSULIN, RANDOM: INSULIN: 23.4 u[IU]/mL (ref 2.6–24.9)

## 2022-06-05 ENCOUNTER — Encounter (INDEPENDENT_AMBULATORY_CARE_PROVIDER_SITE_OTHER): Payer: Self-pay | Admitting: Family Medicine

## 2022-06-05 ENCOUNTER — Ambulatory Visit (INDEPENDENT_AMBULATORY_CARE_PROVIDER_SITE_OTHER): Payer: Medicare Other | Admitting: Family Medicine

## 2022-06-05 VITALS — BP 120/84 | HR 66 | Temp 98.1°F | Ht 59.0 in | Wt 198.8 lb

## 2022-06-05 DIAGNOSIS — E1169 Type 2 diabetes mellitus with other specified complication: Secondary | ICD-10-CM | POA: Insufficient documentation

## 2022-06-05 DIAGNOSIS — Z6841 Body Mass Index (BMI) 40.0 and over, adult: Secondary | ICD-10-CM

## 2022-06-05 DIAGNOSIS — Z7985 Long-term (current) use of injectable non-insulin antidiabetic drugs: Secondary | ICD-10-CM

## 2022-06-05 DIAGNOSIS — R638 Other symptoms and signs concerning food and fluid intake: Secondary | ICD-10-CM | POA: Diagnosis not present

## 2022-06-05 DIAGNOSIS — E559 Vitamin D deficiency, unspecified: Secondary | ICD-10-CM | POA: Diagnosis not present

## 2022-06-05 DIAGNOSIS — E785 Hyperlipidemia, unspecified: Secondary | ICD-10-CM | POA: Diagnosis not present

## 2022-06-05 DIAGNOSIS — E669 Obesity, unspecified: Secondary | ICD-10-CM | POA: Insufficient documentation

## 2022-06-05 DIAGNOSIS — I152 Hypertension secondary to endocrine disorders: Secondary | ICD-10-CM

## 2022-06-05 DIAGNOSIS — E1159 Type 2 diabetes mellitus with other circulatory complications: Secondary | ICD-10-CM | POA: Diagnosis not present

## 2022-06-05 MED ORDER — TOPIRAMATE 50 MG PO TABS: ORAL_TABLET | ORAL | 0 refills | Status: DC

## 2022-06-05 MED ORDER — AMLODIPINE BESYLATE 2.5 MG PO TABS: 2.5000 mg | ORAL_TABLET | Freq: Every day | ORAL | 0 refills | Status: DC

## 2022-06-13 DIAGNOSIS — M1612 Unilateral primary osteoarthritis, left hip: Secondary | ICD-10-CM | POA: Diagnosis not present

## 2022-06-13 DIAGNOSIS — M25752 Osteophyte, left hip: Secondary | ICD-10-CM | POA: Diagnosis not present

## 2022-06-13 HISTORY — PX: HIP SURGERY: SHX245

## 2022-06-22 NOTE — Progress Notes (Signed)
Chief Complaint:   OBESITY Megan [name] is here to discuss her progress with her obesity treatment plan along with follow-up of her obesity related diagnoses. Megan [name] is on the Category 2 Plan and states she is following her eating plan approximately 80% of the time. Megan [name] states she is using elliptical 45 minutes 1 times per week.  Today's visit was #: 15 Starting weight: 224 lbs Starting date: 08/09/2021 Today's weight: 198 lbs Today's date: 06/05/2022 Total lbs lost to date: 26 Total lbs lost since last in-office visit: 4  Interim History: Here to review labs drawn last OV. Most labs are worse. Pt has been enjoying her fatty meats and sweets the past couple of months. She is having a hip replacement next week. Pt declines any new meds at this time and doesn't want to make any changes.  Subjective:   1. Hypertension associated with diabetes (Megan [name]) New. Discussed labs with patient today- CMP. Megan [name] started a low dose Norvasc at last OV and is tolerating it well with no side effects and no new symptoms. Her BP at home runs 115-130/70-80's.  2. Abnormal craving- carbs Megan [name] started Topamax at last OV and is tolerating it well. She reports it really decreased her carb cravings and cravings for "pub food."  3. Vitamin D deficiency Discussed labs with patient today. Level now at goal.  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 Discussed labs with patient today fasting insulin and A1c. New onset diabetes mellitus. Summers has lost 26 lbs and is adopted, so she has limited knowledge of family history. She endorses significant diarrhea with Metformin in the past, even with 250 mg dose. Norvella says, "I enjoyed the holiday treats and sweets."   5. Hyperlipidemia associated with type 2 diabetes mellitus ([place]) New. Worsening. Discussed labs with patient today. "I ate a lot of fatty meats the last couple of months." Megan [name] has an LDL of 159 now (was at 140) and triglycerides increased  to 160 from 121.  Assessment/Plan:  No orders of the defined types were placed in this encounter.   Medications Discontinued During This Encounter  Medication Reason   topiramate (TOPAMAX) 50 MG tablet Reorder   amLODipine (NORVASC) 2.5 MG tablet Reorder     Meds ordered this encounter  Medications   amLODipine (NORVASC) 2.5 MG tablet    Sig: Take 1 tablet (2.5 mg total) by mouth daily.    Dispense:  30 tablet    Refill:  0   topiramate (TOPAMAX) 50 MG tablet    Sig: One half tab by mouth daily for a week, then 1/2 tab twice daily    Dispense:  30 tablet    Refill:  0     1. Hypertension associated with diabetes ([place]) BP at goal now. Continue current treatment plan.  Refill- amLODipine (NORVASC) 2.5 MG tablet; Take 1 tablet (2.5 mg total) by mouth daily.  Dispense: 30 tablet; Refill: 0  2. Abnormal craving- carbs Continue current treatment plan.  Refill- topiramate (TOPAMAX) 50 MG tablet; One half tab by mouth daily for a week, then 1/2 tab twice daily  Dispense: 30 tablet; Refill: 0  3. Vitamin D deficiency Vitamin D level at goal. Continue OTC Vitamin D 10,000 IU daily.  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 Extensive discussion with pt. Mckalyn encouraged to follow up with PCP regarding new onset diabetes mellitus and need for eye/foot screening. Follow prudent nutritional plan and weight loss.  Go to ADA website.  5. Hyperlipidemia associated with type 2 diabetes mellitus ([place]) Goal LDL is <70 for diabetic patients. Pt is to follow up with PCP regarding statin treatment. Decrease saturated and trans fats and red meat. Dominisha needs medication . Education provided.  6. Obesity, Current BMI 40.2 Taci is currently in the action stage of change. As such, her goal is to continue with weight loss efforts. She has agreed to the Category 2 Plan.   Follow up with PCP for new onset diabetes mellitus (needs screenings) and also  medication for cholesterol.  Exercise goals: As per ortho after hip surgery with Dr. Elmyra Ricks. All adults should avoid inactivity. Some physical activity is better than none, and adults who participate in any amount of physical activity gain some health benefits.  Behavioral modification strategies: increasing lean protein intake, decreasing simple carbohydrates, and increasing water intake.  Monise has agreed to follow-up with our clinic in 2-3 weeks via telemed with Arrie Aran, [place]. She was informed of the importance of frequent follow-up visits to maximize her success with intensive lifestyle modifications for her multiple health conditions.   Objective:   Blood pressure 120/84, pulse 66, temperature 98.1 F (36.7 C), height 4' 11"$  (1.499 m), weight 198 lb 12.8 oz (90.2 kg), last menstrual period 09/20/2011, SpO2 96 %. Body mass index is 40.15 kg/m.  General: Cooperative, alert, well developed, in no acute distress. HEENT: Conjunctivae and lids unremarkable. Cardiovascular: Regular rhythm.  Lungs: Normal work of breathing. Neurologic: No focal deficits.   Lab Results  Component Value Date   CREATININE 0.55 (L) 05/31/2022   BUN 29 (H) 05/31/2022   NA 140 05/31/2022   K 4.0 05/31/2022   CL 103 05/31/2022   CO2 25 05/31/2022   Lab Results  Component Value Date   ALT 20 05/31/2022   AST 15 05/31/2022   ALKPHOS 74 05/31/2022   BILITOT 0.3 05/31/2022   Lab Results  Component Value Date   HGBA1C 6.5 (H) 05/31/2022   HGBA1C 6.0 (H) 12/14/2021   HGBA1C 6.4 (H) 08/09/2021   Lab Results  Component Value Date   INSULIN 23.4 05/31/2022   INSULIN 29.6 (H) 08/09/2021   Lab Results  Component Value Date   TSH 1.730 08/09/2021   Lab Results  Component Value Date   CHOL 237 (H) 05/31/2022   HDL 49 05/31/2022   LDLCALC 159 (H) 05/31/2022   TRIG 160 (H) 05/31/2022   CHOLHDL 4.8 (H) 05/31/2022   Lab Results  Component Value Date   VD25OH 50.9 05/31/2022   VD25OH 58.8 12/14/2021    VD25OH 26.1 (L) 08/09/2021   Lab Results  Component Value Date   WBC 5.8 05/31/2022   HGB 15.1 05/31/2022   HCT 46.5 05/31/2022   MCV 86 05/31/2022   PLT 228 05/31/2022    Attestation Statements:   Reviewed by clinician on day of visit: allergies, medications, problem list, medical history, surgical history, family history, social history, and previous encounter notes.  I, Kathlene November, BS, CMA, am acting as transcriptionist for [employer], DO.   I have reviewed the above documentation for accuracy and completeness, and I agree with the above. Marjory Sneddon, D.O.  The [place] was signed into law in 2016 which includes the topic of electronic health records.  This provides immediate access to information in MyChart.  This includes consultation notes, operative notes, office notes, lab results and pathology reports.  If you have any questions about what you read please  let us know at your next visit so we can discuss your concerns and take corrective action if need be.  We are right here with you.

## 2022-06-27 ENCOUNTER — Other Ambulatory Visit (INDEPENDENT_AMBULATORY_CARE_PROVIDER_SITE_OTHER): Payer: Self-pay | Admitting: Family Medicine

## 2022-06-27 DIAGNOSIS — E1159 Type 2 diabetes mellitus with other circulatory complications: Secondary | ICD-10-CM

## 2022-06-27 DIAGNOSIS — R638 Other symptoms and signs concerning food and fluid intake: Secondary | ICD-10-CM

## 2022-06-28 DIAGNOSIS — I10 Essential (primary) hypertension: Secondary | ICD-10-CM | POA: Diagnosis not present

## 2022-06-28 DIAGNOSIS — R7303 Prediabetes: Secondary | ICD-10-CM | POA: Diagnosis not present

## 2022-06-28 DIAGNOSIS — E782 Mixed hyperlipidemia: Secondary | ICD-10-CM | POA: Diagnosis not present

## 2022-06-29 NOTE — Progress Notes (Signed)
TeleHealth Visit:  This visit was completed with telemedicine (audio/video) technology. Megan [name] has verbally consented to this TeleHealth visit. The patient is located at home, the provider is located at home. The participants in this visit include the listed provider and patient. The visit was conducted today via MyChart video.  OBESITY Megan [name] is here to discuss her progress with her obesity treatment plan along with follow-up of her obesity related diagnoses.   Today's visit was # 16 Starting weight: 224 lbs Starting date: 08/09/2021 Weight at last in office visit: 198 lbs on 06/05/22 Total weight loss: 26 lbs at last in office visit on 06/05/22. Today's reported weight: 200 lbs   Nutrition Plan: the Category 2 Plan- 30% adherence since surgery  Current exercise: PT exercises at home daily.  Interim History:  Megan [name] had a left total hip replacement on January 23.  She is ambulating with a walker and doing well.  She reports plan adherence has been poor since her surgery.  Many of her dinners have been provided by her Sunday school class and Sheri asked them to provide meals that her kids will like.  She is raising her 38 grandchildren-26 year old boy and 82 year old girl. Usually she cooks dinner.  She is often skipping breakfast and dinner.  Appetite is now starting to return. Down to about 190 pounds after surgery but has since regained some weight.  She is going on a cruise to [place] and [place] the end of March.  Assessment/Plan:  1. Type II Diabetes Newly diagnosed with last lab panel.  PCP placed her on extended release metformin 500 mg with supper which she has been able to tolerate.  Lab Results  Component Value Date   HGBA1C 6.5 (H) 05/31/2022   HGBA1C 6.0 (H) 12/14/2021   HGBA1C 6.4 (H) 08/09/2021   Lab Results  Component Value Date   LDLCALC 159 (H) 05/31/2022   CREATININE 0.55 (L) 05/31/2022    Plan: Continue metformin extended release 500 mg daily with  supper per PCP.   2. Hypertension Hypertension well controlled.  Recently had visit with PCP concerning elevated blood pressure.  Blood pressure at PCP visit was 118/62.  Medications were taken over by PCP and continued at current dosages. Medication(s): losartan/HCTZ 100-25 mg daily, amlodipine 2.5 mg daily   BP Readings from Last 3 Encounters:  06/05/22 120/84  05/08/22 (!) 184/79  04/19/22 (!) 160/76   Lab Results  Component Value Date   CREATININE 0.55 (L) 05/31/2022   CREATININE 0.62 08/09/2021   CREATININE 0.56 09/21/2015    Plan: Continue losartan/HCTZ 100/25 mg daily, amlodipine 2.5 mg daily.   3. Obesity: Current BMI 40.1 Megan [name] is currently in the action stage of change. As such, her goal is to continue with weight loss efforts.  She has agreed to the Category 2 Plan.   1.  Increase compliance to the plan. 2.  No meal skipping. 3.  Discussed travel eating strategies.  Exercise goals: as is  Behavioral modification strategies: increasing lean protein intake, no skipping meals, meal planning and cooking strategies, and travel eating strategies.  Megan [name] has agreed to follow-up with our clinic in 2 weeks.   No orders of the defined types were placed in this encounter.   There are no discontinued medications.   No orders of the defined types were placed in this encounter.     Objective:   VITALS: Per patient if applicable, see vitals. GENERAL: Alert and in no acute distress. CARDIOPULMONARY: No increased WOB. Speaking in clear  sentences.  PSYCH: Pleasant and cooperative. Speech normal rate and rhythm. Affect is appropriate. Insight and judgement are appropriate. Attention is focused, linear, and appropriate.  NEURO: Oriented as arrived to appointment on time with no prompting.   Lab Results  Component Value Date   CREATININE 0.55 (L) 05/31/2022   BUN 29 (H) 05/31/2022   NA 140 05/31/2022   K 4.0 05/31/2022   CL 103 05/31/2022   CO2 25 05/31/2022    Lab Results  Component Value Date   ALT 20 05/31/2022   AST 15 05/31/2022   ALKPHOS 74 05/31/2022   BILITOT 0.3 05/31/2022   Lab Results  Component Value Date   HGBA1C 6.5 (H) 05/31/2022   HGBA1C 6.0 (H) 12/14/2021   HGBA1C 6.4 (H) 08/09/2021   Lab Results  Component Value Date   INSULIN 23.4 05/31/2022   INSULIN 29.6 (H) 08/09/2021   Lab Results  Component Value Date   TSH 1.730 08/09/2021   Lab Results  Component Value Date   CHOL 237 (H) 05/31/2022   HDL 49 05/31/2022   LDLCALC 159 (H) 05/31/2022   TRIG 160 (H) 05/31/2022   CHOLHDL 4.8 (H) 05/31/2022   Lab Results  Component Value Date   WBC 5.8 05/31/2022   HGB 15.1 05/31/2022   HCT 46.5 05/31/2022   MCV 86 05/31/2022   PLT 228 05/31/2022   No results found for: "IRON", "TIBC", "FERRITIN" Lab Results  Component Value Date   VD25OH 50.9 05/31/2022   VD25OH 58.8 12/14/2021   VD25OH 26.1 (L) 08/09/2021    Attestation Statements:   Reviewed by clinician on day of visit: allergies, medications, problem list, medical history, surgical history, family history, social history, and previous encounter notes. Time spent on visit including the items listed below was 30 minutes.  -preparing to see the patient (e.g., review of tests, history, previous notes) -obtaining and/or reviewing separately obtained history -counseling and educating the patient/family/caregiver -documenting clinical information in the electronic or other health record -ordering medications, tests, or procedures -independently interpreting results and communicating results to the patient/ family/caregiver -referring and communicating with other health care professionals  -care coordination   This was prepared with the assistance of [employer].  Occasional wrong-word or sound-a-like substitutions may have occurred due to the inherent limitations of voice recognition software.

## 2022-07-02 ENCOUNTER — Other Ambulatory Visit (INDEPENDENT_AMBULATORY_CARE_PROVIDER_SITE_OTHER): Payer: Self-pay | Admitting: Family Medicine

## 2022-07-02 DIAGNOSIS — R638 Other symptoms and signs concerning food and fluid intake: Secondary | ICD-10-CM

## 2022-07-03 ENCOUNTER — Telehealth (INDEPENDENT_AMBULATORY_CARE_PROVIDER_SITE_OTHER): Payer: Medicare Other | Admitting: Family Medicine

## 2022-07-03 ENCOUNTER — Encounter (INDEPENDENT_AMBULATORY_CARE_PROVIDER_SITE_OTHER): Payer: Self-pay | Admitting: Family Medicine

## 2022-07-03 DIAGNOSIS — Z7984 Long term (current) use of oral hypoglycemic drugs: Secondary | ICD-10-CM

## 2022-07-03 DIAGNOSIS — E1159 Type 2 diabetes mellitus with other circulatory complications: Secondary | ICD-10-CM | POA: Diagnosis not present

## 2022-07-03 DIAGNOSIS — E669 Obesity, unspecified: Secondary | ICD-10-CM | POA: Diagnosis not present

## 2022-07-03 DIAGNOSIS — E1169 Type 2 diabetes mellitus with other specified complication: Secondary | ICD-10-CM

## 2022-07-03 DIAGNOSIS — I152 Hypertension secondary to endocrine disorders: Secondary | ICD-10-CM

## 2022-07-03 DIAGNOSIS — Z6841 Body Mass Index (BMI) 40.0 and over, adult: Secondary | ICD-10-CM

## 2022-07-13 DIAGNOSIS — E119 Type 2 diabetes mellitus without complications: Secondary | ICD-10-CM | POA: Insufficient documentation

## 2022-07-13 NOTE — Progress Notes (Signed)
TeleHealth Visit:  This visit was completed with telemedicine (audio/video) technology. Megan [name] has verbally consented to this TeleHealth visit. The patient is located at home, the provider is located at home. The participants in this visit include the listed provider and patient. The visit was conducted today via MyChart video.  OBESITY Megan [name] is here to discuss her progress with her obesity treatment plan along with follow-up of her obesity related diagnoses.   Today's visit was # 17 Starting weight: 224 lbs Starting date: 08/09/2021 Weight at last in office visit: 198 lbs on 06/05/22 Total weight loss: 26 lbs at last in office visit on 06/05/22. Today's reported weight: 203 lbs (scales are 3 lbs heavier at home)   Nutrition Plan: the Category 2 Plan   Current exercise:  PT exercises at home daily.   Interim History:  Megan [name] is doing well.  She is now cooking some meals again.  People from her church had been bringing meals because she had a hip replacement in January.  They are no longer bringing meals. She has a 33 year old grandson (she is raising him) that is a very picky eater.  This makes meal planning hard. She does well on the plan at breakfast and lunch. She is going on a family cruise March 28 and would like to lose 10 pounds by then.   Assessment/Plan:  1.  Status post left hip replacement Had left total hip replacement on January 23.  Reports she now has no pain.  She is now looking forward to get back to activities that she had stopped due to pain such as golfing. She is not going to physical therapy but is doing home exercises daily.  She is getting ready to transition to a cane.  She has some anxiety about going out of the house and being by herself at home. Plan to cook meals that do not require a lot of standing such as baking meats and microwave vegetables.  Plan: Discussed getting a 4-prong cane for more stability. Encouraged her to carry cell phone with  her at all times if she is home alone to relieve anxiety. Continue home exercises.  2. Eating disorder/emotional eating Has been on topiramate 25 mg twice daily but feels the effectiveness has worn off.  Has more cravings in the evening.  Plan: Change topiramate dose. Topiramate 25 mg tablets, take 1 tablet at lunch and 2 tablets at dinner.  3. Morbid Obesity: Current BMI 40.1 Antiobesity Medication Plan [name] is currently in the action stage of change. As such, her goal is to continue with weight loss efforts.  She has agreed to the Category 2 plan.  1.  Discussed that losing 10 pounds in a month will require very very tight adherence to the plan. 2.  Discussed she should cook what she needs for the meal plan and provide microwave options for her 23 year old grandson. 3.  Discussed options for vegetable and meat preparation.  Exercise goals: Continue PT exercises at home.  Behavioral modification strategies: increasing lean protein intake, decreasing simple carbohydrates , meal planning , and planning for success.  [name] has agreed to follow-up with our clinic in 4 weeks.   No orders of the defined types were placed in this encounter.   Medications Discontinued During This Encounter  Medication Reason   topiramate (TOPAMAX) 50 MG tablet Reorder   topiramate (TOPAMAX) 50 MG tablet      Meds ordered this encounter  Medications   DISCONTD: topiramate (TOPAMAX) 50 MG tablet  Sig: Take one tablet at lunch and 2 tablets at dinner.    Dispense:  90 tablet    Refill:  0    Order Specific Question:   Supervising Provider    Answer:   Dell Ponto [[id number]]   topiramate (TOPAMAX) 25 MG tablet    Sig: Take 1 tablet (25 mg) at lunch and 2 tablets (50 mg) at dinner.    Dispense:  90 tablet    Refill:  0    Order Specific Question:   Supervising Provider    Answer:   Dell Ponto [[id number]]      Objective:   VITALS: Per patient if applicable, see vitals. GENERAL: Alert and in  no acute distress. CARDIOPULMONARY: No increased WOB. Speaking in clear sentences.  PSYCH: Pleasant and cooperative. Speech normal rate and rhythm. Affect is appropriate. Insight and judgement are appropriate. Attention is focused, linear, and appropriate.  NEURO: Oriented as arrived to appointment on time with no prompting.   Attestation Statements:   Reviewed by clinician on day of visit: allergies, medications, problem list, medical history, surgical history, family history, social history, and previous encounter notes.   This was prepared with the assistance of Presenter, broadcasting.  Occasional wrong-word or sound-a-like substitutions may have occurred due to the inherent limitations of voice recognition software.

## 2022-07-17 ENCOUNTER — Telehealth (INDEPENDENT_AMBULATORY_CARE_PROVIDER_SITE_OTHER): Payer: Medicare Other | Admitting: Family Medicine

## 2022-07-17 ENCOUNTER — Ambulatory Visit (INDEPENDENT_AMBULATORY_CARE_PROVIDER_SITE_OTHER): Payer: Medicare Other | Admitting: Family Medicine

## 2022-07-17 ENCOUNTER — Encounter (INDEPENDENT_AMBULATORY_CARE_PROVIDER_SITE_OTHER): Payer: Self-pay | Admitting: Family Medicine

## 2022-07-17 DIAGNOSIS — Z96642 Presence of left artificial hip joint: Secondary | ICD-10-CM | POA: Insufficient documentation

## 2022-07-17 DIAGNOSIS — R638 Other symptoms and signs concerning food and fluid intake: Secondary | ICD-10-CM

## 2022-07-17 DIAGNOSIS — E1169 Type 2 diabetes mellitus with other specified complication: Secondary | ICD-10-CM

## 2022-07-17 DIAGNOSIS — Z6841 Body Mass Index (BMI) 40.0 and over, adult: Secondary | ICD-10-CM | POA: Diagnosis not present

## 2022-07-17 MED ORDER — TOPIRAMATE 25 MG PO TABS: ORAL_TABLET | ORAL | 0 refills | Status: DC

## 2022-07-17 MED ORDER — TOPIRAMATE 50 MG PO TABS: ORAL_TABLET | ORAL | 0 refills | Status: DC

## 2022-07-20 ENCOUNTER — Telehealth (INDEPENDENT_AMBULATORY_CARE_PROVIDER_SITE_OTHER): Payer: Self-pay | Admitting: Family Medicine

## 2022-07-25 DIAGNOSIS — Z5189 Encounter for other specified aftercare: Secondary | ICD-10-CM | POA: Diagnosis not present

## 2022-08-02 DIAGNOSIS — M6281 Muscle weakness (generalized): Secondary | ICD-10-CM | POA: Diagnosis not present

## 2022-08-02 DIAGNOSIS — M25552 Pain in left hip: Secondary | ICD-10-CM | POA: Diagnosis not present

## 2022-08-04 DIAGNOSIS — M25552 Pain in left hip: Secondary | ICD-10-CM | POA: Diagnosis not present

## 2022-08-04 DIAGNOSIS — M6281 Muscle weakness (generalized): Secondary | ICD-10-CM | POA: Diagnosis not present

## 2022-08-07 ENCOUNTER — Encounter (INDEPENDENT_AMBULATORY_CARE_PROVIDER_SITE_OTHER): Payer: Self-pay | Admitting: Family Medicine

## 2022-08-07 ENCOUNTER — Ambulatory Visit (INDEPENDENT_AMBULATORY_CARE_PROVIDER_SITE_OTHER): Payer: Medicare Other | Admitting: Family Medicine

## 2022-08-07 VITALS — BP 166/81 | HR 59 | Temp 97.6°F | Ht 59.0 in | Wt 202.2 lb

## 2022-08-07 DIAGNOSIS — F509 Eating disorder, unspecified: Secondary | ICD-10-CM | POA: Diagnosis not present

## 2022-08-07 DIAGNOSIS — I152 Hypertension secondary to endocrine disorders: Secondary | ICD-10-CM

## 2022-08-07 DIAGNOSIS — E1169 Type 2 diabetes mellitus with other specified complication: Secondary | ICD-10-CM

## 2022-08-07 DIAGNOSIS — M6281 Muscle weakness (generalized): Secondary | ICD-10-CM | POA: Diagnosis not present

## 2022-08-07 DIAGNOSIS — E1159 Type 2 diabetes mellitus with other circulatory complications: Secondary | ICD-10-CM

## 2022-08-07 DIAGNOSIS — M25552 Pain in left hip: Secondary | ICD-10-CM | POA: Diagnosis not present

## 2022-08-07 DIAGNOSIS — Z6841 Body Mass Index (BMI) 40.0 and over, adult: Secondary | ICD-10-CM

## 2022-08-07 NOTE — Progress Notes (Signed)
Marjory Sneddon, D.O.  ABFM, ABOM Specializing in Clinical Bariatric Medicine  Office located at: [address], [place]  [id number]     Assessment and Plan:    Medications Discontinued During This Encounter  Medication Reason   metformin (FORTAMET) 500 MG (OSM) 24 hr tablet      Morbid obesity (HCC)-start bmi 45.24/date 08/09/21 BMI 40.8 Assessment: Condition is Improving, but not optimized.. Biometric data collected today, was reviewed with patient.  Fat mass has increased by 5.8lb. Muscle mass has decreased by 2.4lb. Total body water has increased by 4.4lb.  She reports she stopped the Metformin 500mg  at the recommendation of her PCP due to GI upset/diarrhea.   Plan: Continue category 2 meal plan Discontinue Metformin 500 mg.    Hypertension associated with diabetes ([facility]) Assessment: Condition is Improving, but not optimized.. Labs were reviewed. .  Last 3 blood pressure readings in our office are as follows: BP Readings from Last 3 Encounters:  08/07/22 (!) 166/81  06/05/22 120/84  05/08/22 (!) 184/79  The CVD Risk score (D'Agostino, et al., 2008) failed to calculate for the following reasons:   CVD risk score not calculated Lab Results  Component Value Date   CREATININE 0.55 (L) 05/31/2022  She reports forgetting to take Norvasc and Hyzaar and has not been regularly checking her blood pressure at home.    Plan:BP is not at goal today. We discussed that her BP goal is under 140s/90s. -Continue Norvasc 2.5 mg and Hyzaar 100-25 mg and advised to follow up with her PCP as they may consider increasing her Norvasc dose. - Ambulatory blood pressure monitoring encouraged.  Reminded patient that if they ever feel poorly in any way, to check their blood pressure and pulse as well.  - We will continue to monitor closely alongside PCP/ specialists.  Pt reminded to also f/up with those individuals as instructed by them.  - We will continue to monitor symptoms as  they relate to the her weight loss journey.   Type 2 diabetes mellitus with other specified complication, without long-term current use of insulin (HCC) Assessment: Condition is Improving, but not optimized.. Labs were reviewed. .  Lab Results  Component Value Date   HGBA1C 6.5 (H) 05/31/2022   HGBA1C 6.0 (H) 12/14/2021   HGBA1C 6.4 (H) 08/09/2021   INSULIN 23.4 05/31/2022   INSULIN 29.6 (H) 08/09/2021  She reports that she does not check her blood sugar at home. She reports that she has not been taking Metformin as it has caused her to have diarrhea.   Plan: - Discontinue Metformin as recommended by PCP. - Continue to decrease simple carbs/ sugars; increase fiber and proteins -> follow her meal plan.   Angelis Kazan will continue to work on weight loss, exercise, via their meal plan we devised to help decrease the risk of progressing to diabetes.  - We will recheck A1c and fasting insulin level in approximately 3 months from last check, or as deemed appropriate.    Eating disorder- Emotional Eating  Assessment: Condition is Improving, but not optimized.. Denies any SI/HI. Mood is stable. She reports that this past week has been the first time her and her husband have been alone without children at home over the past year. She has had significant stressors which have contributed to her cravings and emotional eating. She reports that she typically takes the Topamax 25mg  at lunch but often forgets to take the 50mg  dose in the evenings.  Plan:  -  Continue Topamax as prescribed. - Importance of following up with PCP and others was stressed  - Reminded patient of the importance of following their prudent nutrition plan and how food can affect mood as well to support emotional wellbeing.  - We will continue to monitor closely alongside PCP / other specialists.    TREATMENT PLAN FOR OBESITY:  Recommended Dietary Goals Hasana is currently in the action stage of change. As such, her goal is  to continue weight management plan. She has agreed to continue the Category 2 Plan.  Behavioral Intervention Additional resources provided today: patient declined Evidence-based interventions for health behavior change were utilized today including the discussion of self monitoring techniques, problem-solving barriers and SMART goal setting techniques.   Regarding patient's less desirable eating habits and patterns, we employed the technique of small changes.  Pt will specifically work on: Update medication list with PCP and bring this updated list to next OV, check blood pressure regularly and bring in log to next OV, advised her not to skip meals for next visit.    Recommended Physical Activity Goals Shauntae has been advised to work up to 150 minutes of moderate intensity aerobic activity a week and strengthening exercises 2-3 times per week for cardiovascular health, weight loss maintenance and preservation of muscle mass.  She has agreed to Continue current level of physical activity    FOLLOW UP: 3-4 weeks. She was informed of the importance of frequent follow up visits to maximize her success with intensive lifestyle modifications for her multiple health conditions.  Subjective:   Chief complaint: Obesity Kelsea is here to discuss her progress with her obesity treatment plan. She is on the the Category 2 Plan and states she is following her eating plan approximately 50% of the time. She states she is exercising 45 minutes of physical therapy 2-3 days and walking 5 days per week.  Interval History:  ANNIECE DEPERALTA is here for a follow up office visit. We reviewed her meal plan and all questions were answered. Patient's food recall appears to be accurate and consistent with what is on plan when she is following it. When eating on plan, her hunger and cravings are well controlled.     Since last office visit she is feeling well overall, but has not following to her meal plan very well.  She reports skipping some meals either lunch or dinner but states she always has breakfast. She states that she typically craves salty/crunchy foods. She also reports she occasionally forgets to take her medication including last 2 doses of Topamax. She also reports forgetting to take Norvasc and Hyzaar and has not been regularly measuring her blood pressure.   She reports that she's had hip replacement surgery on January 23 and has since only occasionally needed her cane. She states that she's been doing her physical therapy 3x a week and is compliant with her home exercises. She states she has an upcoming cruise with her family soon.   She states that she has had a lot of stress related to caring for her grandchildren.   Review of Systems:  Pertinent positives were addressed with patient today.  Weight Summary and Biometrics   Weight Lost Since Last Visit: 0  Weight Gained Since Last Visit: 4lb   Vitals Temp: 97.6 F (36.4 C) BP: (!) 166/81 Pulse Rate: (!) 59 SpO2: 94 %   Anthropometric Measurements Height: 4\' 11"  (1.499 m) Weight: 202 lb 3.2 oz (91.7 kg) BMI (Calculated): 40.82 Weight at  Last Visit: 198lb Weight Lost Since Last Visit: 0 Weight Gained Since Last Visit: 4lb Starting Weight: 224lb Total Weight Loss (lbs): 22 lb (9.979 kg) Peak Weight: 234lb   Body Composition  Body Fat %: 50.3 % Fat Mass (lbs): 101.8 lbs Muscle Mass (lbs): 95.2 lbs Total Body Water (lbs): 77 lbs Visceral Fat Rating : 17   Other Clinical Data Fasting: no Labs: no Today's Visit #: 18 Starting Date: 08/09/21    Objective:   PHYSICAL EXAM:  Blood pressure (!) 166/81, pulse (!) 59, temperature 97.6 F (36.4 C), height 4\' 11"  (1.499 m), weight 202 lb 3.2 oz (91.7 kg), last menstrual period 09/20/2011, SpO2 94 %. Body mass index is 40.84 kg/m.  General: Well Developed, well nourished, and in no acute distress.  HEENT: Normocephalic, atraumatic Skin: Warm and dry, cap RF less 2  sec, good turgor Chest:  Normal excursion, shape, no gross abn Respiratory: speaking in full sentences, no conversational dyspnea NeuroM-Sk: Ambulates w/o assistance, moves * 4 Psych: A and O *3, insight good, mood-full  DIAGNOSTIC DATA REVIEWED:  BMET    Component Value Date/Time   NA 140 05/31/2022 0822   K 4.0 05/31/2022 0822   CL 103 05/31/2022 0822   CO2 25 05/31/2022 0822   GLUCOSE 101 (H) 05/31/2022 0822   GLUCOSE 103 (H) 09/21/2015 1635   BUN 29 (H) 05/31/2022 0822   CREATININE 0.55 (L) 05/31/2022 0822   CALCIUM 9.8 05/31/2022 0822   GFRNONAA >60 09/21/2015 1635   GFRAA >60 09/21/2015 1635   Lab Results  Component Value Date   HGBA1C 6.5 (H) 05/31/2022   HGBA1C 6.4 (H) 08/09/2021   Lab Results  Component Value Date   INSULIN 23.4 05/31/2022   INSULIN 29.6 (H) 08/09/2021   Lab Results  Component Value Date   TSH 1.730 08/09/2021   CBC    Component Value Date/Time   WBC 5.8 05/31/2022 0822   WBC 7.6 09/21/2015 1635   RBC 5.42 (H) 05/31/2022 0822   RBC 4.86 09/21/2015 1635   HGB 15.1 05/31/2022 0822   HCT 46.5 05/31/2022 0822   PLT 228 05/31/2022 0822   MCV 86 05/31/2022 0822   MCH 27.9 05/31/2022 0822   MCH 27.2 09/21/2015 1635   MCHC 32.5 05/31/2022 0822   MCHC 32.3 09/21/2015 1635   RDW 14.1 05/31/2022 0822   Iron Studies No results found for: "IRON", "TIBC", "FERRITIN", "IRONPCTSAT" Lipid Panel     Component Value Date/Time   CHOL 237 (H) 05/31/2022 0822   TRIG 160 (H) 05/31/2022 0822   HDL 49 05/31/2022 0822   CHOLHDL 4.8 (H) 05/31/2022 0822   LDLCALC 159 (H) 05/31/2022 0822   Hepatic Function Panel     Component Value Date/Time   PROT 6.6 05/31/2022 0822   ALBUMIN 4.6 05/31/2022 0822   AST 15 05/31/2022 0822   ALT 20 05/31/2022 0822   ALKPHOS 74 05/31/2022 0822   BILITOT 0.3 05/31/2022 0822      Component Value Date/Time   TSH 1.730 08/09/2021 1012   Nutritional Lab Results  Component Value Date   VD25OH 50.9 05/31/2022    VD25OH 58.8 12/14/2021   VD25OH 26.1 (L) 08/09/2021    Attestations:   Reviewed by clinician on day of visit: allergies, medications, problem list, medical history, surgical history, family history, social history, and previous encounter notes.   I,Alexis Herring,acting as a Education administrator for [employer], DO.,have documented all relevant documentation on the behalf of Mellody Dance, DO,as directed by  [employer]  Shloma Roggenkamp, DO while in the presence of Mellody Dance, DO.   I, Mellody Dance, DO, have reviewed all documentation for this visit. The documentation on 08/07/22 for the exam, diagnosis, procedures, and orders are all accurate and complete.

## 2022-08-08 DIAGNOSIS — I10 Essential (primary) hypertension: Secondary | ICD-10-CM | POA: Diagnosis not present

## 2022-08-08 DIAGNOSIS — R053 Chronic cough: Secondary | ICD-10-CM | POA: Diagnosis not present

## 2022-08-08 DIAGNOSIS — Z7184 Encounter for health counseling related to travel: Secondary | ICD-10-CM | POA: Diagnosis not present

## 2022-08-08 DIAGNOSIS — J45909 Unspecified asthma, uncomplicated: Secondary | ICD-10-CM | POA: Diagnosis not present

## 2022-08-08 DIAGNOSIS — E119 Type 2 diabetes mellitus without complications: Secondary | ICD-10-CM | POA: Diagnosis not present

## 2022-08-11 DIAGNOSIS — M6281 Muscle weakness (generalized): Secondary | ICD-10-CM | POA: Diagnosis not present

## 2022-08-11 DIAGNOSIS — M25562 Pain in left knee: Secondary | ICD-10-CM | POA: Diagnosis not present

## 2022-08-12 ENCOUNTER — Other Ambulatory Visit (INDEPENDENT_AMBULATORY_CARE_PROVIDER_SITE_OTHER): Payer: Self-pay | Admitting: Family Medicine

## 2022-08-12 DIAGNOSIS — R638 Other symptoms and signs concerning food and fluid intake: Secondary | ICD-10-CM

## 2022-08-15 DIAGNOSIS — M25562 Pain in left knee: Secondary | ICD-10-CM | POA: Diagnosis not present

## 2022-08-15 DIAGNOSIS — M6281 Muscle weakness (generalized): Secondary | ICD-10-CM | POA: Diagnosis not present

## 2022-08-30 DIAGNOSIS — M6281 Muscle weakness (generalized): Secondary | ICD-10-CM | POA: Diagnosis not present

## 2022-08-30 DIAGNOSIS — M25552 Pain in left hip: Secondary | ICD-10-CM | POA: Diagnosis not present

## 2022-09-04 ENCOUNTER — Encounter (INDEPENDENT_AMBULATORY_CARE_PROVIDER_SITE_OTHER): Payer: Self-pay | Admitting: Family Medicine

## 2022-09-04 ENCOUNTER — Ambulatory Visit (INDEPENDENT_AMBULATORY_CARE_PROVIDER_SITE_OTHER): Payer: Medicare Other | Admitting: Family Medicine

## 2022-09-04 VITALS — BP 130/77 | HR 61 | Temp 98.6°F | Ht 59.0 in | Wt 204.0 lb

## 2022-09-04 DIAGNOSIS — E1169 Type 2 diabetes mellitus with other specified complication: Secondary | ICD-10-CM

## 2022-09-04 DIAGNOSIS — Z7985 Long-term (current) use of injectable non-insulin antidiabetic drugs: Secondary | ICD-10-CM | POA: Diagnosis not present

## 2022-09-04 DIAGNOSIS — E1159 Type 2 diabetes mellitus with other circulatory complications: Secondary | ICD-10-CM | POA: Diagnosis not present

## 2022-09-04 DIAGNOSIS — I152 Hypertension secondary to endocrine disorders: Secondary | ICD-10-CM

## 2022-09-04 DIAGNOSIS — F509 Eating disorder, unspecified: Secondary | ICD-10-CM

## 2022-09-04 DIAGNOSIS — Z6841 Body Mass Index (BMI) 40.0 and over, adult: Secondary | ICD-10-CM

## 2022-09-04 MED ORDER — TIRZEPATIDE 2.5 MG/0.5ML ~~LOC~~ SOAJ: 2.5000 mg | SUBCUTANEOUS | 0 refills | Status: DC

## 2022-09-04 NOTE — Progress Notes (Signed)
Megan [name], D.O.  ABFM, ABOM Specializing in Clinical Bariatric Medicine  Office located at: [address] [place], [place]  [address]     Assessment and Plan:   No orders of the defined types were placed in this encounter.   There are no discontinued medications.   Meds ordered this encounter  Medications   tirzepatide (MOUNJARO) 2.5 MG/0.5ML Pen    Sig: Inject 2.5 mg into the skin once a week.    Dispense:  2 mL    Refill:  0    Type 2 diabetes mellitus with obesity Assessment: Condition is Not optimized. Lab Results  Component Value Date   HGBA1C 6.5 (H) 05/31/2022   HGBA1C 6.0 (H) 12/14/2021   HGBA1C 6.4 (H) 08/09/2021   INSULIN 23.4 05/31/2022   INSULIN 29.6 (H) 08/09/2021  Patient is not currently on any medication for this condition. On 08/07/2022, I discontinued the Metformin because it caused the patient to have GI upset (extremely loose stools), unable to tolerate it.  Patients PCP preferred [place] to manage her weight loss medication. Patient endorses having some hunger and cravings.Patient denies a personal history of pancreatitis;  and is not sure about having a family history of medullary thyroid carcinoma or multiple endocrine neoplasia type II because she was adopted.  Plan: Begin Mounjaro. Risks/benefits of medication were discussed with patient. Counseling was also done on how to use the [facility] pen.  Continue her prudent nutritional plan that is low in simple carbohydrates, saturated fats and trans fats to goal of 5-10% weight loss to achieve significant health benefits.  Pt encouraged to continually advance exercise and cardiovascular fitness as tolerated throughout weight loss journey.    Hypertension associated with diabetes Assessment: Condition is improving, but not optimized.  Last 3 blood pressure readings in our office are as follows: BP Readings from Last 3 Encounters:  09/04/22 130/77  08/07/22 (!) 166/81  06/05/22 120/84  She has  been compliant with Norvasc 2.5 mg daily and Hyzaar 100-25 mg daily. Denies any side effects. No concerns. Asymptomatic.   Plan: Continue with med as prescribed. - Avoid buying foods that are: processed, frozen, or prepackaged to avoid excess salt. - Ambulatory blood pressure monitoring encouraged.  Reminded patient that if they ever feel poorly in any way, to check their blood pressure and pulse as well. - We will continue to monitor closely alongside PCP/ specialists.  Pt reminded to also f/up with those individuals as instructed by them.  - We will continue to monitor symptoms as they relate to the her weight loss journey.    Eating disorder- emotional eating Assessment: Condition is stable. Denies any SI/HI. Mood is stable. She reports that she typically takes the Topamax 25 mg at lunch, but often forgets to take the 50 mg dose in the evenings.  Plan: Continue Topamax as prescribed.  Continue her prudent nutritional plan that is low in simple carbohydrates, saturated fats and trans fats to goal of 5-10% weight loss to achieve significant health benefits.  Pt encouraged to continually advance exercise and cardiovascular fitness as tolerated throughout weight loss journey.    TREATMENT PLAN FOR OBESITY: Adult BMI 40.0-44.9 kg/sq m (HCC)- Current BMI 40.84 Morbid obesity (HCC)-start bmi 45.24/date 08/09/21 Assessment: Condition is improving, but not optimized. Biometric data collected today, was reviewed with patient.  Fat mass has not changed. Muscle mass has increased by 2.2lb. Total body water has decreased by 1.2lb.   Plan:  [name] is currently in the  action stage of change. As such, her goal is to continue weight management plan. Megan [name] will work on healthier eating habits and try their best to continue with the Category 2 meal plan.   Behavioral Intervention Additional resources provided today: patient declined Evidence-based interventions for health behavior change were utilized  today including the discussion of self monitoring techniques, problem-solving barriers and SMART goal setting techniques.   Regarding patient's less desirable eating habits and patterns, we employed the technique of small changes.  Pt will specifically work on: starting Mounjaro  for next visit.    Recommended Physical Activity Goals Megan [name] has been advised to work up to 150 minutes of moderate intensity aerobic activity a week and strengthening exercises 2-3 times per week for cardiovascular health, weight loss maintenance and preservation of muscle mass.  She has agreed to Continue current level of physical activity   FOLLOW UP: Return in about 3 weeks (around 09/25/2022). She was informed of the importance of frequent follow up visits to maximize her success with intensive lifestyle modifications for her multiple health conditions.   Subjective:   Chief complaint: Obesity Megan [name] is here to discuss her progress with her obesity treatment plan. She is on the the Category 2 Plan and states she is following her eating plan approximately 40% of the time. She states  walked 12,000 steps daily while she was on her 12 day cruise ship.   Interval History:  Megan [name] is here for a follow up office visit. Since last office visit she returned from a wonderful 12 day cruise vacation. She endorses eating at a  lot of buffets. She wants to return to following her meal plan more strictly now.   Pharmacotherapy for weight loss: She is currently taking Topamax for medical weight loss.  Denies side effects.    Review of Systems:  Pertinent positives were addressed with patient today.   Weight Summary and Biometrics   Weight Lost Since Last Visit: 0  Weight Gained Since Last Visit: 2lb    Vitals Temp: 98.6 F (37 C) BP: 130/77 Pulse Rate: 61 SpO2: 98 %   Anthropometric Measurements Height:  (1.499 m) Weight: 204 lb (92.5 kg) BMI (Calculated): 41.18 Weight at Last Visit: 202  lb Weight Lost Since Last Visit: 0 Weight Gained Since Last Visit: 2lb Starting Weight: 224 lb Total Weight Loss (lbs): 20 lb (9.072 kg) Peak Weight: 234 lb   Body Composition  Body Fat %: 49.8 % Fat Mass (lbs): 101.8 lbs Muscle Mass (lbs): 97.4 lbs Total Body Water (lbs): 75.8 lbs Visceral Fat Rating : 17   Other Clinical Data Today's Visit #: 19 Starting Date: 08/09/21     Objective:   PHYSICAL EXAM: Blood pressure 130/77, pulse 61, temperature 98.6 F (37 C), height  (1.499 m), weight 204 lb (92.5 kg), last menstrual period 09/20/2011, SpO2 98 %. Body mass index is 41.2 kg/m.  General: Well Developed, well nourished, and in no acute distress.  HEENT: Normocephalic, atraumatic Skin: Warm and dry, cap RF less 2 sec, good turgor Chest:  Normal excursion, shape, no gross abn Respiratory: speaking in full sentences, no conversational dyspnea NeuroM-Sk: Ambulates w/o assistance, moves * 4 Psych: A and O *3, insight good, mood-full  DIAGNOSTIC DATA REVIEWED:  BMET    Component Value Date/Time   NA 140 05/31/2022 0822   K 4.0 05/31/2022 0822   CL 103 05/31/2022 0822   CO2 25 05/31/2022 0822   GLUCOSE 101 (H) 05/31/2022 8295  GLUCOSE 103 (H) 09/21/2015 1635   BUN 29 (H) 05/31/2022 0822   CREATININE 0.55 (L) 05/31/2022 0822   CALCIUM 9.8 05/31/2022 0822   GFRNONAA >60 09/21/2015 1635   GFRAA >60 09/21/2015 1635   Lab Results  Component Value Date   HGBA1C 6.5 (H) 05/31/2022   HGBA1C 6.4 (H) 08/09/2021   Lab Results  Component Value Date   INSULIN 23.4 05/31/2022   INSULIN 29.6 (H) 08/09/2021   Lab Results  Component Value Date   TSH 1.730 08/09/2021   CBC    Component Value Date/Time   WBC 5.8 05/31/2022 0822   WBC 7.6 09/21/2015 1635   RBC 5.42 (H) 05/31/2022 0822   RBC 4.86 09/21/2015 1635   HGB 15.1 05/31/2022 0822   HCT 46.5 05/31/2022 0822   PLT 228 05/31/2022 0822   MCV 86 05/31/2022 0822   MCH 27.9 05/31/2022 0822   MCH 27.2  09/21/2015 1635   MCHC 32.5 05/31/2022 0822   MCHC 32.3 09/21/2015 1635   RDW 14.1 05/31/2022 0822   Iron Studies No results found for: "IRON", "TIBC", "FERRITIN", "IRONPCTSAT" Lipid Panel     Component Value Date/Time   CHOL 237 (H) 05/31/2022 0822   TRIG 160 (H) 05/31/2022 0822   HDL 49 05/31/2022 0822   CHOLHDL 4.8 (H) 05/31/2022 0822   LDLCALC 159 (H) 05/31/2022 0822   Hepatic Function Panel     Component Value Date/Time   PROT 6.6 05/31/2022 0822   ALBUMIN 4.6 05/31/2022 0822   AST 15 05/31/2022 0822   ALT 20 05/31/2022 0822   ALKPHOS 74 05/31/2022 0822   BILITOT 0.3 05/31/2022 0822      Component Value Date/Time   TSH 1.730 08/09/2021 1012   Nutritional Lab Results  Component Value Date   VD25OH 50.9 05/31/2022   VD25OH 58.8 12/14/2021   VD25OH 26.1 (L) 08/09/2021    Attestations:   Reviewed by clinician on day of visit: allergies, medications, problem list, medical history, surgical history, family history, social history, and previous encounter notes.    I,Special Puri,acting as a Neurosurgeon for [employer], DO.,have documented all relevant documentation on the behalf of Thomasene Lot, DO,as directed by  Thomasene Lot, DO while in the presence of Thomasene Lot, DO.   I, Thomasene Lot, DO, have reviewed all documentation for this visit. The documentation on 09/04/22 for the exam, diagnosis, procedures, and orders are all accurate and complete.

## 2022-09-11 DIAGNOSIS — M25552 Pain in left hip: Secondary | ICD-10-CM | POA: Diagnosis not present

## 2022-09-11 DIAGNOSIS — M6281 Muscle weakness (generalized): Secondary | ICD-10-CM | POA: Diagnosis not present

## 2022-09-12 ENCOUNTER — Other Ambulatory Visit (HOSPITAL_COMMUNITY)
Admission: RE | Admit: 2022-09-12 | Discharge: 2022-09-12 | Disposition: A | Discharge status: Home / Self Care | Payer: Medicare Other | Source: Ambulatory Visit | Attending: Obstetrics & Gynecology | Admitting: Obstetrics & Gynecology

## 2022-09-12 ENCOUNTER — Encounter (HOSPITAL_BASED_OUTPATIENT_CLINIC_OR_DEPARTMENT_OTHER): Payer: Self-pay | Admitting: Obstetrics & Gynecology

## 2022-09-12 ENCOUNTER — Ambulatory Visit (INDEPENDENT_AMBULATORY_CARE_PROVIDER_SITE_OTHER): Payer: Medicare Other | Admitting: Obstetrics & Gynecology

## 2022-09-12 VITALS — BP 152/82 | HR 60 | Ht 59.5 in | Wt 204.0 lb

## 2022-09-12 DIAGNOSIS — Z124 Encounter for screening for malignant neoplasm of cervix: Secondary | ICD-10-CM

## 2022-09-12 DIAGNOSIS — M8580 Other specified disorders of bone density and structure, unspecified site: Secondary | ICD-10-CM | POA: Diagnosis not present

## 2022-09-12 DIAGNOSIS — Z780 Asymptomatic menopausal state: Secondary | ICD-10-CM | POA: Diagnosis not present

## 2022-09-12 DIAGNOSIS — M6208 Separation of muscle (nontraumatic), other site: Secondary | ICD-10-CM

## 2022-09-12 DIAGNOSIS — L680 Hirsutism: Secondary | ICD-10-CM | POA: Diagnosis not present

## 2022-09-12 DIAGNOSIS — Z6841 Body Mass Index (BMI) 40.0 and over, adult: Secondary | ICD-10-CM

## 2022-09-12 DIAGNOSIS — Z01419 Encounter for gynecological examination (general) (routine) without abnormal findings: Secondary | ICD-10-CM

## 2022-09-12 NOTE — Patient Instructions (Signed)
Replens vaginal moisturizer--twice weekly Coconut oil- twice weekly Vit E suppositories--Custom Care Vit E/hyaluronic acid gel - [employer]

## 2022-09-12 NOTE — Progress Notes (Signed)
72 y.o. G89P2002 Married White or Caucasian female here for breast and pelvic exam.  Denies vaginal bleeding.    Has been having left knee issues for years.  Went for a second opinion with Dr. Despina Hick.  She had hip replacement in January.  Has done really well.  Would like to be SA again.  Having lot of dryness.  Has used some [place].  Would like some suggestions.  Seeing Dr. Sharee Holster.  Has been prescribed Munjaro.  HbA1C was 6.5.  Concerned about facial hair and chest hair.  Patient's last menstrual period was 09/20/2011 (approximate).          Sexually active: No.  H/O STD:  no  Health Maintenance: PCP:  Florentina Addison, NP, in Dr. Carolee Rota office.  Last wellness appt was about a month.  Did blood work at that appt:   Vaccines are up to date:  no Colonoscopy:  08/19/2020, follow up 7 years MMG:  10/03/2021 BMD:  12/15/2021 Last pap smear:  07/08/2019 Negative.   H/o abnormal pap smear:      reports that she has never smoked. She has never used smokeless tobacco. She reports that she does not drink alcohol and does not use drugs.  Past Medical History:  Diagnosis Date   Abnormal Pap smear of cervix    just a repeat done yrs ago   Anemia    Asthma    Back pain    Basal cell carcinoma 02/07/2018   right nasal sidewall [place] MOHS   Bilateral edema of lower extremity    Cancer 04/24/2018   Nose   Constipation    Diverticulosis    Gallstones    Gastritis    acute   GERD (gastroesophageal reflux disease)    Hiatal hernia    Hip pain    Knee pain    Osteoarthritis    Other fatigue    Shortness of breath on exertion    Vitamin D deficiency     Past Surgical History:  Procedure Laterality Date   BACK SURGERY  around age [age]   BASAL CELL CARCINOMA EXCISION  04/24/2018   nose   CHOLECYSTECTOMY     HIP SURGERY Left 06/13/2022   Dr. Despina Hick   KNEE SURGERY  2012   left knee   TUBAL LIGATION  age 34    Current Outpatient Medications  Medication Sig Dispense Refill   amLODipine  (NORVASC) 2.5 MG tablet Take 1 tablet (2.5 mg total) by mouth daily. 30 tablet 0   budesonide-formoterol (SYMBICORT) 160-4.5 MCG/ACT inhaler Inhale 1 puff into the lungs daily.     Cholecalciferol (VITAMIN D PO) Take 10,000 Int'l Units by mouth daily.     cyclobenzaprine (FLEXERIL) 10 MG tablet Take 10 mg by mouth every 8 (eight) hours as needed for muscle spasms.     hyoscyamine (ANASPAZ) 0.125 MG TBDP tablet Place under the tongue as needed.     ketoconazole (NIZORAL) 2 % shampoo APPLY TO SCALP LET SIT FOR 3-5 MINUTES AND THEN RINSE. 120 mL 11   losartan-hydrochlorothiazide (HYZAAR) 100-25 MG tablet Take 1 tablet by mouth daily.     meloxicam (MOBIC) 15 MG tablet Take 15 mg by mouth daily.     mometasone (ELOCON) 0.1 % lotion Apply topically two to three times weekly. 60 mL 1   Multiple Vitamins-Minerals (MULTIVITAMIN PO) Take by mouth. Women over 40 vitamin     pantoprazole (PROTONIX) 40 MG tablet Take 40 mg by mouth daily as needed.  tirzepatide [facility]) 2.5 MG/0.5ML Pen Inject 2.5 mg into the skin once a week. 2 mL 0   topiramate (TOPAMAX) 25 MG tablet TAKE 1 TABLET (25 MG) AT LUNCH AND 2 TABLETS (50 MG) AT DINNER. 270 tablet 0   No current facility-administered medications for this visit.    Family History  Adopted: Yes  Problem Relation Age of Onset   Heart disease Mother     Review of Systems  Exam:   BP (!) 152/82 (BP Location: Right Arm, Patient Position: Sitting, Cuff Size: Normal) Comment (BP Location): lower extremity  Pulse 60   Ht 4' 11.5" (1.511 m) Comment: Reported  Wt 204 lb (92.5 kg)   LMP 09/20/2011 (Approximate)   BMI 40.51 kg/m   Height: 4' 11.5" (151.1 cm) (Reported)  General appearance: alert, cooperative and appears stated age Breasts: normal appearance, no masses or tenderness Abdomen: soft, non-tender; bowel sounds normal; no masses,  no organomegaly Lymph nodes: Cervical, supraclavicular, and axillary nodes normal.  No abnormal inguinal nodes  palpated Neurologic: Grossly normal  Pelvic: External genitalia:  no lesions              Urethra:  normal appearing urethra with no masses, tenderness or lesions              Bartholins and Skenes: normal                 Vagina: normal appearing vagina with atrophic changes and no discharge, no lesions              Cervix: no lesions              Pap taken: Yes.   Bimanual Exam:  Uterus:  normal size, contour, position, consistency, mobility, non-tender              Adnexa: normal adnexa and no mass, fullness, tenderness               Rectovaginal: Confirms               Anus:  normal sphincter tone, no lesions  Chaperone, Ina Homes, CMA, was present for exam.  Assessment/Plan: 1. Encntr for gyn exam (general) (routine) w/o abn findings - Pap smear obtained today - Mammogram 09/2021.  Pt is doing yearly. - Colonoscopy 07/2020 - Bone mineral density 2023 - lab work done with PCP and Dr. Sharee Holster - vaccines reviewed/updated  2. Cervical cancer screening - Cytology - PAP( [place]) - PR OBTAINING SCREEN PAP SMEAR  3. Hirsutism - Testosterone, Total, LC/MS/MS  4. Adult BMI 40.0-44.9 kg/sq m (HCC)- BMI 40.84 today - followed at [facility]  5. Postmenopausal - topical/OTC products for vaginal dryness discussed  6. Rectus diastasis  7.  Osteopenia

## 2022-09-15 LAB — CYTOLOGY - PAP
Adequacy: ABSENT
Diagnosis: NEGATIVE

## 2022-09-15 LAB — TESTOSTERONE, TOTAL, LC/MS/MS: Testosterone, total: 14 ng/dL (ref 7.0–40.0)

## 2022-09-18 ENCOUNTER — Encounter (INDEPENDENT_AMBULATORY_CARE_PROVIDER_SITE_OTHER): Payer: Self-pay | Admitting: Family Medicine

## 2022-09-18 ENCOUNTER — Ambulatory Visit (INDEPENDENT_AMBULATORY_CARE_PROVIDER_SITE_OTHER): Payer: Medicare Other | Admitting: Family Medicine

## 2022-09-18 VITALS — BP 124/69 | HR 97 | Ht 59.0 in | Wt 200.0 lb

## 2022-09-18 DIAGNOSIS — E1159 Type 2 diabetes mellitus with other circulatory complications: Secondary | ICD-10-CM

## 2022-09-18 DIAGNOSIS — E1169 Type 2 diabetes mellitus with other specified complication: Secondary | ICD-10-CM | POA: Diagnosis not present

## 2022-09-18 DIAGNOSIS — Z7985 Long-term (current) use of injectable non-insulin antidiabetic drugs: Secondary | ICD-10-CM

## 2022-09-18 DIAGNOSIS — F509 Eating disorder, unspecified: Secondary | ICD-10-CM | POA: Diagnosis not present

## 2022-09-18 DIAGNOSIS — I152 Hypertension secondary to endocrine disorders: Secondary | ICD-10-CM

## 2022-09-18 DIAGNOSIS — E669 Obesity, unspecified: Secondary | ICD-10-CM

## 2022-09-18 DIAGNOSIS — Z6841 Body Mass Index (BMI) 40.0 and over, adult: Secondary | ICD-10-CM

## 2022-09-18 MED ORDER — TIRZEPATIDE 2.5 MG/0.5ML ~~LOC~~ SOAJ: 2.5000 mg | SUBCUTANEOUS | 0 refills | Status: DC

## 2022-09-18 NOTE — Progress Notes (Signed)
Megan [name], D.O.  ABFM, ABOM Specializing in Clinical Bariatric Medicine  Office located at: [address] [place], [place]  [address]     Assessment and Plan:    Medications Discontinued During This Encounter  Medication Reason   tirzepatide [facility]) 2.5 MG/0.5ML Pen Reorder     Meds ordered this encounter  Medications   tirzepatide (MOUNJARO) 2.5 MG/0.5ML Pen    Sig: Inject 2.5 mg into the skin once a week.    Dispense:  2 mL    Refill:  0     Type 2 diabetes mellitus with obesity (HCC)  Assessment: Condition is Not at goal.. Labs were reviewed.  Lab Results  Component Value Date   HGBA1C 6.5 (H) 05/31/2022   HGBA1C 6.0 (H) 12/14/2021   HGBA1C 6.4 (H) 08/09/2021   INSULIN 23.4 05/31/2022   INSULIN 29.6 (H) 08/09/2021  She's complaint with Mounjaro and reports a good tolerance and denies any side effects except for chills the first time she took it. She states that it has been making her feel more full and focus on eating the healthy choices. She states that she take's it every wednesday. She states that she has stopped Metformin. She states that she hasn't been measuring her blood sugar at home.   Plan: Continue Mounjaro 2.5 mg weekly. Will refill this today. Advised to inform her PCP of her onset diabetes.  Continue her prudent nutritional plan that is low in simple carbohydrates, saturated fats and trans fats to goal of 5-10% weight loss to achieve significant health benefits.  Pt encouraged to continually advance exercise and cardiovascular fitness as tolerated throughout weight loss journey.   - Intensive lifestyle modification including diet, exercise and weight loss are the first line of treatment for diabetes. We extensively discussed the importance of decreasing simple carbs, increasing proteins and how certain foods they eat will affect their blood sugars   Hypertension associated with diabetes (HCC) Assessment: Condition is Improving, but not  optimized.. Labs were reviewed.  Last 3 blood pressure readings in our office are as follows: BP Readings from Last 3 Encounters:  09/18/22 124/69  09/12/22 (!) 152/82  09/04/22 130/77   The 10-year ASCVD risk score (Arnett DK, et al., 2019) is: 23.2%  Lab Results  Component Value Date   CREATININE 0.55 (L) 05/31/2022  She has been compliant with Norvasc 2.5 mg daily and Hyzaar 100-25 mg daily. Denies any side effects. No concerns. Asymptomatic.    Plan: Continue with med as prescribed. - Avoid buying foods that are: processed, frozen, or prepackaged to avoid excess salt. - Ambulatory blood pressure monitoring encouraged.  Reminded patient that if they ever feel poorly in any way, to check their blood pressure and pulse as well. - We will continue to monitor closely alongside PCP/ specialists.  Pt reminded to also f/up with those individuals as instructed by them.  - We will continue to monitor symptoms as they relate to the her weight loss journey.    Eating disorder- emotional eating Assessment: Condition is stable. Denies any SI/HI. Mood is stable. She reports that she typically takes the Topamax 25 mg one at lunch and 2 at dinner. She states she occasionally would crave crunchy foods.    Plan: Continue Topamax as prescribed. Recommended to try [employer] protein chips.   Continue her prudent nutritional plan that is low in simple carbohydrates, saturated fats and trans fats to goal of 5-10% weight loss to achieve significant health benefits.  Pt  encouraged to continually advance exercise and cardiovascular fitness as tolerated throughout weight loss journey.   TREATMENT PLAN FOR OBESITY: Morbid obesity (HCC)-start bmi 45.24/date 08/09/21 Assessment: Condition is docourse: improving. Biometric data collected today, was reviewed with patient.  Fat mass has decreased by 2.8 lb. Muscle mass has decreased by 1.0 lb. Total body water has increased by 0.8 lb.   Plan:  Megan [name] is currently in  the action stage of change. As such, her goal is to continue weight management plan. Megan [name] will work on healthier eating habits and try their best to follow the Continue the category 2 meal plan with breakfast and lunch options  best they can.   Behavioral Intervention Additional resources provided today: category 2 meal plan information Evidence-based interventions for health behavior change were utilized today including the discussion of self monitoring techniques, problem-solving barriers and SMART goal setting techniques.   Regarding patient's less desirable eating habits and patterns, we employed the technique of small changes.  Pt will specifically work on: increase walking when golfing for next visit.    Recommended Physical Activity Goals Megan [name] has been advised to work up to 150 minutes of moderate intensity aerobic activity a week and strengthening exercises 2-3 times per week for cardiovascular health, weight loss maintenance and preservation of muscle mass.  She has agreed to Continue current level of physical activity    FOLLOW UP: Return in about 3 weeks (around 10/09/2022). She was informed of the importance of frequent follow up visits to maximize her success with intensive lifestyle modifications for her multiple health conditions.   Subjective:   Chief complaint: Obesity Megan [name] is here to discuss her progress with her obesity treatment plan. She is on the the Category 2 Plan with breakfast and lunch options and states she is following her eating plan approximately 95% of the time. She states she is exercising 45 minutes of cardio 2-3 days per week.  Interval History:  Megan [name] is here for a follow up office visit. Since last office visit she reports feeling well and states her Megan [name] has been making her feel more full. She's been seeing her orthodontist which she see's for her knee pain. She states that has an adequate exercise intake from her physical therapy and  kids. She also states that her husband has been very supportive of her.   We reviewed her meal plan and all questions were answered. Patient's food recall appears to be accurate and consistent with what is on plan when she is following it. When eating on plan, her hunger and cravings are well controlled.      Pharmacotherapy for weight loss: She is currently taking  Mounjaro  for medical weight loss.  Denies side effects.    Review of Systems:  Pertinent positives were addressed with patient today.   Weight Summary and Biometrics   Weight Lost Since Last Visit: 4 lb  No data recorded   Vitals BP: 124/69 Pulse Rate: 97   Anthropometric Measurements Height: 4\' 11"  (1.499 m) Weight: 200 lb (90.7 kg) BMI (Calculated): 40.37 Weight at Last Visit: 204 lb Weight Lost Since Last Visit: 4 lb Starting Weight: 224 lb Total Weight Loss (lbs): 24 lb (10.9 kg) Peak Weight: 234 lb   Body Composition  Body Fat %: 49.4 % Fat Mass (lbs): 99 lbs Muscle Mass (lbs): 96.4 lbs Total Body Water (lbs): 76.6 lbs Visceral Fat Rating : 17   Other Clinical Data Fasting: No Labs: No Today's Visit #: 20 Starting  Date: 08/09/21     Objective:   PHYSICAL EXAM: Blood pressure 124/69, pulse 97, height 4\' 11"  (1.499 m), weight 200 lb (90.7 kg), last menstrual period 09/20/2011. Body mass index is 40.4 kg/m.  General: Well Developed, well nourished, and in no acute distress.  HEENT: Normocephalic, atraumatic Skin: Warm and dry, cap RF less 2 sec, good turgor Chest:  Normal excursion, shape, no gross abn Respiratory: speaking in full sentences, no conversational dyspnea NeuroM-Sk: Ambulates w/o assistance, moves * 4 Psych: A and O *3, insight good, mood-full  DIAGNOSTIC DATA REVIEWED:  BMET    Component Value Date/Time   NA 140 05/31/2022 0822   K 4.0 05/31/2022 0822   CL 103 05/31/2022 0822   CO2 25 05/31/2022 0822   GLUCOSE 101 (H) 05/31/2022 0822   GLUCOSE 103 (H) 09/21/2015  1635   BUN 29 (H) 05/31/2022 0822   CREATININE 0.55 (L) 05/31/2022 0822   CALCIUM 9.8 05/31/2022 0822   GFRNONAA >60 09/21/2015 1635   GFRAA >60 09/21/2015 1635   Lab Results  Component Value Date   HGBA1C 6.5 (H) 05/31/2022   HGBA1C 6.4 (H) 08/09/2021   Lab Results  Component Value Date   INSULIN 23.4 05/31/2022   INSULIN 29.6 (H) 08/09/2021   Lab Results  Component Value Date   TSH 1.730 08/09/2021   CBC    Component Value Date/Time   WBC 5.8 05/31/2022 0822   WBC 7.6 09/21/2015 1635   RBC 5.42 (H) 05/31/2022 0822   RBC 4.86 09/21/2015 1635   HGB 15.1 05/31/2022 0822   HCT 46.5 05/31/2022 0822   PLT 228 05/31/2022 0822   MCV 86 05/31/2022 0822   MCH 27.9 05/31/2022 0822   MCH 27.2 09/21/2015 1635   MCHC 32.5 05/31/2022 0822   MCHC 32.3 09/21/2015 1635   RDW 14.1 05/31/2022 0822   Iron Studies No results found for: "IRON", "TIBC", "FERRITIN", "IRONPCTSAT" Lipid Panel     Component Value Date/Time   CHOL 237 (H) 05/31/2022 0822   TRIG 160 (H) 05/31/2022 0822   HDL 49 05/31/2022 0822   CHOLHDL 4.8 (H) 05/31/2022 0822   LDLCALC 159 (H) 05/31/2022 0822   Hepatic Function Panel     Component Value Date/Time   PROT 6.6 05/31/2022 0822   ALBUMIN 4.6 05/31/2022 0822   AST 15 05/31/2022 0822   ALT 20 05/31/2022 0822   ALKPHOS 74 05/31/2022 0822   BILITOT 0.3 05/31/2022 0822      Component Value Date/Time   TSH 1.730 08/09/2021 1012   Nutritional Lab Results  Component Value Date   VD25OH 50.9 05/31/2022   VD25OH 58.8 12/14/2021   VD25OH 26.1 (L) 08/09/2021    Attestations:   Reviewed by clinician on day of visit: allergies, medications, problem list, medical history, surgical history, family history, social history, and previous encounter notes.    I,Safa M Kadhim,acting as a scribe for [employer], DO.,have documented all relevant documentation on the behalf of Megan Lot, DO,as directed by  Megan Lot, DO while in the presence of  Megan Lot, DO.   I, Megan Lot, DO, have reviewed all documentation for this visit. The documentation on 09/18/22 for the exam, diagnosis, procedures, and orders are all accurate and complete.

## 2022-09-19 ENCOUNTER — Ambulatory Visit (HOSPITAL_BASED_OUTPATIENT_CLINIC_OR_DEPARTMENT_OTHER): Payer: Self-pay | Admitting: Obstetrics & Gynecology

## 2022-09-25 ENCOUNTER — Ambulatory Visit (HOSPITAL_BASED_OUTPATIENT_CLINIC_OR_DEPARTMENT_OTHER): Payer: Self-pay | Admitting: Obstetrics & Gynecology

## 2022-09-29 DIAGNOSIS — I10 Essential (primary) hypertension: Secondary | ICD-10-CM | POA: Diagnosis not present

## 2022-09-29 DIAGNOSIS — E782 Mixed hyperlipidemia: Secondary | ICD-10-CM | POA: Diagnosis not present

## 2022-09-29 DIAGNOSIS — D72829 Elevated white blood cell count, unspecified: Secondary | ICD-10-CM | POA: Diagnosis not present

## 2022-09-29 DIAGNOSIS — R5383 Other fatigue: Secondary | ICD-10-CM | POA: Diagnosis not present

## 2022-09-29 DIAGNOSIS — R7303 Prediabetes: Secondary | ICD-10-CM | POA: Diagnosis not present

## 2022-09-29 LAB — BASIC METABOLIC PANEL
BUN: 19 (ref 4–21)
CO2: 23 — AB (ref 13–22)
Chloride: 104 (ref 99–108)
Creatinine: 0.6 (ref 0.5–1.1)
Glucose: 103
Potassium: 3.9 mEq/L (ref 3.5–5.1)
Sodium: 141 (ref 137–147)

## 2022-09-29 LAB — HEMOGLOBIN A1C: Hemoglobin A1C: 5.9

## 2022-09-29 LAB — CBC: RBC: 5.11 (ref 3.87–5.11)

## 2022-09-29 LAB — LIPID PANEL
Cholesterol: 201 — AB (ref 0–200)
HDL: 46 (ref 35–70)
LDL Cholesterol: 127
Triglycerides: 158 (ref 40–160)

## 2022-09-29 LAB — CBC AND DIFFERENTIAL
HCT: 44 (ref 36–46)
Hemoglobin: 14.4 (ref 12.0–16.0)
Neutrophils Absolute: 4.5
Platelets: 249 10*3/uL (ref 150–400)
WBC: 6.7

## 2022-09-29 LAB — COMPREHENSIVE METABOLIC PANEL
Albumin: 2.2 — AB (ref 3.5–5.0)
Calcium: 9.9 (ref 8.7–10.7)
Globulin: 1.9
eGFR: 96

## 2022-09-29 LAB — HEPATIC FUNCTION PANEL
ALT: 19 U/L (ref 7–35)
AST: 15 (ref 13–35)
Alkaline Phosphatase: 87 (ref 25–125)
Bilirubin, Total: 0.2

## 2022-10-06 ENCOUNTER — Other Ambulatory Visit (HOSPITAL_BASED_OUTPATIENT_CLINIC_OR_DEPARTMENT_OTHER): Payer: Self-pay

## 2022-10-06 DIAGNOSIS — I10 Essential (primary) hypertension: Secondary | ICD-10-CM | POA: Diagnosis not present

## 2022-10-06 DIAGNOSIS — K219 Gastro-esophageal reflux disease without esophagitis: Secondary | ICD-10-CM | POA: Diagnosis not present

## 2022-10-06 DIAGNOSIS — J4520 Mild intermittent asthma, uncomplicated: Secondary | ICD-10-CM | POA: Diagnosis not present

## 2022-10-06 DIAGNOSIS — E782 Mixed hyperlipidemia: Secondary | ICD-10-CM | POA: Diagnosis not present

## 2022-10-06 DIAGNOSIS — E119 Type 2 diabetes mellitus without complications: Secondary | ICD-10-CM | POA: Diagnosis not present

## 2022-10-06 DIAGNOSIS — E559 Vitamin D deficiency, unspecified: Secondary | ICD-10-CM | POA: Diagnosis not present

## 2022-10-09 ENCOUNTER — Ambulatory Visit (INDEPENDENT_AMBULATORY_CARE_PROVIDER_SITE_OTHER): Payer: Medicare Other | Admitting: Family Medicine

## 2022-10-09 ENCOUNTER — Encounter (INDEPENDENT_AMBULATORY_CARE_PROVIDER_SITE_OTHER): Payer: Self-pay | Admitting: Family Medicine

## 2022-10-09 VITALS — BP 132/82 | HR 58 | Temp 97.5°F | Ht 59.0 in | Wt 198.0 lb

## 2022-10-09 DIAGNOSIS — K5909 Other constipation: Secondary | ICD-10-CM | POA: Diagnosis not present

## 2022-10-09 DIAGNOSIS — E1159 Type 2 diabetes mellitus with other circulatory complications: Secondary | ICD-10-CM

## 2022-10-09 DIAGNOSIS — E1169 Type 2 diabetes mellitus with other specified complication: Secondary | ICD-10-CM | POA: Diagnosis not present

## 2022-10-09 DIAGNOSIS — I152 Hypertension secondary to endocrine disorders: Secondary | ICD-10-CM | POA: Diagnosis not present

## 2022-10-09 DIAGNOSIS — Z7985 Long-term (current) use of injectable non-insulin antidiabetic drugs: Secondary | ICD-10-CM

## 2022-10-09 DIAGNOSIS — Z6839 Body mass index (BMI) 39.0-39.9, adult: Secondary | ICD-10-CM

## 2022-10-09 DIAGNOSIS — R638 Other symptoms and signs concerning food and fluid intake: Secondary | ICD-10-CM

## 2022-10-09 DIAGNOSIS — Z6841 Body Mass Index (BMI) 40.0 and over, adult: Secondary | ICD-10-CM

## 2022-10-09 MED ORDER — TIRZEPATIDE 2.5 MG/0.5ML ~~LOC~~ SOAJ: 2.5000 mg | SUBCUTANEOUS | 0 refills | Status: DC

## 2022-10-09 MED ORDER — POLYETHYLENE GLYCOL 3350 17 GM/SCOOP PO POWD: 17.0000 g | Freq: Two times a day (BID) | ORAL | 1 refills | Status: DC | PRN

## 2022-10-09 NOTE — Progress Notes (Signed)
[name], D.O.  ABFM, ABOM Specializing in Clinical Bariatric Medicine  Office located at: [address] [place], [place]  [address]     Assessment and Plan:   Medications Discontinued During This Encounter  Medication Reason   tirzepatide [facility]) 2.5 MG/0.5ML Pen Reorder     Meds ordered this encounter  Medications   polyethylene glycol powder (GLYCOLAX/MIRALAX) 17 GM/SCOOP powder    Sig: Take 17 g by mouth 2 (two) times daily as needed. UNTIL REGULAR AND THEN DAILY PRN    Dispense:  3350 g    Refill:  1   tirzepatide (MOUNJARO) 2.5 MG/0.5ML Pen    Sig: Inject 2.5 mg into the skin once a week.    Dispense:  2 mL    Refill:  0     Hypertension associated with diabetes (HCC) Assessment: Condition is stable.  Last 3 blood pressure readings in our office are as follows: BP Readings from Last 3 Encounters:  10/09/22 132/82  09/18/22 124/69  09/12/22 (!) 152/82  - No issues with Norvasc 2.5 mg daily and Hyzaar 100-25 mg daily. Denies any side effects.  - Today she is asymptomatic, no concerns.   Plan: - Continue with all anti-hypertensive medications.  - Continue with Prudent nutritional plan and low sodium diet, advance exercise as tolerated.   - We will continue to monitor symptoms as they relate to the her weight loss journey.     Type 2 diabetes mellitus with obesity (HCC) Other constipation  Assessment: Condition is not optimized.  Lab Results  Component Value Date   HGBA1C 6.5 (H) 05/31/2022   HGBA1C 6.0 (H) 12/14/2021   HGBA1C 6.4 (H) 08/09/2021   INSULIN 23.4 05/31/2022   INSULIN 29.6 (H) 08/09/2021  - She has been compliant with Mounjaro 2.5 mg weekly. Endorses she has been very constipated.  - She has been taking Miralax once daily and endorses that it hasn't been very effective. She also endorses using an equate stool softener.  - Drinks 60 ounces of water daily.  - Reports that her hunger and cravings are controlled.   Plan: -  Increase Miralax to twice daily until constipation is normal and then daily prn. Continue with Mounjaro 2.5 mg weekly.  Will refill Mounjaro today. Discussed that we may increase her Mounjaro dose in the future when her constipation improves.  -  Unless pre-existing renal or cardiopulmonary conditions exist which patient was told to limit their fluid intake by another provider, I recommended roughly one half of their weight in pounds, to be the approximate ounces of non-caloric, non-caffeinated beverages they should drink per day; including more if they are engaging in exercise.  - Eat plenty of fiber (goal is over 25 grams each day).  It is best to get most of your fiber from dietary sources which includes leafy green vegetables, fresh fruit, and whole grains.  You may need to add fiber with the help of OTC fiber supplements.  - Continue her prudent nutritional plan that is low in simple carbohydrates, saturated fats and trans fats to goal of 5-10% weight loss to achieve significant health benefits.  Pt encouraged to continually advance exercise and cardiovascular fitness as tolerated throughout weight loss journey.  - Will continue to monitor symptoms.    Abnormal craving- carbs Assessment: Condition is stable. - Pt endorses that her hunger and cravings are controlled.  - Reports good compliance and tolerance with Topamax 25 mg at lunch and 2 tablets (50 mg) at  dinner  Plan: - Continue with med. Will refill Topamax today.  - Will continue to monitor symptoms.    TREATMENT PLAN FOR OBESITY: BMI 39.97 Morbid obesity (HCC)-start bmi 45.24/date 08/09/21 Assessment:  EATHER POEPPING is here to discuss her progress with her obesity treatment plan along with follow-up of her obesity related diagnoses. See Medical Weight Management Flowsheet for complete bioelectrical impedance results.  Condition is improving, but not optimized. Biometric data collected today, was reviewed with patient.   Since  last office visit on 4/292/4 patient's Fat mass has decreased by 0.8 lb. Muscle mass has decreased by 1.2 lb. Total body water has not changed.  Counseling done on how various foods will affect these numbers and how to maximize success  Total lbs lost to date: 26 Total weight loss percentage to date: 11.61   Plan:  Lisa is currently in the action stage of change. As such, her goal is to continue her weight management plan. Sallee will work on healthier eating habits and continue the Category 2 meal plan with breakfast and lunch options.   Behavioral Intervention Additional resources provided today: patient declined Evidence-based interventions for health behavior change were utilized today including the discussion of self monitoring techniques, problem-solving barriers and SMART goal setting techniques.   Regarding patient's less desirable eating habits and patterns, we employed the technique of small changes.  Pt will specifically work on: continue elliptical 2-3 days a week and add resistance training 2-3 days a week for next visit.    Recommended Physical Activity Goals  Marabella has been advised to slowly work up to 150 minutes of moderate intensity aerobic activity a week and strengthening exercises 2-3 times per week for cardiovascular health, weight loss maintenance and preservation of muscle mass.   She has agreed to Continue current level of physical activity   FOLLOW UP: Return in about 3 weeks (around 10/30/2022). She was informed of the importance of frequent follow up visits to maximize her success with intensive lifestyle modifications for her multiple health conditions.  Subjective:   Chief complaint: Obesity Quinley is here to discuss her progress with her obesity treatment plan. She is on the the Category 2 Plan with breakfast and lunch and states she is following her eating plan approximately 85-90% of the time. She states she is elliptical 45 minutes 2-3 days per  week.  Interval History:  TANDY ZOTO is here for a follow up office visit.     Since last office visit:   - Had labs with her LPN Katie on 1/61/09: LDL was 127, TRIG: 158, HDL: 46, A1c: 5.9, CBC was within normal limits , CMP was grossly normal, Serum Creatinine: 0.61, and her BUN and sodium levels indicated that she was likely dehydrated.  - In addition to the elliptical, she is purposely increasing activity by walking.   - Endorses she has been very busy with taking care of grand kids.   Pharmacotherapy for weight loss: She is currently taking  Mounjaro and Topamax  for medical weight loss.  Denies side effects.    Review of Systems:  Pertinent positives were addressed with patient today.  Weight Summary and Biometrics   Weight Lost Since Last Visit: 2 lb  Weight Gained Since Last Visit: 0    Vitals Temp: (!) 97.5 F (36.4 C) BP: 132/82 Pulse Rate: (!) 58 SpO2: 97 %   Anthropometric Measurements Height: 4\' 11"  (1.499 m) Weight: 198 lb (89.8 kg) BMI (Calculated): 39.97 Weight at Last Visit:  200 lb Weight Lost Since Last Visit: 2 lb Weight Gained Since Last Visit: 0 Starting Weight: 224 lb Total Weight Loss (lbs): 26 lb (11.8 kg) Peak Weight: 234 lb   Body Composition  Body Fat %: 49.4 % Fat Mass (lbs): 98.2 lbs Muscle Mass (lbs): 95.2 lbs Total Body Water (lbs): 76.6 lbs Visceral Fat Rating : 17   Other Clinical Data Fasting: No Labs: No Today's Visit #: 21 Starting Date: 08/09/21   Objective:   PHYSICAL EXAM: Blood pressure 132/82, pulse (!) 58, temperature (!) 97.5 F (36.4 C), height 4\' 11"  (1.499 m), weight 198 lb (89.8 kg), last menstrual period 09/20/2011, SpO2 97 %. Body mass index is 39.99 kg/m.  General: Well Developed, well nourished, and in no acute distress.  HEENT: Normocephalic, atraumatic Skin: Warm and dry, cap RF less 2 sec, good turgor Chest:  Normal excursion, shape, no gross abn Respiratory: speaking in full sentences, no  conversational dyspnea NeuroM-Sk: Ambulates w/o assistance, moves * 4 Psych: A and O *3, insight good, mood-full  DIAGNOSTIC DATA REVIEWED:  BMET    Component Value Date/Time   NA 140 05/31/2022 0822   K 4.0 05/31/2022 0822   CL 103 05/31/2022 0822   CO2 25 05/31/2022 0822   GLUCOSE 101 (H) 05/31/2022 0822   GLUCOSE 103 (H) 09/21/2015 1635   BUN 29 (H) 05/31/2022 0822   CREATININE 0.55 (L) 05/31/2022 0822   CALCIUM 9.8 05/31/2022 0822   GFRNONAA >60 09/21/2015 1635   GFRAA >60 09/21/2015 1635   Lab Results  Component Value Date   HGBA1C 6.5 (H) 05/31/2022   HGBA1C 6.4 (H) 08/09/2021   Lab Results  Component Value Date   INSULIN 23.4 05/31/2022   INSULIN 29.6 (H) 08/09/2021   Lab Results  Component Value Date   TSH 1.730 08/09/2021   CBC    Component Value Date/Time   WBC 5.8 05/31/2022 0822   WBC 7.6 09/21/2015 1635   RBC 5.42 (H) 05/31/2022 0822   RBC 4.86 09/21/2015 1635   HGB 15.1 05/31/2022 0822   HCT 46.5 05/31/2022 0822   PLT 228 05/31/2022 0822   MCV 86 05/31/2022 0822   MCH 27.9 05/31/2022 0822   MCH 27.2 09/21/2015 1635   MCHC 32.5 05/31/2022 0822   MCHC 32.3 09/21/2015 1635   RDW 14.1 05/31/2022 0822   Iron Studies No results found for: "IRON", "TIBC", "FERRITIN", "IRONPCTSAT" Lipid Panel     Component Value Date/Time   CHOL 237 (H) 05/31/2022 0822   TRIG 160 (H) 05/31/2022 0822   HDL 49 05/31/2022 0822   CHOLHDL 4.8 (H) 05/31/2022 0822   LDLCALC 159 (H) 05/31/2022 0822   Hepatic Function Panel     Component Value Date/Time   PROT 6.6 05/31/2022 0822   ALBUMIN 4.6 05/31/2022 0822   AST 15 05/31/2022 0822   ALT 20 05/31/2022 0822   ALKPHOS 74 05/31/2022 0822   BILITOT 0.3 05/31/2022 0822      Component Value Date/Time   TSH 1.730 08/09/2021 1012   Nutritional Lab Results  Component Value Date   VD25OH 50.9 05/31/2022   VD25OH 58.8 12/14/2021   VD25OH 26.1 (L) 08/09/2021    Attestations:   Reviewed by clinician on day of  visit: allergies, medications, problem list, medical history, surgical history, family history, social history, and previous encounter notes.    I,Special Puri,acting as a scribe for [employer], DO.,have documented all relevant documentation on the behalf of Thomasene Lot, DO,as directed by  Thomasene Lot,  DO while in the presence of Thomasene Lot, DO.   I, Thomasene Lot, DO, have reviewed all documentation for this visit. The documentation on 10/09/22 for the exam, diagnosis, procedures, and orders are all accurate and complete.

## 2022-10-30 ENCOUNTER — Ambulatory Visit (INDEPENDENT_AMBULATORY_CARE_PROVIDER_SITE_OTHER): Payer: Medicare Other | Admitting: Family Medicine

## 2022-10-30 ENCOUNTER — Encounter (INDEPENDENT_AMBULATORY_CARE_PROVIDER_SITE_OTHER): Payer: Self-pay | Admitting: Family Medicine

## 2022-10-30 VITALS — BP 122/78 | HR 59 | Temp 97.8°F | Ht 59.0 in | Wt 197.0 lb

## 2022-10-30 DIAGNOSIS — E1169 Type 2 diabetes mellitus with other specified complication: Secondary | ICD-10-CM | POA: Diagnosis not present

## 2022-10-30 DIAGNOSIS — Z7985 Long-term (current) use of injectable non-insulin antidiabetic drugs: Secondary | ICD-10-CM | POA: Diagnosis not present

## 2022-10-30 DIAGNOSIS — E669 Obesity, unspecified: Secondary | ICD-10-CM

## 2022-10-30 DIAGNOSIS — Z6841 Body Mass Index (BMI) 40.0 and over, adult: Secondary | ICD-10-CM

## 2022-10-30 DIAGNOSIS — K5909 Other constipation: Secondary | ICD-10-CM | POA: Diagnosis not present

## 2022-10-30 DIAGNOSIS — Z6839 Body mass index (BMI) 39.0-39.9, adult: Secondary | ICD-10-CM

## 2022-10-30 DIAGNOSIS — R638 Other symptoms and signs concerning food and fluid intake: Secondary | ICD-10-CM

## 2022-10-30 MED ORDER — TIRZEPATIDE 2.5 MG/0.5ML ~~LOC~~ SOAJ
2.5000 mg | SUBCUTANEOUS | 0 refills | Status: DC
Start: 2022-10-30 — End: 2022-11-16

## 2022-10-30 MED ORDER — TOPIRAMATE 50 MG PO TABS: ORAL_TABLET | ORAL | 0 refills | Status: DC

## 2022-10-30 NOTE — Progress Notes (Signed)
Megan [name], D.O.  ABFM, ABOM Specializing in Clinical Bariatric Medicine  Office located at: [address] [place], [place]  [address]     Assessment and Plan:   Medications Discontinued During This Encounter  Medication Reason   tirzepatide [facility]) 2.5 MG/0.5ML Pen    topiramate (TOPAMAX) 25 MG tablet Reorder     Meds ordered this encounter  Medications   topiramate (TOPAMAX) 50 MG tablet    Sig: 1 po BID    Dispense:  60 tablet    Refill:  0   tirzepatide (MOUNJARO) 2.5 MG/0.5ML Pen    Sig: Inject 2.5 mg into the skin once a week.    Dispense:  2 mL    Refill:  0     Abnormal craving- carbs Assessment: Condition is improving, but not optimized. Patient endorses having more hunger and cravings since last OV. On her own, Megan [name] increased her Topamax to 50 mg at lunch and 50 mg at dinner and is tolerating this well. Prior to increasing the dose, Megan [name] was taking 25 mg at lunch and 50 mg at dinner.   Plan:Increase Topamax to 50 mg BID. I again reminded patient that having protein with each meal is crucial for controlling hunger and cravings. Continue her prudent nutritional plan that is low in simple carbohydrates, saturated fats and trans fats to goal of 5-10% weight loss to achieve significant health benefits. Will continue to monitor condition closely.    Type 2 diabetes mellitus with obesity (HCC) Other constipation Assessment: Condition is improving, but not optimized. She has been compliant and tolerant with Mounjaro 2.5 mg weekly. Her consitpation side effects from the [facility] have improved significantly since increasing Miralax to twice daily and increasing her water intake. No concerns.   Lab Results  Component Value Date   HGBA1C 5.9 09/29/2022   HGBA1C 6.5 (H) 05/31/2022   HGBA1C 6.0 (H) 12/14/2021   INSULIN 23.4 05/31/2022   INSULIN 29.6 (H) 08/09/2021    Plan: Continue with antidiabetic medication.Continue with decreasing simple carbs and  increasing lean protein intake. Will continue to monitor condition closely alongside PCP.    TREATMENT PLAN FOR OBESITY: BMI 39.97 Morbid obesity (HCC)-start bmi 45.24/date 08/09/21 Assessment:  Megan [name] is here to discuss her progress with her obesity treatment plan along with follow-up of her obesity related diagnoses. See Medical Weight Management Flowsheet for complete bioelectrical impedance results.  Condition is improving. Biometric data collected today, was reviewed with patient.   Since last office visit on 10/09/22 patient's  Muscle mass has increased by 4 lb. Fat mass has decreased by 5.4 lb. Total body water has decreased by 1.4 lb.  Counseling done on how various foods will affect these numbers and how to maximize success  Total lbs lost to date: 27 Total weight loss percentage to date: 12.05   Plan:  - Continue the Category 2 meal plan with breakfast and lunch options.   Behavioral Intervention Additional resources provided today: patient declined Evidence-based interventions for health behavior change were utilized today including the discussion of self monitoring techniques, problem-solving barriers and SMART goal setting techniques.   Regarding patient's less desirable eating habits and patterns, we employed the technique of small changes.  Pt will specifically work on: continue following meal plan closely for next visit.    Recommended Physical Activity Goals  Megan [name] has been advised to slowly work up to 150 minutes of moderate intensity aerobic activity a week and strengthening exercises 2-3 times per  week for cardiovascular health, weight loss maintenance and preservation of muscle mass.   She has agreed to Continue current level of physical activity   FOLLOW UP: Return in about 17 days (around 11/16/2022). She was informed of the importance of frequent follow up visits to maximize her success with intensive lifestyle modifications for her multiple health  conditions.   Subjective:   Chief complaint: Obesity Megan [name] is here to discuss her progress with her obesity treatment plan. She is on the the Category 2 Plan with breakfast and lunch options and states she is following her eating plan approximately 85% of the time. She states she is doing New Step 45 minutes 2 days per week.  Interval History:  Megan [name] is here for a follow up office visit.     Since last office visit:    - Apart from her regular exercise, pt has also been golfing.   - Her constipation symptoms have improved.  - She has been having more hunger and cravings.  - Pt has been focusing on her protein intake.   Pharmacotherapy for weight loss: She is currently taking Mounjaro and Topamax for medical weight loss.  Denies side effects.    Review of Systems:  Pertinent positives were addressed with patient today.  Weight Summary and Biometrics   Weight Lost Since Last Visit: 1lb  No data recorded   Vitals Temp: 97.8 F (36.6 C) BP: 122/78 Pulse Rate: (!) 59 SpO2: 95 %   Anthropometric Measurements Height: 4\' 11"  (1.499 m) Weight: 197 lb (89.4 kg) BMI (Calculated): 39.77 Weight at Last Visit: 198lb Weight Lost Since Last Visit: 1lb Starting Weight: 224lb Total Weight Loss (lbs): 27 lb (12.2 kg) Peak Weight: 234lb   Body Composition  Body Fat %: 47 % Fat Mass (lbs): 92.8 lbs Muscle Mass (lbs): 99.2 lbs Total Body Water (lbs): 75.2 lbs Visceral Fat Rating : 16   Other Clinical Data Fasting: no Labs: no Today's Visit #: 22 Starting Date: 08/09/00   Objective:   PHYSICAL EXAM: Blood pressure 122/78, pulse (!) 59, temperature 97.8 F (36.6 C), height 4\' 11"  (1.499 m), weight 197 lb (89.4 kg), last menstrual period 09/20/2011, SpO2 95 %. Body mass index is 39.79 kg/m.  General: Well Developed, well nourished, and in no acute distress.  HEENT: Normocephalic, atraumatic Skin: Warm and dry, cap RF less 2 sec, good turgor Chest:   Normal excursion, shape, no gross abn Respiratory: speaking in full sentences, no conversational dyspnea NeuroM-Sk: Ambulates w/o assistance, moves * 4 Psych: A and O *3, insight good, mood-full  DIAGNOSTIC DATA REVIEWED:  BMET    Component Value Date/Time   NA 141 09/29/2022 0000   K 3.9 09/29/2022 0000   CL 104 09/29/2022 0000   CO2 23 (A) 09/29/2022 0000   GLUCOSE 101 (H) 05/31/2022 0822   GLUCOSE 103 (H) 09/21/2015 1635   BUN 19 09/29/2022 0000   CREATININE 0.6 09/29/2022 0000   CREATININE 0.55 (L) 05/31/2022 0822   CALCIUM 9.9 09/29/2022 0000   GFRNONAA >60 09/21/2015 1635   GFRAA >60 09/21/2015 1635   Lab Results  Component Value Date   HGBA1C 5.9 09/29/2022   HGBA1C 6.4 (H) 08/09/2021   Lab Results  Component Value Date   INSULIN 23.4 05/31/2022   INSULIN 29.6 (H) 08/09/2021   Lab Results  Component Value Date   TSH 1.730 08/09/2021   CBC    Component Value Date/Time   WBC 6.7 09/29/2022 0000   WBC 7.6 09/21/2015  1635   RBC 5.11 09/29/2022 0000   HGB 14.4 09/29/2022 0000   HGB 15.1 05/31/2022 0822   HCT 44 09/29/2022 0000   HCT 46.5 05/31/2022 0822   PLT 249 09/29/2022 0000   PLT 228 05/31/2022 0822   MCV 86 05/31/2022 0822   MCH 27.9 05/31/2022 0822   MCH 27.2 09/21/2015 1635   MCHC 32.5 05/31/2022 0822   MCHC 32.3 09/21/2015 1635   RDW 14.1 05/31/2022 0822   Iron Studies No results found for: "IRON", "TIBC", "FERRITIN", "IRONPCTSAT" Lipid Panel     Component Value Date/Time   CHOL 201 (A) 09/29/2022 0000   CHOL 237 (H) 05/31/2022 0822   TRIG 158 09/29/2022 0000   HDL 46 09/29/2022 0000   HDL 49 05/31/2022 0822   CHOLHDL 4.8 (H) 05/31/2022 0822   LDLCALC 127 09/29/2022 0000   LDLCALC 159 (H) 05/31/2022 0822   Hepatic Function Panel     Component Value Date/Time   PROT 6.6 05/31/2022 0822   ALBUMIN 2.2 (A) 09/29/2022 0000   ALBUMIN 4.6 05/31/2022 0822   AST 15 09/29/2022 0000   ALT 19 09/29/2022 0000   ALKPHOS 87 09/29/2022 0000    BILITOT 0.3 05/31/2022 0822      Component Value Date/Time   TSH 1.730 08/09/2021 1012   Nutritional Lab Results  Component Value Date   VD25OH 50.9 05/31/2022   VD25OH 58.8 12/14/2021   VD25OH 26.1 (L) 08/09/2021    Attestations:   Reviewed by clinician on day of visit: allergies, medications, problem list, medical history, surgical history, family history, social history, and previous encounter notes.   I, Special Randolm Idol, acting as a Stage manager for [employer], DO., have compiled all relevant documentation for today's office visit on behalf of Thomasene Lot, DO, while in the presence of [employer], DO.   I have reviewed the above documentation for accuracy and completeness, and I agree with the above. Megan [name], D.O.   The 21st Century Cures Act was signed into law in 2016 which includes the topic of electronic health records.  This provides immediate access to information in MyChart.  This includes consultation notes, operative notes, office notes, lab results and pathology reports.  If you have any questions about what you read please let us know at your next visit so we can discuss your concerns and take corrective action if need be.  We are right here with you.

## 2022-11-01 DIAGNOSIS — Z1231 Encounter for screening mammogram for malignant neoplasm of breast: Secondary | ICD-10-CM | POA: Diagnosis not present

## 2022-11-05 ENCOUNTER — Other Ambulatory Visit (INDEPENDENT_AMBULATORY_CARE_PROVIDER_SITE_OTHER): Payer: Self-pay | Admitting: Family Medicine

## 2022-11-05 DIAGNOSIS — R638 Other symptoms and signs concerning food and fluid intake: Secondary | ICD-10-CM

## 2022-11-06 DIAGNOSIS — R5383 Other fatigue: Secondary | ICD-10-CM | POA: Diagnosis not present

## 2022-11-06 DIAGNOSIS — R051 Acute cough: Secondary | ICD-10-CM | POA: Diagnosis not present

## 2022-11-06 DIAGNOSIS — M79601 Pain in right arm: Secondary | ICD-10-CM | POA: Diagnosis not present

## 2022-11-08 DIAGNOSIS — R922 Inconclusive mammogram: Secondary | ICD-10-CM | POA: Diagnosis not present

## 2022-11-14 ENCOUNTER — Ambulatory Visit (HOSPITAL_BASED_OUTPATIENT_CLINIC_OR_DEPARTMENT_OTHER)
Admission: RE | Admit: 2022-11-14 | Discharge: 2022-11-14 | Disposition: A | Discharge status: Home / Self Care | Payer: Medicare Other | Source: Ambulatory Visit

## 2022-11-14 DIAGNOSIS — E782 Mixed hyperlipidemia: Secondary | ICD-10-CM | POA: Insufficient documentation

## 2022-11-16 ENCOUNTER — Ambulatory Visit (INDEPENDENT_AMBULATORY_CARE_PROVIDER_SITE_OTHER): Payer: Medicare Other | Admitting: Family Medicine

## 2022-11-16 ENCOUNTER — Encounter (INDEPENDENT_AMBULATORY_CARE_PROVIDER_SITE_OTHER): Payer: Self-pay | Admitting: Family Medicine

## 2022-11-16 VITALS — BP 120/78 | HR 50 | Temp 97.5°F | Ht 59.0 in | Wt 195.0 lb

## 2022-11-16 DIAGNOSIS — R638 Other symptoms and signs concerning food and fluid intake: Secondary | ICD-10-CM

## 2022-11-16 DIAGNOSIS — E1169 Type 2 diabetes mellitus with other specified complication: Secondary | ICD-10-CM

## 2022-11-16 DIAGNOSIS — Z6839 Body mass index (BMI) 39.0-39.9, adult: Secondary | ICD-10-CM

## 2022-11-16 DIAGNOSIS — Z6841 Body Mass Index (BMI) 40.0 and over, adult: Secondary | ICD-10-CM

## 2022-11-16 DIAGNOSIS — Z7985 Long-term (current) use of injectable non-insulin antidiabetic drugs: Secondary | ICD-10-CM | POA: Diagnosis not present

## 2022-11-16 MED ORDER — TOPIRAMATE 50 MG PO TABS
ORAL_TABLET | ORAL | 0 refills | Status: DC
Start: 2022-11-16 — End: 2022-12-28

## 2022-11-16 MED ORDER — TIRZEPATIDE 5 MG/0.5ML ~~LOC~~ SOAJ
5.0000 mg | SUBCUTANEOUS | 0 refills | Status: DC
Start: 2022-11-16 — End: 2022-12-04

## 2022-11-16 NOTE — Progress Notes (Signed)
Megan [name], D.O.  ABFM, ABOM Specializing in Clinical Bariatric Medicine  Office located at: [address] [place], [place]  [address]     Assessment and Plan:   Medications Discontinued During This Encounter  Medication Reason   topiramate (TOPAMAX) 25 MG tablet    tirzepatide (MOUNJARO) 2.5 MG/0.5ML Pen    topiramate (TOPAMAX) 50 MG tablet Reorder     Meds ordered this encounter  Medications   topiramate (TOPAMAX) 50 MG tablet    Sig: 1 po BID    Dispense:  60 tablet    Refill:  0   tirzepatide (MOUNJARO) 5 MG/0.5ML Pen    Sig: Inject 5 mg into the skin once a week.    Dispense:  2 mL    Refill:  0     Type 2 diabetes mellitus with obesity (HCC) Assessment: Condition is improving, but not optimized. Her diabetes mellitus is being treated with Mounjaro 2.5 mg once weekly. She is tolerating medication well and denies any GI upset or constipation. She endorses still having some cravings carbohydrates.     Lab Results  Component Value Date   HGBA1C 5.9 09/29/2022   HGBA1C 6.5 (H) 05/31/2022   HGBA1C 6.0 (H) 12/14/2021   INSULIN 23.4 05/31/2022   INSULIN 29.6 (H) 08/09/2021    Plan: Increased Mounjaro to 5 mg once weekly. Discussed anticipated benefits, possible risks of increasing her Mounjaro dose. Questions were encouraged and all of them were answered appropriately.   Continue her prudent nutritional plan that is low in simple carbohydrates, saturated fats and trans fats to goal of 5-10% weight loss to achieve significant health benefits.  Pt encouraged to continually advance exercise and cardiovascular fitness as tolerated throughout weight loss journey. Recheck labs in 3 months or as deemed appropriate if not done at Endo provider / PCP.    Abnormal craving- carbs Assessment: Condition is improving. Pt is tolerating Topamax 50 mg BID well and denies any adverse effects. Her carb cravings are still existent, but have improved from prior.   Plan: Pt  advised to maintain with Topamax at current dose. Will refill today.    Continue her prudent nutritional plan that is low in simple carbohydrates, saturated fats and trans fats to goal of 5-10% weight loss to achieve significant health benefits.  Pt encouraged to continually advance exercise and cardiovascular fitness as tolerated throughout weight loss journey. Will continue to monitor condition closely.    TREATMENT PLAN FOR OBESITY: BMI 45.0-49.9, adult (HCC)- current 39.36 Class 3 severe obesity with serious comorbidity and body mass index (BMI) of 45.0 to 49.9 in adult, unspecified obesity type [facility]) Assessment: Megan [name] is here to discuss her progress with her obesity treatment plan along with follow-up of her obesity related diagnoses. See Medical Weight Management Flowsheet for complete bioelectrical impedance results.  Condition is not optimized.  Biometric data collected today, was reviewed with patient.   Since last office visit on 10/30/22 patient's  Muscle mass has decreased by 2 lb. Fat mass has increased by 0.2 lb. Total body water has increased by 0.4 lb.  Counseling done on how various foods will affect these numbers and how to maximize success  Total lbs lost to date: 29  Total weight loss percentage to date: 12.95   Plan: Continue the Category 2 meal plan with breakfast and lunch options.    - We may consider having her journal her intake to account for potential excess calories next OV.  Behavioral  Intervention Additional resources provided today: patient declined Evidence-based interventions for health behavior change were utilized today including the discussion of self monitoring techniques, problem-solving barriers and SMART goal setting techniques.   Regarding patient's less desirable eating habits and patterns, we employed the technique of small changes.  Pt will specifically work on: measuring her lean protein intake and continue with exercise regimen for  next visit.    Recommended Physical Activity Goals  Megan [name] has been advised to slowly work up to 150 minutes of moderate intensity aerobic activity a week and strengthening exercises 2-3 times per week for cardiovascular health, weight loss maintenance and preservation of muscle mass.   She has agreed to Continue current level of physical activity   FOLLOW UP: Return in about 3 weeks (around 12/07/2022). She was informed of the importance of frequent follow up visits to maximize her success with intensive lifestyle modifications for her multiple health conditions.  Subjective:   Chief complaint: Obesity [name] is here to discuss her progress with her obesity treatment plan. She is on the Category 2 Plan with breakfast and lunch options and states she is following her eating plan approximately 85% of the time. She states she is walking 30 minutes 7 days per week.  Interval History:  Megan [name] is here for a follow up office visit.  Pt has been taking Prednisone for the last 12 days for orthopedic issues with arm and endorses having symptoms of stomach pain and loose stools. I advised pt to follow up with PCP/specialists if her symptoms do not improve. She endorses making mostly healthy choices, however has not been measuring her lean proteins on a consistent basis. She is excited about an upcoming retreat.   Pharmacotherapy for weight loss: She is currently taking Topamax and Mounjaro  for medical weight loss.  Denies side effects.    Review of Systems:  Pertinent positives were addressed with patient today.  Reviewed by clinician on day of visit: allergies, medications, problem list, medical history, surgical history, family history, social history, and previous encounter notes.  Weight Summary and Biometrics   Weight Lost Since Last Visit: 2lb  Weight Gained Since Last Visit: 0   Vitals Temp: (!) 97.5 F (36.4 C) BP: 120/78 Pulse Rate: (!) 50 SpO2: 97 %   Anthropometric  Measurements Height: 4\' 11"  (1.499 m) Weight: 195 lb (88.5 kg) BMI (Calculated): 39.36 Weight at Last Visit: 197lb Weight Lost Since Last Visit: 2lb Weight Gained Since Last Visit: 0 Starting Weight: 224lb Total Weight Loss (lbs): 29 lb (13.2 kg) Peak Weight: 234lb   Body Composition  Body Fat %: 47.6 % Fat Mass (lbs): 93 lbs Muscle Mass (lbs): 97.2 lbs Total Body Water (lbs): 75.6 lbs Visceral Fat Rating : 16   Other Clinical Data Fasting: no Labs: no Today's Visit #: 23 Starting Date: 08/09/20   Objective:   PHYSICAL EXAM: Blood pressure 120/78, pulse (!) 50, temperature (!) 97.5 F (36.4 C), height 4\' 11"  (1.499 m), weight 195 lb (88.5 kg), last menstrual period 09/20/2011, SpO2 97 %. Body mass index is 39.39 kg/m.  General: Well Developed, well nourished, and in no acute distress.  HEENT: Normocephalic, atraumatic Skin: Warm and dry, cap RF less 2 sec, good turgor Chest:  Normal excursion, shape, no gross abn Respiratory: speaking in full sentences, no conversational dyspnea NeuroM-Sk: Ambulates w/o assistance, moves * 4 Psych: A and O *3, insight good, mood-full  DIAGNOSTIC DATA REVIEWED:  BMET    Component Value Date/Time  NA 141 09/29/2022 0000   K 3.9 09/29/2022 0000   CL 104 09/29/2022 0000   CO2 23 (A) 09/29/2022 0000   GLUCOSE 101 (H) 05/31/2022 0822   GLUCOSE 103 (H) 09/21/2015 1635   BUN 19 09/29/2022 0000   CREATININE 0.6 09/29/2022 0000   CREATININE 0.55 (L) 05/31/2022 0822   CALCIUM 9.9 09/29/2022 0000   GFRNONAA >60 09/21/2015 1635   GFRAA >60 09/21/2015 1635   Lab Results  Component Value Date   HGBA1C 5.9 09/29/2022   HGBA1C 6.4 (H) 08/09/2021   Lab Results  Component Value Date   INSULIN 23.4 05/31/2022   INSULIN 29.6 (H) 08/09/2021   Lab Results  Component Value Date   TSH 1.730 08/09/2021   CBC    Component Value Date/Time   WBC 6.7 09/29/2022 0000   WBC 7.6 09/21/2015 1635   RBC 5.11 09/29/2022 0000   HGB 14.4  09/29/2022 0000   HGB 15.1 05/31/2022 0822   HCT 44 09/29/2022 0000   HCT 46.5 05/31/2022 0822   PLT 249 09/29/2022 0000   PLT 228 05/31/2022 0822   MCV 86 05/31/2022 0822   MCH 27.9 05/31/2022 0822   MCH 27.2 09/21/2015 1635   MCHC 32.5 05/31/2022 0822   MCHC 32.3 09/21/2015 1635   RDW 14.1 05/31/2022 0822   Iron Studies No results found for: "IRON", "TIBC", "FERRITIN", "IRONPCTSAT" Lipid Panel     Component Value Date/Time   CHOL 201 (A) 09/29/2022 0000   CHOL 237 (H) 05/31/2022 0822   TRIG 158 09/29/2022 0000   HDL 46 09/29/2022 0000   HDL 49 05/31/2022 0822   CHOLHDL 4.8 (H) 05/31/2022 0822   LDLCALC 127 09/29/2022 0000   LDLCALC 159 (H) 05/31/2022 0822   Hepatic Function Panel     Component Value Date/Time   PROT 6.6 05/31/2022 0822   ALBUMIN 2.2 (A) 09/29/2022 0000   ALBUMIN 4.6 05/31/2022 0822   AST 15 09/29/2022 0000   ALT 19 09/29/2022 0000   ALKPHOS 87 09/29/2022 0000   BILITOT 0.3 05/31/2022 0822      Component Value Date/Time   TSH 1.730 08/09/2021 1012   Nutritional Lab Results  Component Value Date   VD25OH 50.9 05/31/2022   VD25OH 58.8 12/14/2021   VD25OH 26.1 (L) 08/09/2021    Attestations:   I, Special Puri, acting as a Stage manager for Thomasene Lot, DO., have compiled all relevant documentation for today's office visit on behalf of Thomasene Lot, DO, while in the presence of [employer], DO.  I have reviewed the above documentation for accuracy and completeness, and I agree with the above. Megan [name], D.O.  The 21st Century Cures Act was signed into law in 2016 which includes the topic of electronic health records.  This provides immediate access to information in MyChart.  This includes consultation notes, operative notes, office notes, lab results and pathology reports.  If you have any questions about what you read please let us know at your next visit so we can discuss your concerns and take corrective action if need be.   We are right here with you.

## 2022-11-17 ENCOUNTER — Other Ambulatory Visit (INDEPENDENT_AMBULATORY_CARE_PROVIDER_SITE_OTHER): Payer: Self-pay | Admitting: Family Medicine

## 2022-11-17 DIAGNOSIS — E1169 Type 2 diabetes mellitus with other specified complication: Secondary | ICD-10-CM

## 2022-11-22 ENCOUNTER — Encounter (HOSPITAL_BASED_OUTPATIENT_CLINIC_OR_DEPARTMENT_OTHER): Payer: Self-pay | Admitting: *Deleted

## 2022-11-29 DIAGNOSIS — R194 Change in bowel habit: Secondary | ICD-10-CM | POA: Diagnosis not present

## 2022-11-29 DIAGNOSIS — R197 Diarrhea, unspecified: Secondary | ICD-10-CM | POA: Diagnosis not present

## 2022-11-29 DIAGNOSIS — R1032 Left lower quadrant pain: Secondary | ICD-10-CM | POA: Diagnosis not present

## 2022-12-04 ENCOUNTER — Encounter (INDEPENDENT_AMBULATORY_CARE_PROVIDER_SITE_OTHER): Payer: Self-pay | Admitting: Family Medicine

## 2022-12-04 ENCOUNTER — Ambulatory Visit (INDEPENDENT_AMBULATORY_CARE_PROVIDER_SITE_OTHER): Payer: Medicare Other | Admitting: Family Medicine

## 2022-12-04 VITALS — BP 126/70 | HR 57 | Temp 98.4°F | Ht 59.0 in | Wt 195.0 lb

## 2022-12-04 DIAGNOSIS — Z6839 Body mass index (BMI) 39.0-39.9, adult: Secondary | ICD-10-CM

## 2022-12-04 DIAGNOSIS — R638 Other symptoms and signs concerning food and fluid intake: Secondary | ICD-10-CM | POA: Diagnosis not present

## 2022-12-04 DIAGNOSIS — Z7985 Long-term (current) use of injectable non-insulin antidiabetic drugs: Secondary | ICD-10-CM

## 2022-12-04 DIAGNOSIS — K5909 Other constipation: Secondary | ICD-10-CM | POA: Diagnosis not present

## 2022-12-04 DIAGNOSIS — E1169 Type 2 diabetes mellitus with other specified complication: Secondary | ICD-10-CM | POA: Diagnosis not present

## 2022-12-04 DIAGNOSIS — Z6841 Body Mass Index (BMI) 40.0 and over, adult: Secondary | ICD-10-CM

## 2022-12-04 MED ORDER — TIRZEPATIDE 5 MG/0.5ML ~~LOC~~ SOAJ: 5.0000 mg | SUBCUTANEOUS | 0 refills | Status: DC

## 2022-12-04 NOTE — Progress Notes (Signed)
Megan [name], D.O.  ABFM, ABOM Specializing in Clinical Bariatric Medicine  Office located at: [address] [place], [place]  [address]     Assessment and Plan:   Medications Discontinued During This Encounter  Medication Reason   tirzepatide [facility]) 5 MG/0.5ML Pen Reorder     Meds ordered this encounter  Medications   tirzepatide (MOUNJARO) 5 MG/0.5ML Pen    Sig: Inject 5 mg into the skin once a week.    Dispense:  2 mL    Refill:  0     Type 2 diabetes mellitus with obesity (HCC) Assessment: Condition is Not optimized. She continues Mounjaro 2.5mg  once weekly and she denies any side effect. She does not check her blood sugar at home. Her PCP does not check her A1c and insulin as she has left that job to me.  Lab Results  Component Value Date   HGBA1C 5.9 09/29/2022   HGBA1C 6.5 (H) 05/31/2022   HGBA1C 6.0 (H) 12/14/2021   INSULIN 23.4 05/31/2022   INSULIN 29.6 (H) 08/09/2021    Plan: I stressed the importance of continuing Mounjaro consistently and not skipping meals. Will refill today. She is too check her blood sugar at home with a generic glucometer, provided by me. Unless pre-existing renal or cardiopulmonary conditions exist which patient was told to limit their fluid intake by another provider, I recommended roughly one half of their weight in pounds, to be the approximate ounces of non-caloric, non-caffeinated beverages they should drink per day; including more if they are engaging in exercise. Reminded BRYNNE DOANE if she feels poorly- check Blood Sugar and Blood Pressure at that time. Hypoglycemia prevention discussed with the patient.  Eat on a regular basis- no skipping or going long periods without eating. Recheck labs in 3 months if not done at Endo provider / PCP.    Abnormal craving- carbs Assessment: Condition is stable. Cravings and hunger are well controlled. She has continued to avoid beef/pork and ate mainly seafood on her cruise. She is  tolerating Topamax 50mg  BID well and denies any side effects.  Plan: Continue Topamax at current dose as directed by Dr. Val Eagle. Continue her prudent nutritional plan that is low in simple carbohydrates, saturated fats and trans fats to goal of 5-10% weight loss to achieve significant health benefits. Reminded patient of the importance of following their prudent nutrition plan and how food can affect mood as well to support emotional wellbeing. We will continue to monitor closely alongside PCP / other specialists.   Other constipation Assessment: Condition is improved. She takes MiraLax twice daily. She knows that the more her protein intake increases the higher of a risk constipation is.  Plan: Continue to monitor symptoms and increase fiber in her diet to avoid constipation.    TREATMENT PLAN FOR OBESITY: BMI 45.0-49.9, adult (HCC)- current 39.36 Class 3 severe obesity with serious comorbidity and body mass index (BMI) of 45.0 to 49.9 in adult, unspecified obesity type [facility]) Assessment:  [name] GAFFIN is here to discuss her progress with her obesity treatment plan along with follow-up of her obesity related diagnoses. See Medical Weight Management Flowsheet for complete bioelectrical impedance results.  Condition is docourse: improving. Biometric data collected today, was reviewed with patient.   Since last office visit on 11/16/2022 patient's  Muscle mass has increased by 0.5lb. Fat mass has decreased by 1.2lb. Total body water has decreased by 0.6lb.  Counseling done on how various foods will affect these numbers  and how to maximize success  Total lbs lost to date: 29 Total weight loss percentage to date: 12.95   Plan: Continue following the prudent Category 2 meal plan with breakfast and lunch options.    Behavioral Intervention Additional resources provided today: patient declined Evidence-based interventions for health behavior change were utilized today including the discussion of  self monitoring techniques, problem-solving barriers and SMART goal setting techniques.   Regarding patient's less desirable eating habits and patterns, we employed the technique of small changes.  Pt will specifically work on: Continue to exercise and hit 95oz of water a day for next visit.    Recommended Physical Activity Goals Jadis has been advised to slowly work up to 150 minutes of moderate intensity aerobic activity a week and strengthening exercises 2-3 times per week for cardiovascular health, weight loss maintenance and preservation of muscle mass.   She has agreed to Continue current level of physical activity   FOLLOW UP: Return in about 24 days (around 12/28/2022). She was informed of the importance of frequent follow up visits to maximize her success with intensive lifestyle modifications for her multiple health conditions.  Subjective:   Chief complaint: Obesity Del is here to discuss her progress with her obesity treatment plan. She is on the the Category 2 Plan with B and L options and states she is following her eating plan approximately 85% of the time. She states she is walking 30 minutes 2 days per week.  Interval History:  MAUI AHART is here for a follow up office visit. Since last OV she has now come back from a cruise. She has sticked to her meal plan, where she ate mostly sea food on her trip and did not eat any pork. She did express to me that she wasn't sure what all the food was nutritional wise that was served and it wasn't satisfying. She did walk the ship as an alternative for cardio. Her constipation has improved and she takes MiraLAX about twice daily. She continues to drink half of her weight in water. Her GI doctor informed her to continue her medication as everything prescribed is "keeping her alive" and it was like a realization to her to continue her healthy weight loss journey.   Pharmacotherapy for weight loss: She is currently taking Topamax and  Mounjaro  for medical weight loss.  Denies side effects.    Review of Systems:  Pertinent positives were addressed with patient today.  Reviewed by clinician on day of visit: allergies, medications, problem list, medical history, surgical history, family history, social history, and previous encounter notes.  Weight Summary and Biometrics   Weight Lost Since Last Visit: 0lb  Weight Gained Since Last Visit: 0   Vitals Temp: 98.4 F (36.9 C) BP: 126/70 Pulse Rate: (!) 57 SpO2: 93 %   Anthropometric Measurements Height: 4\' 11"  (1.499 m) Weight: 195 lb (88.5 kg) BMI (Calculated): 39.36 Weight at Last Visit: 195lb Weight Lost Since Last Visit: 0lb Weight Gained Since Last Visit: 0 Starting Weight: 224lb Total Weight Loss (lbs): 29 lb (13.2 kg) Peak Weight: 234lb   Body Composition  Body Fat %: 47.1 % Fat Mass (lbs): 91.8 lbs Muscle Mass (lbs): 98 lbs Total Body Water (lbs): 75 lbs Visceral Fat Rating : 16   Other Clinical Data Fasting: no Labs: no Today's Visit #: 24 Starting Date: 08/09/20     Objective:   PHYSICAL EXAM: Blood pressure 126/70, pulse (!) 57, temperature 98.4 F (36.9 C), height 4'  11" (1.499 m), weight 195 lb (88.5 kg), last menstrual period 09/20/2011, SpO2 93%. Body mass index is 39.39 kg/m.  General: Well Developed, well nourished, and in no acute distress.  HEENT: Normocephalic, atraumatic Skin: Warm and dry, cap RF less 2 sec, good turgor Chest:  Normal excursion, shape, no gross abn Respiratory: speaking in full sentences, no conversational dyspnea NeuroM-Sk: Ambulates w/o assistance, moves * 4 Psych: A and O *3, insight good, mood-full  DIAGNOSTIC DATA REVIEWED:  BMET    Component Value Date/Time   NA 141 09/29/2022 0000   K 3.9 09/29/2022 0000   CL 104 09/29/2022 0000   CO2 23 (A) 09/29/2022 0000   GLUCOSE 101 (H) 05/31/2022 0822   GLUCOSE 103 (H) 09/21/2015 1635   BUN 19 09/29/2022 0000   CREATININE 0.6 09/29/2022 0000    CREATININE 0.55 (L) 05/31/2022 0822   CALCIUM 9.9 09/29/2022 0000   GFRNONAA >60 09/21/2015 1635   GFRAA >60 09/21/2015 1635   Lab Results  Component Value Date   HGBA1C 5.9 09/29/2022   HGBA1C 6.4 (H) 08/09/2021   Lab Results  Component Value Date   INSULIN 23.4 05/31/2022   INSULIN 29.6 (H) 08/09/2021   Lab Results  Component Value Date   TSH 1.730 08/09/2021   CBC    Component Value Date/Time   WBC 6.7 09/29/2022 0000   WBC 7.6 09/21/2015 1635   RBC 5.11 09/29/2022 0000   HGB 14.4 09/29/2022 0000   HGB 15.1 05/31/2022 0822   HCT 44 09/29/2022 0000   HCT 46.5 05/31/2022 0822   PLT 249 09/29/2022 0000   PLT 228 05/31/2022 0822   MCV 86 05/31/2022 0822   MCH 27.9 05/31/2022 0822   MCH 27.2 09/21/2015 1635   MCHC 32.5 05/31/2022 0822   MCHC 32.3 09/21/2015 1635   RDW 14.1 05/31/2022 0822   Iron Studies No results found for: "IRON", "TIBC", "FERRITIN", "IRONPCTSAT" Lipid Panel     Component Value Date/Time   CHOL 201 (A) 09/29/2022 0000   CHOL 237 (H) 05/31/2022 0822   TRIG 158 09/29/2022 0000   HDL 46 09/29/2022 0000   HDL 49 05/31/2022 0822   CHOLHDL 4.8 (H) 05/31/2022 0822   LDLCALC 127 09/29/2022 0000   LDLCALC 159 (H) 05/31/2022 0822   Hepatic Function Panel     Component Value Date/Time   PROT 6.6 05/31/2022 0822   ALBUMIN 2.2 (A) 09/29/2022 0000   ALBUMIN 4.6 05/31/2022 0822   AST 15 09/29/2022 0000   ALT 19 09/29/2022 0000   ALKPHOS 87 09/29/2022 0000   BILITOT 0.3 05/31/2022 0822      Component Value Date/Time   TSH 1.730 08/09/2021 1012   Nutritional Lab Results  Component Value Date   VD25OH 50.9 05/31/2022   VD25OH 58.8 12/14/2021   VD25OH 26.1 (L) 08/09/2021    Attestations:   I, Clinical biochemist, acting as a Stage manager for [employer], DO., have compiled all relevant documentation for today's office visit on behalf of Thomasene Lot, DO, while in the presence of [employer], DO.  I have reviewed the above  documentation for accuracy and completeness, and I agree with the above. Megan [name], D.O.  The 21st Century Cures Act was signed into law in 2016 which includes the topic of electronic health records.  This provides immediate access to information in MyChart.  This includes consultation notes, operative notes, office notes, lab results and pathology reports.  If you have any questions about what you read please  let us know at your next visit so we can discuss your concerns and take corrective action if need be.  We are right here with you.

## 2022-12-07 DIAGNOSIS — H53143 Visual discomfort, bilateral: Secondary | ICD-10-CM | POA: Diagnosis not present

## 2022-12-07 DIAGNOSIS — H5213 Myopia, bilateral: Secondary | ICD-10-CM | POA: Diagnosis not present

## 2022-12-07 DIAGNOSIS — H40013 Open angle with borderline findings, low risk, bilateral: Secondary | ICD-10-CM | POA: Diagnosis not present

## 2022-12-07 DIAGNOSIS — E119 Type 2 diabetes mellitus without complications: Secondary | ICD-10-CM | POA: Diagnosis not present

## 2022-12-07 DIAGNOSIS — H524 Presbyopia: Secondary | ICD-10-CM | POA: Diagnosis not present

## 2022-12-07 DIAGNOSIS — H52223 Regular astigmatism, bilateral: Secondary | ICD-10-CM | POA: Diagnosis not present

## 2022-12-10 MED ORDER — BLOOD GLUCOSE MONITOR KIT: PACK | 0 refills | Status: AC

## 2022-12-23 ENCOUNTER — Other Ambulatory Visit (INDEPENDENT_AMBULATORY_CARE_PROVIDER_SITE_OTHER): Payer: Self-pay | Admitting: Family Medicine

## 2022-12-23 DIAGNOSIS — R638 Other symptoms and signs concerning food and fluid intake: Secondary | ICD-10-CM

## 2022-12-28 ENCOUNTER — Encounter (INDEPENDENT_AMBULATORY_CARE_PROVIDER_SITE_OTHER): Payer: Self-pay | Admitting: Family Medicine

## 2022-12-28 ENCOUNTER — Ambulatory Visit (INDEPENDENT_AMBULATORY_CARE_PROVIDER_SITE_OTHER): Payer: Medicare Other | Admitting: Family Medicine

## 2022-12-28 VITALS — BP 118/72 | HR 63 | Temp 98.3°F | Ht 59.0 in | Wt 191.0 lb

## 2022-12-28 DIAGNOSIS — Z7985 Long-term (current) use of injectable non-insulin antidiabetic drugs: Secondary | ICD-10-CM | POA: Diagnosis not present

## 2022-12-28 DIAGNOSIS — I152 Hypertension secondary to endocrine disorders: Secondary | ICD-10-CM | POA: Diagnosis not present

## 2022-12-28 DIAGNOSIS — Z6838 Body mass index (BMI) 38.0-38.9, adult: Secondary | ICD-10-CM

## 2022-12-28 DIAGNOSIS — E1159 Type 2 diabetes mellitus with other circulatory complications: Secondary | ICD-10-CM

## 2022-12-28 DIAGNOSIS — E1169 Type 2 diabetes mellitus with other specified complication: Secondary | ICD-10-CM

## 2022-12-28 DIAGNOSIS — R638 Other symptoms and signs concerning food and fluid intake: Secondary | ICD-10-CM | POA: Diagnosis not present

## 2022-12-28 DIAGNOSIS — Z6841 Body Mass Index (BMI) 40.0 and over, adult: Secondary | ICD-10-CM

## 2022-12-28 MED ORDER — BLOOD GLUCOSE TEST VI STRP
1.0000 | ORAL_STRIP | Freq: Three times a day (TID) | 0 refills | Status: AC
Start: 2022-12-28 — End: 2023-03-28

## 2022-12-28 MED ORDER — BLOOD GLUCOSE MONITORING SUPPL DEVI
1.0000 | Freq: Three times a day (TID) | 0 refills | Status: AC
Start: 2022-12-28 — End: ?

## 2022-12-28 MED ORDER — TIRZEPATIDE 5 MG/0.5ML ~~LOC~~ SOAJ: 5.0000 mg | SUBCUTANEOUS | 0 refills | Status: DC

## 2022-12-28 MED ORDER — TOPIRAMATE 50 MG PO TABS: ORAL_TABLET | ORAL | 0 refills | Status: DC

## 2022-12-28 MED ORDER — LANCET DEVICE MISC
1.0000 | Freq: Three times a day (TID) | 0 refills | Status: AC
Start: 2022-12-28 — End: 2023-01-27

## 2022-12-28 NOTE — Progress Notes (Signed)
Megan [name], D.O.  ABFM, ABOM Specializing in Clinical Bariatric Medicine  Office located at: [address] [place], [place]  [address]     Assessment and Plan:   Medications Discontinued During This Encounter  Medication Reason   topiramate (TOPAMAX) 50 MG tablet Reorder   tirzepatide [facility]) 5 MG/0.5ML Pen Reorder    Meds ordered this encounter  Medications   tirzepatide (MOUNJARO) 5 MG/0.5ML Pen    Sig: Inject 5 mg into the skin once a week.    Dispense:  2 mL    Refill:  0   topiramate (TOPAMAX) 50 MG tablet    Sig: 1 po BID    Dispense:  60 tablet    Refill:  0   Blood Glucose Monitoring Suppl DEVI    Sig: 1 each by Does not apply route in the morning, at noon, and at bedtime. May substitute to any manufacturer covered by patient's insurance.    Dispense:  1 each    Refill:  0   Glucose Blood (BLOOD GLUCOSE TEST STRIPS) STRP    Sig: 1 each by In Vitro route in the morning, at noon, and at bedtime. May substitute to any manufacturer covered by patient's insurance.    Dispense:  270 each    Refill:  0   Lancet Device MISC    Sig: 1 each by Does not apply route in the morning, at noon, and at bedtime. May substitute to any manufacturer covered by patient's insurance.    Dispense:  1 each    Refill:  0    Type 2 diabetes mellitus with obesity (HCC) Assessment: Condition is Not at goal.. Her A1c and insulin are elevated. Pt has been complaint with Mounjaro and feels as during the first day of the injection she experiences flu like symptoms but are resolved for the rest of the week. She has been thinking about taking her injection on Monday's to better accommodate her life schedule.  Lab Results  Component Value Date   HGBA1C 5.9 09/29/2022   HGBA1C 6.5 (H) 05/31/2022   HGBA1C 6.0 (H) 12/14/2021   INSULIN 23.4 05/31/2022   INSULIN 29.6 (H) 08/09/2021    Plan: - Continue Mounjaro 5mg  injections once weekly. I will refill today.   - I made it clear to  pt that I will manage her weight loss diabetic medications but the management of her diabetes as a whole will be managed by her PCP ie: diabetic eye, neuropathy exams, etc.   - Check Blood Sugar and Blood Pressure at that time. I will send in a generic glucometer/strips for pt.   - Recommend that any concerns about medicines should be directed at the prescribing provider.   - Recheck labs in 3 months if not done at Endo provider / PCP.    Abnormal craving- carbs Assessment: Condition is Controlled.. She continues Topamax daily and tolerates this well with no adverse side effects. Her cravings and hunger are well controlled and she doesn't feel the need to snack.   Plan: - Continue Topamax 50mg  BID at current dose. I will refill today.    Hypertension associated with diabetes (HCC) Assessment: Condition is Controlled.. This is well controlled with Norvasc and Hyzaar. Pt tolerates this well and denies any adverse side effects.  Last 3 blood pressure readings in our office are as follows: BP Readings from Last 3 Encounters:  12/28/22 118/72  12/04/22 126/70  11/16/22 120/78    Plan: - Continue Norvasc 2.5mg   daily and Hyzaar 100-25mg  daily.   - BP is 118/72 at goal today.   - Lifestyle changes such as following our low salt, heart healthy meal plan and engaging in a regular exercise program discussed   - Ambulatory blood pressure monitoring encouraged.  Reminded patient that if they ever feel poorly in any way, to check their blood pressure and pulse as well.  - We will continue to monitor closely alongside PCP/ specialists.  Pt reminded to also f/up with those individuals as instructed by them.    TREATMENT PLAN FOR OBESITY: BMI 45.0-49.9, adult (HCC)- current 38.56 Class 3 severe obesity with serious comorbidity and body mass index (BMI) of 45.0 to 49.9 in adult, unspecified obesity type [facility]) Assessment:  Megan [name] is here to discuss her progress with her obesity treatment  plan along with follow-up of her obesity related diagnoses. See Medical Weight Management Flowsheet for complete bioelectrical impedance results.  Condition is docourse: improving. Biometric data collected today, was reviewed with patient.   Since last office visit on 12/04/2022 patient's  Muscle mass has increased by 0.4lb. Fat mass has decreased by 4.4lb. Total body water has decreased by 2.2lb.  Counseling done on how various foods will affect these numbers and how to maximize success  Total lbs lost to date: 33lbs Total weight loss percentage to date: 14.73%  Plan: - Continue to follow the prescribed nutritional Category 2 meal plan with B and L options.   - Current visceral fat rating: 15.  The visceral fat rating should be < 12 in a female and < 10 in a female.  Visceral adipose tissue is a hormonally active component of total body fat. This body composition phenotype is associated with medical disorders such as metabolic syndrome, cardiovascular disease and several malignancies including prostate, breast, and colorectal cancers. -Starting goal: Lose 7-10% of weight via prudent nutritional plan and lifestyle changes.    Behavioral Intervention Additional resources provided today:  pill box Evidence-based interventions for health behavior change were utilized today including the discussion of self monitoring techniques, problem-solving barriers and SMART goal setting techniques.   Regarding patient's less desirable eating habits and patterns, we employed the technique of small changes.  Pt will specifically work on: Continue with exercise regiment and nutritional meal plan for next visit.    Recommended Physical Activity Goals  Megan [name] has been advised to slowly work up to 150 minutes of moderate intensity aerobic activity a week and strengthening exercises 2-3 times per week for cardiovascular health, weight loss maintenance and preservation of muscle mass.   She has agreed to Continue  current level of physical activity    FOLLOW UP: No follow-ups on file.  She was informed of the importance of frequent follow up visits to maximize her success with intensive lifestyle modifications for her multiple health conditions.  Subjective:   Chief complaint: Obesity Megan [name] is here to discuss her progress with her obesity treatment plan. She is on the the Category 2 Plan with breakfast and lunch options and states she is following her eating plan approximately 90% of the time. She states she is going to the gym and doing aerobics for 60 minutes 6 days per week and is playing pickle-ball for 20 minutes 1 day per week.  Interval History:  Megan [name] is here for a follow up office visit. Since last OV she has been doing well. Pt has worked on increasing her activity level and water intake. She has been working on increasing  her protein intake. She notices a difference in the way her clothes fit and look.    Pharmacotherapy for weight loss: She is currently taking Topamax and Mounjaro   for medical weight loss.  Denies side effects.    Review of Systems:  Pertinent positives were addressed with patient today.  Reviewed by clinician on day of visit: allergies, medications, problem list, medical history, surgical history, family history, social history, and previous encounter notes.  Weight Summary and Biometrics   Weight Lost Since Last Visit: 4lb  Weight Gained Since Last Visit: 0lb   Vitals Temp: 98.3 F (36.8 C) BP: 118/72 Pulse Rate: 63 SpO2: 99 %   Anthropometric Measurements Height: 4\' 11"  (1.499 m) Weight: 191 lb (86.6 kg) BMI (Calculated): 38.56 Weight at Last Visit: 195lb Weight Lost Since Last Visit: 4lb Weight Gained Since Last Visit: 0lb Starting Weight: 224lb Total Weight Loss (lbs): 33 lb (15 kg) Peak Weight: 234lb   Body Composition  Body Fat %: 45.8 % Fat Mass (lbs): 87.4 lbs Muscle Mass (lbs): 98.4 lbs Total Body Water (lbs): 72.8  lbs Visceral Fat Rating : 15   Other Clinical Data Fasting: no Labs: no Today's Visit #: 25 Starting Date: 08/09/20   Objective:   PHYSICAL EXAM: Blood pressure 118/72, pulse 63, temperature 98.3 F (36.8 C), height 4\' 11"  (1.499 m), weight 191 lb (86.6 kg), last menstrual period 09/20/2011, SpO2 99%. Body mass index is 38.58 kg/m.  General: Well Developed, well nourished, and in no acute distress.  HEENT: Normocephalic, atraumatic Skin: Warm and dry, cap RF less 2 sec, good turgor Chest:  Normal excursion, shape, no gross abn Respiratory: speaking in full sentences, no conversational dyspnea NeuroM-Sk: Ambulates w/o assistance, moves * 4 Psych: A and O *3, insight good, mood-full  DIAGNOSTIC DATA REVIEWED:  BMET    Component Value Date/Time   NA 141 09/29/2022 0000   K 3.9 09/29/2022 0000   CL 104 09/29/2022 0000   CO2 23 (A) 09/29/2022 0000   GLUCOSE 101 (H) 05/31/2022 0822   GLUCOSE 103 (H) 09/21/2015 1635   BUN 19 09/29/2022 0000   CREATININE 0.6 09/29/2022 0000   CREATININE 0.55 (L) 05/31/2022 0822   CALCIUM 9.9 09/29/2022 0000   GFRNONAA >60 09/21/2015 1635   GFRAA >60 09/21/2015 1635   Lab Results  Component Value Date   HGBA1C 5.9 09/29/2022   HGBA1C 6.4 (H) 08/09/2021   Lab Results  Component Value Date   INSULIN 23.4 05/31/2022   INSULIN 29.6 (H) 08/09/2021   Lab Results  Component Value Date   TSH 1.730 08/09/2021   CBC    Component Value Date/Time   WBC 6.7 09/29/2022 0000   WBC 7.6 09/21/2015 1635   RBC 5.11 09/29/2022 0000   HGB 14.4 09/29/2022 0000   HGB 15.1 05/31/2022 0822   HCT 44 09/29/2022 0000   HCT 46.5 05/31/2022 0822   PLT 249 09/29/2022 0000   PLT 228 05/31/2022 0822   MCV 86 05/31/2022 0822   MCH 27.9 05/31/2022 0822   MCH 27.2 09/21/2015 1635   MCHC 32.5 05/31/2022 0822   MCHC 32.3 09/21/2015 1635   RDW 14.1 05/31/2022 0822   Iron Studies No results found for: "IRON", "TIBC", "FERRITIN", "IRONPCTSAT" Lipid  Panel     Component Value Date/Time   CHOL 201 (A) 09/29/2022 0000   CHOL 237 (H) 05/31/2022 0822   TRIG 158 09/29/2022 0000   HDL 46 09/29/2022 0000   HDL 49 05/31/2022 0822   CHOLHDL  4.8 (H) 05/31/2022 0822   LDLCALC 127 09/29/2022 0000   LDLCALC 159 (H) 05/31/2022 0822   Hepatic Function Panel     Component Value Date/Time   PROT 6.6 05/31/2022 0822   ALBUMIN 2.2 (A) 09/29/2022 0000   ALBUMIN 4.6 05/31/2022 0822   AST 15 09/29/2022 0000   ALT 19 09/29/2022 0000   ALKPHOS 87 09/29/2022 0000   BILITOT 0.3 05/31/2022 0822      Component Value Date/Time   TSH 1.730 08/09/2021 1012   Nutritional Lab Results  Component Value Date   VD25OH 50.9 05/31/2022   VD25OH 58.8 12/14/2021   VD25OH 26.1 (L) 08/09/2021    Attestations:   I, Clinical biochemist, acting as a Stage manager for [employer], DO., have compiled all relevant documentation for today's office visit on behalf of Megan Lot, DO, while in the presence of [employer], DO.  I have reviewed the above documentation for accuracy and completeness, and I agree with the above. Megan [name], D.O.  The 21st Century Cures Act was signed into law in 2016 which includes the topic of electronic health records.  This provides immediate access to information in MyChart.  This includes consultation notes, operative notes, office notes, lab results and pathology reports.  If you have any questions about what you read please let us know at your next visit so we can discuss your concerns and take corrective action if need be.  We are right here with you.

## 2023-01-18 ENCOUNTER — Ambulatory Visit (INDEPENDENT_AMBULATORY_CARE_PROVIDER_SITE_OTHER): Payer: Medicare Other | Admitting: Physician Assistant

## 2023-01-18 ENCOUNTER — Encounter (INDEPENDENT_AMBULATORY_CARE_PROVIDER_SITE_OTHER): Payer: Self-pay | Admitting: Physician Assistant

## 2023-01-18 VITALS — BP 118/70 | HR 63 | Temp 97.6°F | Ht 59.0 in | Wt 190.0 lb

## 2023-01-18 DIAGNOSIS — E1159 Type 2 diabetes mellitus with other circulatory complications: Secondary | ICD-10-CM | POA: Diagnosis not present

## 2023-01-18 DIAGNOSIS — E1169 Type 2 diabetes mellitus with other specified complication: Secondary | ICD-10-CM

## 2023-01-18 DIAGNOSIS — R5383 Other fatigue: Secondary | ICD-10-CM | POA: Insufficient documentation

## 2023-01-18 DIAGNOSIS — E7849 Other hyperlipidemia: Secondary | ICD-10-CM | POA: Diagnosis not present

## 2023-01-18 DIAGNOSIS — E559 Vitamin D deficiency, unspecified: Secondary | ICD-10-CM | POA: Diagnosis not present

## 2023-01-18 DIAGNOSIS — Z7985 Long-term (current) use of injectable non-insulin antidiabetic drugs: Secondary | ICD-10-CM | POA: Diagnosis not present

## 2023-01-18 DIAGNOSIS — Z6838 Body mass index (BMI) 38.0-38.9, adult: Secondary | ICD-10-CM | POA: Insufficient documentation

## 2023-01-18 DIAGNOSIS — I152 Hypertension secondary to endocrine disorders: Secondary | ICD-10-CM

## 2023-01-18 MED ORDER — TIRZEPATIDE 5 MG/0.5ML ~~LOC~~ SOAJ
5.0000 mg | SUBCUTANEOUS | 0 refills | Status: DC
Start: 2023-01-18 — End: 2023-02-12

## 2023-01-18 NOTE — Progress Notes (Signed)
.smr  Office: [phone]  /  Fax: [phone]  WEIGHT SUMMARY AND BIOMETRICS  Vitals Temp: 97.6 F (36.4 C) BP: 118/70 Pulse Rate: 63 SpO2: 96 %   Anthropometric Measurements Height: 4\' 11"  (1.499 m) Weight: 190 lb (86.2 kg) BMI (Calculated): 38.35 Weight at Last Visit: 191 lb Weight Lost Since Last Visit: 1 lb Weight Gained Since Last Visit: 0 Starting Weight: 224 lb Total Weight Loss (lbs): 34 lb (15.4 kg) Peak Weight: 234 lb   Body Composition  Body Fat %: 44.8 % Fat Mass (lbs): 85.2 lbs Muscle Mass (lbs): 99.8 lbs Total Body Water (lbs): 74 lbs Visceral Fat Rating : 15   Other Clinical Data Fasting: yes Labs: yes Today's Visit #: 26 Starting Date: 08/09/20     HPI  Chief Complaint: OBESITY  Megan [name] is here to discuss her progress with her obesity treatment plan. She is on the the Category 2 Plan with breakfast options and states she is following her eating plan approximately 85 % of the time. She states she is exercising pickleball/ leg lifts 90 minutes 2 times per week.   Interval History:  Since last office visit she is down 1 lb  Hunger/appetite-moderate control. Not skipping meals Cravings- Still some "crunchy cravings" - Eats [employer] chips and satisfies  Stress- She and her husband are raising her daughter's children 70 yr old boy/11 yr old after daughter's death. Rewarding, but stressful as well.  Exercise-Plays pickleball with husband and friends regularly Hydration-100 oz fluids daily   Pharmacotherapy: Mounjaro 5 mg weekly. Denies mass in neck, dysphagia, dyspepsia, persistent hoarseness, abdominal pain, or N/V/Constipation or diarrhea. Has annual eye exam. Mood is stable.  Does note feeling she has the "flu" the day following her injection and is very fatigued/has to stay in bed the day after Mounjaro injection.   TREATMENT PLAN FOR OBESITY:  Recommended Dietary Goals  Juniper is currently in the action stage of change. As such, her goal  is to continue weight management plan. She has agreed to the Category 2 Plan.  Behavioral Intervention  We discussed the following Behavioral Modification Strategies today: increasing lean protein intake, decreasing simple carbohydrates , increasing vegetables, increasing lower glycemic fruits, increasing fiber rich foods, avoiding skipping meals, increasing water intake, emotional eating strategies and understanding the difference between hunger signals and cravings, work on managing stress, creating time for self-care and relaxation measures, continue to practice mindfulness when eating, and planning for success.  Additional resources provided today: NA  Recommended Physical Activity Goals  Amea has been advised to work up to 150 minutes of moderate intensity aerobic activity a week and strengthening exercises 2-3 times per week for cardiovascular health, weight loss maintenance and preservation of muscle mass.   She has agreed to Continue current level of physical activity    Pharmacotherapy We discussed various medication options to help Zimal with her weight loss efforts and we both agreed to continue Mounjaro 5 mg weekly for management of Type 2 diabetes.    Return in about 4 weeks (around 02/15/2023).Marland Kitchen She was informed of the importance of frequent follow up visits to maximize her success with intensive lifestyle modifications for her multiple health conditions.  PHYSICAL EXAM:  Blood pressure 118/70, pulse 63, temperature 97.6 F (36.4 C), height 4\' 11"  (1.499 m), weight 190 lb (86.2 kg), last menstrual period 09/20/2011, SpO2 96%. Body mass index is 38.38 kg/m.  General: She is overweight, cooperative, alert, well developed, and in no acute distress. PSYCH: Has normal mood, affect  and thought process.   Cardiovascular: HR 60's BP 118/70 Lungs: Normal breathing effort, no conversational dyspnea. Neuro: no focal deficits  DIAGNOSTIC DATA REVIEWED:  BMET    Component  Value Date/Time   NA 141 09/29/2022 0000   K 3.9 09/29/2022 0000   CL 104 09/29/2022 0000   CO2 23 (A) 09/29/2022 0000   GLUCOSE 101 (H) 05/31/2022 0822   GLUCOSE 103 (H) 09/21/2015 1635   BUN 19 09/29/2022 0000   CREATININE 0.6 09/29/2022 0000   CREATININE 0.55 (L) 05/31/2022 0822   CALCIUM 9.9 09/29/2022 0000   GFRNONAA >60 09/21/2015 1635   GFRAA >60 09/21/2015 1635   Lab Results  Component Value Date   HGBA1C 5.9 09/29/2022   HGBA1C 6.4 (H) 08/09/2021   Lab Results  Component Value Date   INSULIN 23.4 05/31/2022   INSULIN 29.6 (H) 08/09/2021   Lab Results  Component Value Date   TSH 1.730 08/09/2021   CBC    Component Value Date/Time   WBC 6.7 09/29/2022 0000   WBC 7.6 09/21/2015 1635   RBC 5.11 09/29/2022 0000   HGB 14.4 09/29/2022 0000   HGB 15.1 05/31/2022 0822   HCT 44 09/29/2022 0000   HCT 46.5 05/31/2022 0822   PLT 249 09/29/2022 0000   PLT 228 05/31/2022 0822   MCV 86 05/31/2022 0822   MCH 27.9 05/31/2022 0822   MCH 27.2 09/21/2015 1635   MCHC 32.5 05/31/2022 0822   MCHC 32.3 09/21/2015 1635   RDW 14.1 05/31/2022 0822   Iron Studies No results found for: "IRON", "TIBC", "FERRITIN", "IRONPCTSAT" Lipid Panel     Component Value Date/Time   CHOL 201 (A) 09/29/2022 0000   CHOL 237 (H) 05/31/2022 0822   TRIG 158 09/29/2022 0000   HDL 46 09/29/2022 0000   HDL 49 05/31/2022 0822   CHOLHDL 4.8 (H) 05/31/2022 0822   LDLCALC 127 09/29/2022 0000   LDLCALC 159 (H) 05/31/2022 0822   Hepatic Function Panel     Component Value Date/Time   PROT 6.6 05/31/2022 0822   ALBUMIN 2.2 (A) 09/29/2022 0000   ALBUMIN 4.6 05/31/2022 0822   AST 15 09/29/2022 0000   ALT 19 09/29/2022 0000   ALKPHOS 87 09/29/2022 0000   BILITOT 0.3 05/31/2022 0822      Component Value Date/Time   TSH 1.730 08/09/2021 1012   Nutritional Lab Results  Component Value Date   VD25OH 50.9 05/31/2022   VD25OH 58.8 12/14/2021   VD25OH 26.1 (L) 08/09/2021    ASSOCIATED  CONDITIONS ADDRESSED TODAY  ASSESSMENT AND PLAN  Problem List Items Addressed This Visit     Morbid obesity (HCC)   Relevant Medications   tirzepatide (MOUNJARO) 5 MG/0.5ML Pen   Vitamin D deficiency   Relevant Orders   VITAMIN D 25 Hydroxy (Vit-D Deficiency, Fractures)   Other hyperlipidemia   Relevant Orders   Lipid Panel With LDL/HDL Ratio   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 Last  A1c was 5.9 On ARB No Statin Medication(s): Mounjaro 5.0 mg SQ weekly  Lab Results  Component Value Date   HGBA1C 5.9 09/29/2022   HGBA1C 6.5 (H) 05/31/2022   HGBA1C 6.0 (H) 12/14/2021   Lab Results  Component Value Date   LDLCALC 127 09/29/2022   CREATININE 0.6 09/29/2022   No results found for: "GFR"  Plan: Continue and refill Mounjaro 5.0 mg SQ weekly Continue working on nutrition plan to decrease simple carbohydrates, increase lean proteins and exercise to promote weight loss and improve glycemic control. Recheck fasting labs today.   Hypertension Hypertension well controlled, asymptomatic, and no significant medication side effects noted.  Medication(s): losartan - hydrochlorothiazide 100-25 mg daily   Amlodipine 2.5 mg daily   Renal low normal range.   BP Readings from Last 3 Encounters:  01/18/23 118/70  12/28/22 118/72  12/04/22 126/70   Lab Results  Component Value Date   CREATININE 0.6 09/29/2022   CREATININE 0.55 (L) 05/31/2022   CREATININE 0.62 08/09/2021    No results found for: "GFR"  Plan: Continue all antihypertensives at current dosages. Monitor on Mounjaro as may need to decrease antihypertensive doses over time with further weight loss.  Continue to work on nutrition plan to promote weight loss and improve BP control.  Recheck labs today.   Hyperlipidemia LDL is not at goal. Medication(s): Mounjaro Cardiovascular risk factors: advanced age (older than 60 for men, 46 for women), diabetes mellitus, dyslipidemia, hypertension, and obesity (BMI >= 30 kg/m2)  Lab Results  Component Value Date   CHOL 201 (A) 09/29/2022   HDL 46 09/29/2022   LDLCALC 127 09/29/2022   TRIG 158 09/29/2022   CHOLHDL 4.8 (H) 05/31/2022   Lab Results  Component Value Date   ALT 19 09/29/2022   AST 15 09/29/2022   ALKPHOS 87 09/29/2022   BILITOT 0.3 05/31/2022   The 10-year ASCVD risk score (Arnett DK, et al., 2019) is: 20.5%*   Values used to calculate the score:     Age: 24 years     Sex: Female     Is Non-Hispanic African American: No     Diabetic: Yes     Tobacco smoker: No     Systolic Blood Pressure: 118 mmHg     Is BP treated: Yes     HDL Cholesterol: 46 mg/dL*     Total Cholesterol: 201 mg/dL*     * - Cholesterol units were assumed for this score calculation  Plan:  Continue to work on nutrition plan -decreasing simple carbohydrates, increasing lean proteins, decreasing saturated fats and cholesterol , avoiding trans fats and exercise as able to promote weight loss, improve lipids and decrease cardiovascular risks. Continue Mounjaro to decrease cardiovascular risks Recheck fasting labs today.    Vitamin D Deficiency Vitamin D is at goal of 50.  Most recent vitamin D level was 50.9. She is on OTC vitamin D 10,000 units daily. Lab Results  Component Value Date   VD25OH 50.9 05/31/2022   VD25OH 58.8 12/14/2021   VD25OH 26.1 (L) 08/09/2021    Plan: Continue OTC vitamin D 10,000 units daily. Low vitamin D levels can be  associated with adiposity and may result in leptin resistance and weight gain. Also associated with fatigue. Currently on vitamin D supplementation without any adverse effects.  Recheck vitamin D level today.   Fatigue:  Endorses fatigue. Notes increased fatigue the day following Mounjaro injection. Plan: Recheck labs today- CBC, B12, TSH, Vitamin D  ATTESTASTION STATEMENTS:  Reviewed  by clinician on day of visit: allergies, medications, problem list, medical history, surgical history, family history, social history, and previous encounter notes.   I have personally spent 37 minutes total time today in preparation, patient care, nutritional counseling and documentation for this visit, including the following: review of clinical lab tests; review of medical tests/procedures/services.      Emit Kuenzel, PA-C

## 2023-01-19 LAB — CMP14+EGFR
ALT: 20 IU/L (ref 0–32)
AST: 16 IU/L (ref 0–40)
Albumin: 4.5 g/dL (ref 3.9–4.9)
Alkaline Phosphatase: 87 IU/L (ref 44–121)
BUN/Creatinine Ratio: 39 — ABNORMAL HIGH (ref 12–28)
BUN: 26 mg/dL (ref 8–27)
Bilirubin Total: 0.3 mg/dL (ref 0.0–1.2)
CO2: 23 mmol/L (ref 20–29)
Calcium: 9.9 mg/dL (ref 8.7–10.3)
Chloride: 101 mmol/L (ref 96–106)
Creatinine, Ser: 0.66 mg/dL (ref 0.57–1.00)
Globulin, Total: 2.4 g/dL (ref 1.5–4.5)
Glucose: 92 mg/dL (ref 70–99)
Potassium: 3.8 mmol/L (ref 3.5–5.2)
Sodium: 139 mmol/L (ref 134–144)
Total Protein: 6.9 g/dL (ref 6.0–8.5)
eGFR: 94 mL/min/{1.73_m2} (ref >59)

## 2023-01-19 LAB — CBC WITH DIFFERENTIAL/PLATELET
Basophils Absolute: 0 10*3/uL (ref 0.0–0.2)
Basos: 0 %
EOS (ABSOLUTE): 0.1 10*3/uL (ref 0.0–0.4)
Eos: 2 %
Hematocrit: 46 % (ref 34.0–46.6)
Hemoglobin: 14.9 g/dL (ref 11.1–15.9)
Immature Grans (Abs): 0 10*3/uL (ref 0.0–0.1)
Immature Granulocytes: 0 %
Lymphocytes Absolute: 1.6 10*3/uL (ref 0.7–3.1)
Lymphs: 23 %
MCH: 28.1 pg (ref 26.6–33.0)
MCHC: 32.4 g/dL (ref 31.5–35.7)
MCV: 87 fL (ref 79–97)
Monocytes Absolute: 0.5 10*3/uL (ref 0.1–0.9)
Monocytes: 7 %
Neutrophils Absolute: 4.7 10*3/uL (ref 1.4–7.0)
Neutrophils: 68 %
Platelets: 240 10*3/uL (ref 150–450)
RBC: 5.31 x10E6/uL — ABNORMAL HIGH (ref 3.77–5.28)
RDW: 14.6 % (ref 11.7–15.4)
WBC: 7 10*3/uL (ref 3.4–10.8)

## 2023-01-19 LAB — HEMOGLOBIN A1C
Est. average glucose Bld gHb Est-mCnc: 120 mg/dL
Hgb A1c MFr Bld: 5.8 % — ABNORMAL HIGH (ref 4.8–5.6)

## 2023-01-19 LAB — LIPID PANEL WITH LDL/HDL RATIO
Cholesterol, Total: 153 mg/dL (ref 100–199)
HDL: 48 mg/dL (ref >39)
LDL Chol Calc (NIH): 82 mg/dL (ref 0–99)
LDL/HDL Ratio: 1.7 ratio (ref 0.0–3.2)
Triglycerides: 130 mg/dL (ref 0–149)
VLDL Cholesterol Cal: 23 mg/dL (ref 5–40)

## 2023-01-19 LAB — TSH: TSH: 2.08 u[IU]/mL (ref 0.450–4.500)

## 2023-01-19 LAB — VITAMIN B12: Vitamin B-12: 623 pg/mL (ref 232–1245)

## 2023-01-19 LAB — INSULIN, RANDOM: INSULIN: 17.2 u[IU]/mL (ref 2.6–24.9)

## 2023-01-19 LAB — VITAMIN D 25 HYDROXY (VIT D DEFICIENCY, FRACTURES): Vit D, 25-Hydroxy: 75.1 ng/mL (ref 30.0–100.0)

## 2023-01-25 ENCOUNTER — Ambulatory Visit (HOSPITAL_BASED_OUTPATIENT_CLINIC_OR_DEPARTMENT_OTHER): Payer: Medicare Other | Admitting: Obstetrics & Gynecology

## 2023-01-25 ENCOUNTER — Encounter (HOSPITAL_BASED_OUTPATIENT_CLINIC_OR_DEPARTMENT_OTHER): Payer: Self-pay

## 2023-01-30 ENCOUNTER — Ambulatory Visit (HOSPITAL_BASED_OUTPATIENT_CLINIC_OR_DEPARTMENT_OTHER): Payer: Medicare Other | Admitting: Obstetrics & Gynecology

## 2023-01-30 ENCOUNTER — Encounter (HOSPITAL_BASED_OUTPATIENT_CLINIC_OR_DEPARTMENT_OTHER): Payer: Self-pay | Admitting: Obstetrics & Gynecology

## 2023-01-30 VITALS — BP 122/68 | HR 60 | Ht 59.0 in | Wt 190.0 lb

## 2023-01-30 DIAGNOSIS — N816 Rectocele: Secondary | ICD-10-CM

## 2023-01-30 DIAGNOSIS — N950 Postmenopausal bleeding: Secondary | ICD-10-CM

## 2023-01-30 NOTE — Patient Instructions (Signed)
Call [phone] to schedule an appointment at [facility].

## 2023-01-30 NOTE — Progress Notes (Signed)
GYNECOLOGY  VISIT  CC:   post coital bleeding  HPI: [name] here for complaint of post coital bleeding that has occurred twice.  Pt had a hip replacement in January so had not had intercourse in more than two years.  Once she started being more SA, she reports having an episode of pinching pain with intercourse.  She saw some pink after this.  Then a second time, she had some additional pain and then there was some additional mucous/blood that was present.  She did have some cramping with this.  This stopped and she hasn't seen any bleeding/spotting for two to three weeks.    Last pap was 08/2022.    She is having some issues with diarrhea so a little concerned about rectal exam.     Past Medical History:  Diagnosis Date   Abnormal Pap smear of cervix    just a repeat done yrs ago   Anemia    Asthma    Back pain    Basal cell carcinoma 02/07/2018   right nasal sidewall [place] MOHS   Bilateral edema of lower extremity    Cancer (HCC) 04/24/2018   Nose   Constipation    Diverticulosis    Gallstones    Gastritis    acute   GERD (gastroesophageal reflux disease)    Hiatal hernia    Hip pain    Knee pain    Osteoarthritis    Other fatigue    Shortness of breath on exertion    Vitamin D deficiency     MEDS:   Current Outpatient Medications on File Prior to Visit  Medication Sig Dispense Refill   amLODipine (NORVASC) 2.5 MG tablet Take 1 tablet (2.5 mg total) by mouth daily. 30 tablet 0   blood glucose meter kit and supplies KIT Dispense based on patient and insurance preference. Use up to four times daily as directed. 1 each 0   Blood Glucose Monitoring Suppl DEVI 1 each by Does not apply route in the morning, at noon, and at bedtime. May substitute to any manufacturer covered by patient's insurance. 1 each 0   budesonide-formoterol (SYMBICORT) 160-4.5 MCG/ACT inhaler Inhale 1 puff into the lungs daily.     Cholecalciferol (VITAMIN D PO)  Take 10,000 Int'l Units by mouth daily.     cyclobenzaprine (FLEXERIL) 10 MG tablet Take 10 mg by mouth every 8 (eight) hours as needed for muscle spasms.     Glucose Blood (BLOOD GLUCOSE TEST STRIPS) STRP 1 each by In Vitro route in the morning, at noon, and at bedtime. May substitute to any manufacturer covered by patient's insurance. 270 each 0   hyoscyamine (ANASPAZ) 0.125 MG TBDP tablet Place under the tongue as needed.     ketoconazole (NIZORAL) 2 % shampoo APPLY TO SCALP LET SIT FOR 3-5 MINUTES AND THEN RINSE. 120 mL 11   losartan-hydrochlorothiazide (HYZAAR) 100-25 MG tablet Take 1 tablet by mouth daily.     meloxicam (MOBIC) 15 MG tablet Take 15 mg by mouth daily.     mometasone (ELOCON) 0.1 % lotion Apply topically two to three times weekly. 60 mL 1   Multiple Vitamins-Minerals (MULTIVITAMIN PO) Take by mouth. Women over 40 vitamin     pantoprazole (PROTONIX) 40 MG tablet Take 40 mg by mouth daily as needed.     polyethylene glycol powder (GLYCOLAX/MIRALAX) 17 GM/SCOOP powder Take 17 g by mouth 2 (two) times daily as needed. UNTIL REGULAR AND THEN  DAILY PRN 3350 g 1   tirzepatide (MOUNJARO) 5 MG/0.5ML Pen Inject 5 mg into the skin once a week. 2 mL 0   topiramate (TOPAMAX) 50 MG tablet 1 po BID 60 tablet 0   No current facility-administered medications on file prior to visit.    ALLERGIES: Augmentin [amoxicillin-pot clavulanate], Erythromycin base, Guaifenesin & derivatives, Hydrocodeine [dihydrocodeine], Metronidazole, and Nexium [esomeprazole magnesium]  SH:  married, non smoker  Review of Systems  Constitutional: Negative.   Genitourinary:        Vaginal spotting/bleeding about three weeks ago    PHYSICAL EXAMINATION:    BP 122/68 (BP Location: Right Wrist, Patient Position: Sitting, Cuff Size: Normal)   Pulse 60   Ht 4\' 11"  (1.499 m)   Wt 190 lb (86.2 kg)   LMP 09/20/2011 (Approximate)   BMI 38.38 kg/m     General appearance: alert, cooperative and appears stated  age Lymph:  no inguinal LAD noted  Pelvic: External genitalia:  no lesions              Urethra:  normal appearing urethra with no masses, tenderness or lesions              Bartholins and Skenes: normal                 Vagina: normal appearing vagina with normal color and discharge, no lesions, rectocele present              Cervix: no lesions              Bimanual Exam:  Uterus:  normal size, contour, position, consistency, mobility, non-tender              Adnexa: no mass, fullness, tenderness              Chaperone, Hendricks Milo, CMA, was present for exam.  Assessment/Plan: 1. Postmenopausal bleeding - endometrial biopsy recommended today as no clear source of bleeding noted on exam.  Pt declined as had one in the past that was very painful - US PELVIC COMPLETE WITH TRANSVAGINAL; Future  - topical lubricants recommended with any sexual activity.  Do not feel she needs any other treatment for PMP vaginal changes.  2. Rectocele

## 2023-01-31 DIAGNOSIS — R197 Diarrhea, unspecified: Secondary | ICD-10-CM | POA: Diagnosis not present

## 2023-01-31 DIAGNOSIS — R1084 Generalized abdominal pain: Secondary | ICD-10-CM | POA: Diagnosis not present

## 2023-02-07 ENCOUNTER — Ambulatory Visit (HOSPITAL_BASED_OUTPATIENT_CLINIC_OR_DEPARTMENT_OTHER)
Admission: RE | Admit: 2023-02-07 | Discharge: 2023-02-07 | Disposition: A | Discharge status: Home / Self Care | Payer: Medicare Other | Source: Ambulatory Visit | Attending: Obstetrics & Gynecology | Admitting: Obstetrics & Gynecology

## 2023-02-07 DIAGNOSIS — N950 Postmenopausal bleeding: Secondary | ICD-10-CM | POA: Diagnosis present

## 2023-02-08 ENCOUNTER — Ambulatory Visit (INDEPENDENT_AMBULATORY_CARE_PROVIDER_SITE_OTHER): Payer: Medicare Other | Admitting: Family Medicine

## 2023-02-08 NOTE — Telephone Encounter (Signed)
error 

## 2023-02-12 ENCOUNTER — Encounter (INDEPENDENT_AMBULATORY_CARE_PROVIDER_SITE_OTHER): Payer: Self-pay | Admitting: Family Medicine

## 2023-02-12 ENCOUNTER — Ambulatory Visit (INDEPENDENT_AMBULATORY_CARE_PROVIDER_SITE_OTHER): Payer: Medicare Other | Admitting: Family Medicine

## 2023-02-12 VITALS — BP 118/72 | HR 63 | Temp 98.0°F | Ht 59.0 in | Wt 190.0 lb

## 2023-02-12 DIAGNOSIS — I152 Hypertension secondary to endocrine disorders: Secondary | ICD-10-CM

## 2023-02-12 DIAGNOSIS — Z7985 Long-term (current) use of injectable non-insulin antidiabetic drugs: Secondary | ICD-10-CM | POA: Diagnosis not present

## 2023-02-12 DIAGNOSIS — E559 Vitamin D deficiency, unspecified: Secondary | ICD-10-CM | POA: Diagnosis not present

## 2023-02-12 DIAGNOSIS — E1159 Type 2 diabetes mellitus with other circulatory complications: Secondary | ICD-10-CM

## 2023-02-12 DIAGNOSIS — R638 Other symptoms and signs concerning food and fluid intake: Secondary | ICD-10-CM | POA: Diagnosis not present

## 2023-02-12 DIAGNOSIS — A084 Viral intestinal infection, unspecified: Secondary | ICD-10-CM

## 2023-02-12 DIAGNOSIS — E1169 Type 2 diabetes mellitus with other specified complication: Secondary | ICD-10-CM

## 2023-02-12 DIAGNOSIS — Z6838 Body mass index (BMI) 38.0-38.9, adult: Secondary | ICD-10-CM

## 2023-02-12 DIAGNOSIS — E785 Hyperlipidemia, unspecified: Secondary | ICD-10-CM

## 2023-02-12 MED ORDER — TOPIRAMATE 50 MG PO TABS: ORAL_TABLET | ORAL | 0 refills | Status: DC

## 2023-02-12 MED ORDER — VITAMIN D3 25 MCG (1000 UT) PO CAPS
ORAL_CAPSULE | ORAL | Status: AC
Start: 2023-02-12 — End: ?

## 2023-02-12 MED ORDER — TIRZEPATIDE 2.5 MG/0.5ML ~~LOC~~ SOAJ
2.5000 mg | SUBCUTANEOUS | 0 refills | Status: DC
Start: 2023-02-12 — End: 2023-04-11

## 2023-02-12 NOTE — Progress Notes (Signed)
Megan [name], D.O.  ABFM, ABOM Specializing in Clinical Bariatric Medicine  Office located at: [address] [place], [place]  [address]     Assessment and Plan:   No orders of the defined types were placed in this encounter.   There are no discontinued medications.   No orders of the defined types were placed in this encounter.   Vitamin D deficiency Pt reports she has not been taking her vitamin D.    Hyperlipidemia associated with type 2 diabetes mellitus (HCC) Taking crestor   Hypertension associated with diabetes (HCC) ***   Abnormal craving- carbs ***  Type 2 diabetes mellitus with obesity (HCC) *** On Mounjaro Avoid spicy foods and dairy products.  Decrease per pt  Pt endorses GI hower Told pt that mounjaro could also  Recommened to 5 to 2.5 mg to see   We discussed the possibility of Mounjaro also contributing to her GI symptoms. I recommend decreasing her Mounjaro from 5 mg to 2.5 mg to see if it      BMI 38.0-38.9,adult Current BMI 38.5 Class 3 severe obesity with serious comorbidity and body mass index (BMI) of 45.0 to 49.9 in adult, unspecified obesity type [facility]) Assessment & Plan: Megan [name] is here to discuss her progress with her obesity treatment plan along with follow-up of her obesity related diagnoses. See Medical Weight Management Flowsheet for complete bioelectrical impedance results.  Since last office visit on *** patient's  Muscle mass has {DID:29233} by ***lb. Fat mass has {DID:29233} by ***lb. Total body water has {DID:29233} by ***lb.  Counseling done on how various foods will affect these numbers and how to maximize success  Total lbs lost to date: *** Total weight loss percentage to date: ***   Behavioral Intervention Additional resources provided today: {weightresources:29185} Evidence-based interventions for health behavior change were utilized today including the discussion of self monitoring techniques,  problem-solving barriers and SMART goal setting techniques.   Regarding patient's less desirable eating habits and patterns, we employed the technique of small changes.  Pt will specifically work on: Following her meal plan at 75% for next visit.    FOLLOW UP: No follow-ups on file.*** She was informed of the importance of frequent follow up visits to maximize her success with intensive lifestyle modifications for her multiple health conditions.  Subjective:   Chief complaint: Obesity Megan [name] is here to discuss her progress with her obesity treatment plan. She is on the the Category 2 Plan and states she is following her eating plan approximately 20-50% of the time. She states she is not exercising.   Interval History:  Megan [name] is here for a follow up office visit. Since last OV,  ***  Pt was told she had gi bug and food posining and follow up in 1 month   Pt reports having a "GI bug" having loose stools/diarrhea and abdominal cramps radiating to her lower extremities, starting labor day weekend.  She was seen by GI and was told she has food poisoning.   She states she continues to experience these episodes sporadically.    Not been able to eat due to sx - got it and came back  Began labor day, sporadically returned Not drinking much water when she has loose stools.  Has been drinking Pedialyte.  Lower abd cramps - believed to be attributed by her statin - rosuvastatin  Prior to her GI sx, she was drinking 100 fl oz daily    Pharmacotherapy  for weight loss: She is currently taking  Mounjaro  for medical weight loss.  Denies side effects.    Review of Systems:  Pertinent positives were addressed with patient today.  Reviewed by clinician on day of visit: allergies, medications, problem list, medical history, surgical history, family history, social history, and previous encounter notes.  Weight Summary and Biometrics   Weight Lost Since Last Visit: 0lb  Weight  Gained Since Last Visit: 0lb  ***  Vitals Temp: 98 F (36.7 C) BP: 118/72 Pulse Rate: 63 SpO2: 98 %   Anthropometric Measurements Height: 4\' 11"  (1.499 m) Weight: 190 lb (86.2 kg) BMI (Calculated): 38.35 Weight at Last Visit: 190lb Weight Lost Since Last Visit: 0lb Weight Gained Since Last Visit: 0lb Starting Weight: 224lb Total Weight Loss (lbs): 34 lb (15.4 kg) Peak Weight: 234lb   Body Composition  Body Fat %: 46.7 % Fat Mass (lbs): 88.8 lbs Muscle Mass (lbs): 96.2 lbs Total Body Water (lbs): 76.2 lbs Visceral Fat Rating : 16   Other Clinical Data Fasting: no Labs: no Today's Visit #: 27 Starting Date: 08/09/20    Objective:   PHYSICAL EXAM: Blood pressure 118/72, pulse 63, temperature 98 F (36.7 C), height 4\' 11"  (1.499 m), weight 190 lb (86.2 kg), last menstrual period 09/20/2011, SpO2 98%. Body mass index is 38.38 kg/m.  General: Well Developed, well nourished, and in no acute distress.  HEENT: Normocephalic, atraumatic Skin: Warm and dry, cap RF less 2 sec, good turgor Chest:  Normal excursion, shape, no gross abn Respiratory: speaking in full sentences, no conversational dyspnea NeuroM-Sk: Ambulates w/o assistance, moves * 4 Psych: A and O *3, insight good, mood-full  DIAGNOSTIC DATA REVIEWED:  BMET    Component Value Date/Time   NA 139 01/18/2023 0919   K 3.8 01/18/2023 0919   CL 101 01/18/2023 0919   CO2 23 01/18/2023 0919   GLUCOSE 92 01/18/2023 0919   GLUCOSE 103 (H) 09/21/2015 1635   BUN 26 01/18/2023 0919   CREATININE 0.66 01/18/2023 0919   CALCIUM 9.9 01/18/2023 0919   GFRNONAA >60 09/21/2015 1635   GFRAA >60 09/21/2015 1635   Lab Results  Component Value Date   HGBA1C 5.8 (H) 01/18/2023   HGBA1C 6.4 (H) 08/09/2021   Lab Results  Component Value Date   INSULIN 17.2 01/18/2023   INSULIN 29.6 (H) 08/09/2021   Lab Results  Component Value Date   TSH 2.080 01/18/2023   CBC    Component Value Date/Time   WBC 7.0  01/18/2023 0919   WBC 7.6 09/21/2015 1635   RBC 5.31 (H) 01/18/2023 0919   RBC 5.11 09/29/2022 0000   HGB 14.9 01/18/2023 0919   HCT 46.0 01/18/2023 0919   PLT 240 01/18/2023 0919   MCV 87 01/18/2023 0919   MCH 28.1 01/18/2023 0919   MCH 27.2 09/21/2015 1635   MCHC 32.4 01/18/2023 0919   MCHC 32.3 09/21/2015 1635   RDW 14.6 01/18/2023 0919   Iron Studies No results found for: "IRON", "TIBC", "FERRITIN", "IRONPCTSAT" Lipid Panel     Component Value Date/Time   CHOL 153 01/18/2023 0919   TRIG 130 01/18/2023 0919   HDL 48 01/18/2023 0919   CHOLHDL 4.8 (H) 05/31/2022 0822   LDLCALC 82 01/18/2023 0919   Hepatic Function Panel     Component Value Date/Time   PROT 6.9 01/18/2023 0919   ALBUMIN 4.5 01/18/2023 0919   AST 16 01/18/2023 0919   ALT 20 01/18/2023 0919   ALKPHOS 87 01/18/2023 0919  BILITOT 0.3 01/18/2023 0919      Component Value Date/Time   TSH 2.080 01/18/2023 0919   Nutritional Lab Results  Component Value Date   VD25OH 75.1 01/18/2023   VD25OH 50.9 05/31/2022   VD25OH 58.8 12/14/2021    Attestations:   I, Isabelle Course, acting as a medical scribe for Thomasene Lot, DO., have compiled all relevant documentation for today's office visit on behalf of Thomasene Lot, DO, while in the presence of [employer], DO.  Reviewed by clinician on day of visit: allergies, medications, problem list, medical history, surgical history, family history, social history, and previous encounter notes pertinent to patient's obesity diagnosis.    This encounter took 42 total minutes of time including time coordinating the above treatment plan, any pre-visit chart review and post-visit documentation and reviewing any labs and/or imaging, reviewing pertinent prior notes, and providing counseling the patient about such treatment plan.  Approximately 50% of this time was spent in direct, face-to-face counseling and coordination of care.  I have reviewed the above  documentation for accuracy and completeness, and I agree with the above. Megan [name], D.O.  The 21st Century Cures Act was signed into law in 2016 which includes the topic of electronic health records.  This provides immediate access to information in MyChart.  This includes consultation notes, operative notes, office notes, lab results and pathology reports.  If you have any questions about what you read please let us know at your next visit so we can discuss your concerns and take corrective action if need be.  We are right here with you.

## 2023-02-19 DIAGNOSIS — R109 Unspecified abdominal pain: Secondary | ICD-10-CM | POA: Diagnosis not present

## 2023-02-19 DIAGNOSIS — R5383 Other fatigue: Secondary | ICD-10-CM | POA: Diagnosis not present

## 2023-02-28 DIAGNOSIS — R1032 Left lower quadrant pain: Secondary | ICD-10-CM | POA: Diagnosis not present

## 2023-03-12 ENCOUNTER — Ambulatory Visit (INDEPENDENT_AMBULATORY_CARE_PROVIDER_SITE_OTHER): Payer: Medicare Other | Admitting: Family Medicine

## 2023-03-12 ENCOUNTER — Encounter (INDEPENDENT_AMBULATORY_CARE_PROVIDER_SITE_OTHER): Payer: Self-pay | Admitting: Family Medicine

## 2023-03-12 VITALS — BP 126/74 | HR 55 | Temp 97.9°F | Ht 59.0 in | Wt 197.0 lb

## 2023-03-12 DIAGNOSIS — E66813 Obesity, class 3: Secondary | ICD-10-CM

## 2023-03-12 DIAGNOSIS — E1169 Type 2 diabetes mellitus with other specified complication: Secondary | ICD-10-CM

## 2023-03-12 DIAGNOSIS — Z7985 Long-term (current) use of injectable non-insulin antidiabetic drugs: Secondary | ICD-10-CM | POA: Diagnosis not present

## 2023-03-12 DIAGNOSIS — E559 Vitamin D deficiency, unspecified: Secondary | ICD-10-CM | POA: Diagnosis not present

## 2023-03-12 DIAGNOSIS — Z6839 Body mass index (BMI) 39.0-39.9, adult: Secondary | ICD-10-CM

## 2023-03-12 DIAGNOSIS — Z6838 Body mass index (BMI) 38.0-38.9, adult: Secondary | ICD-10-CM

## 2023-03-12 DIAGNOSIS — E1159 Type 2 diabetes mellitus with other circulatory complications: Secondary | ICD-10-CM

## 2023-03-12 DIAGNOSIS — I152 Hypertension secondary to endocrine disorders: Secondary | ICD-10-CM

## 2023-03-12 NOTE — Progress Notes (Signed)
Megan [name], D.O.  ABFM, ABOM Specializing in Clinical Bariatric Medicine  Office located at: [address] [place], [place]  [address]     Assessment and Plan:   Medications Discontinued During This Encounter  Medication Reason   amLODipine (NORVASC) 2.5 MG tablet    Cholecalciferol (VITAMIN D PO)    cyclobenzaprine (FLEXERIL) 10 MG tablet    meloxicam (MOBIC) 15 MG tablet    polyethylene glycol powder (GLYCOLAX/MIRALAX) 17 GM/SCOOP powder    losartan-hydrochlorothiazide (HYZAAR) 100-25 MG tablet     Type 2 diabetes mellitus with obesity (HCC) Assessment: Condition is Not optimized.. She has been off Mounjaro for 4 weeks now and endorses that her hunger and cravings are slowly coming back. She notices not getting full as much. She notes the side effects from stopping her medications are drastically changing her lifestyle. She does not monitor her blood sugar at home. Lab Results  Component Value Date   HGBA1C 5.8 (H) 01/18/2023   HGBA1C 5.9 09/29/2022   HGBA1C 6.5 (H) 05/31/2022   INSULIN 17.2 01/18/2023   INSULIN 23.4 05/31/2022   INSULIN 29.6 (H) 08/09/2021    Plan: - Take Mounjaro as per PCP recommendation.   - Monitor her blood sugar at home BID.   - Continue to decrease simple carbs/ sugars; increase fiber and proteins -> follow her meal plan.     - Recommend that any concerns about medicines should be directed at the prescribing provider. I will recheck labs in 3 months if not done at Endo provider / PCP.   Jasmine December will continue to work on weight loss, exercise, via their meal plan.   Vitamin D deficiency Assessment: Condition is Controlled.. She continues her vitamin D supplement as this was not one of the medications discontinued by her PCP. She tolerates this well as this gives her no adverse side effects.  Lab Results  Component Value Date   VD25OH 75.1 01/18/2023   VD25OH 50.9 05/31/2022   VD25OH 58.8 12/14/2021   Plan: - Continue OTC oral  vitamin D 1,000lU as directed and she was encouraged to continue to take the medicine until told otherwise.     - weight loss will likely improve availability of vitamin D, thus encouraged Brianca to continue with meal plan and their weight loss efforts to further improve this condition.  Thus, we will need to monitor levels regularly (every 3-4 mo on average) to keep levels within normal limits and prevent over supplementation.   Hypertension associated with diabetes (HCC) Assessment: Condition is Controlled. She no longer takes her BP medications as these were discontinued by her PCP. She is asymptomatic and her BP level is still well controlled at this time.  Last 3 blood pressure readings in our office are as follows: BP Readings from Last 3 Encounters:  03/12/23 126/74  02/12/23 118/72  01/30/23 122/68    Plan: - Continue to follow our low salt, heart healthy meal plan and engage in a regular exercise and avoid foods that are processed, frozen, or prepackaged to avoid excess salt.  Silas Flood [place] BP is 126/74 at goal today.   - Ambulatory and sedentary blood pressure monitoring is encouraged.    - We will continue to monitor closely alongside PCP/ specialists.  Pt reminded to also f/up with those individuals as instructed by them.    TREATMENT PLAN FOR OBESITY: BMI 38.0-38.9,adult Current BMI 39.77 Class 3 severe obesity with serious comorbidity and body mass index (BMI) of 45.0  to 49.9 in adult, unspecified obesity type [facility]) Assessment:  Megan [name] is here to discuss her progress with her obesity treatment plan along with follow-up of her obesity related diagnoses. See Medical Weight Management Flowsheet for complete bioelectrical impedance results.  Condition is not optimized. Biometric data collected today, was reviewed with patient.   Since last office visit on 02/12/2023 patient's  Muscle mass has decreased by 5lb. Fat mass has increased by 12.4lb. Counseling done on  how various foods will affect these numbers and how to maximize success  Total lbs lost to date: 27 Total weight loss percentage to date: 12.05%  Plan: - Adhere to the Category 2 nutritional plan.  Behavioral Intervention Additional resources provided today: patient declined Evidence-based interventions for health behavior change were utilized today including the discussion of self monitoring techniques, problem-solving barriers and SMART goal setting techniques.   Regarding patient's less desirable eating habits and patterns, we employed the technique of small changes.  Pt will specifically work on: exercise 5 days a wk for 30-28min and recommit to the meal plan for next visit.     She has agreed to Think about enjoyable ways to increase daily physical activity and overcoming barriers to exercise and Increase physical activity in their day and reduce sedentary time (increase NEAT).   FOLLOW UP: Return in about 3 weeks (around 04/02/2023).  She was informed of the importance of frequent follow up visits to maximize her success with intensive lifestyle modifications for her multiple health conditions.  Subjective:   Chief complaint: Obesity [name] is here to discuss her progress with her obesity treatment plan. She is on the the Category 2 Plan and states she is following her eating plan approximately 50% of the time. She states she is walking 8,000 steps.   Interval History:  Megan [name] is here for a follow up office visit.     Since last office visit:  She has been on vacation for the past weeks and notes not eating on plan while there. She informed me that she has been dealing with severe diarrhea and cramps to the point where she has to stay in the house unable to travel or leave home. Her NP took her off all medications to pinpoint which is medication is causing this. She will have each of her medications added slowly and have tests done. She has been off mounjaro for 4 weeks.  She continues vitamin D and Anaspaz for her severe cramps.   We reviewed her meal plan and all questions were answered.   Review of Systems:  Pertinent positives were addressed with patient today.  Reviewed by clinician on day of visit: allergies, medications, problem list, medical history, surgical history, family history, social history, and previous encounter notes.  Weight Summary and Biometrics   Weight Lost Since Last Visit: 0lb  Weight Gained Since Last Visit: 7lb   Vitals Temp: 97.9 F (36.6 C) BP: 126/74 Pulse Rate: (!) 55 SpO2: 98 %   Anthropometric Measurements Height: 4\' 11"  (1.499 m) Weight: 197 lb (89.4 kg) BMI (Calculated): 39.77 Weight at Last Visit: 190lb Weight Lost Since Last Visit: 0lb Weight Gained Since Last Visit: 7lb Starting Weight: 224lb Total Weight Loss (lbs): 28 lb (12.7 kg) Peak Weight: 234lb   Body Composition  Body Fat %: 51.3 % Fat Mass (lbs): 101.2 lbs Muscle Mass (lbs): 91.2 lbs Visceral Fat Rating : 17   Other Clinical Data Fasting: no Labs: no Today's Visit #: 28 Starting Date: 08/09/20  Objective:   PHYSICAL EXAM: Blood pressure 126/74, pulse (!) 55, temperature 97.9 F (36.6 C), height 4\' 11"  (1.499 m), weight 197 lb (89.4 kg), last menstrual period 09/20/2011, SpO2 98%. Body mass index is 39.79 kg/m.  General: Well Developed, well nourished, and in no acute distress.  HEENT: Normocephalic, atraumatic Skin: Warm and dry, cap RF less 2 sec, good turgor Chest:  Normal excursion, shape, no gross abn Respiratory: speaking in full sentences, no conversational dyspnea NeuroM-Sk: Ambulates w/o assistance, moves * 4 Psych: A and O *3, insight good, mood-full  DIAGNOSTIC DATA REVIEWED:  BMET    Component Value Date/Time   NA 139 01/18/2023 0919   K 3.8 01/18/2023 0919   CL 101 01/18/2023 0919   CO2 23 01/18/2023 0919   GLUCOSE 92 01/18/2023 0919   GLUCOSE 103 (H) 09/21/2015 1635   BUN 26 01/18/2023 0919    CREATININE 0.66 01/18/2023 0919   CALCIUM 9.9 01/18/2023 0919   GFRNONAA >60 09/21/2015 1635   GFRAA >60 09/21/2015 1635   Lab Results  Component Value Date   HGBA1C 5.8 (H) 01/18/2023   HGBA1C 6.4 (H) 08/09/2021   Lab Results  Component Value Date   INSULIN 17.2 01/18/2023   INSULIN 29.6 (H) 08/09/2021   Lab Results  Component Value Date   TSH 2.080 01/18/2023   CBC    Component Value Date/Time   WBC 7.0 01/18/2023 0919   WBC 7.6 09/21/2015 1635   RBC 5.31 (H) 01/18/2023 0919   RBC 5.11 09/29/2022 0000   HGB 14.9 01/18/2023 0919   HCT 46.0 01/18/2023 0919   PLT 240 01/18/2023 0919   MCV 87 01/18/2023 0919   MCH 28.1 01/18/2023 0919   MCH 27.2 09/21/2015 1635   MCHC 32.4 01/18/2023 0919   MCHC 32.3 09/21/2015 1635   RDW 14.6 01/18/2023 0919   Iron Studies No results found for: "IRON", "TIBC", "FERRITIN", "IRONPCTSAT" Lipid Panel     Component Value Date/Time   CHOL 153 01/18/2023 0919   TRIG 130 01/18/2023 0919   HDL 48 01/18/2023 0919   CHOLHDL 4.8 (H) 05/31/2022 0822   LDLCALC 82 01/18/2023 0919   Hepatic Function Panel     Component Value Date/Time   PROT 6.9 01/18/2023 0919   ALBUMIN 4.5 01/18/2023 0919   AST 16 01/18/2023 0919   ALT 20 01/18/2023 0919   ALKPHOS 87 01/18/2023 0919   BILITOT 0.3 01/18/2023 0919      Component Value Date/Time   TSH 2.080 01/18/2023 0919   Nutritional Lab Results  Component Value Date   VD25OH 75.1 01/18/2023   VD25OH 50.9 05/31/2022   VD25OH 58.8 12/14/2021    Attestations:   I, Clinical biochemist, acting as a Stage manager for [employer], DO., have compiled all relevant documentation for today's office visit on behalf of Thomasene Lot, DO, while in the presence of [employer], DO.  I have reviewed the above documentation for accuracy and completeness, and I agree with the above. Megan [name], D.O.  The 21st Century Cures Act was signed into law in 2016 which includes the topic of electronic  health records.  This provides immediate access to information in MyChart.  This includes consultation notes, operative notes, office notes, lab results and pathology reports.  If you have any questions about what you read please let us know at your next visit so we can discuss your concerns and take corrective action if need be.  We are right here with you.

## 2023-04-06 DIAGNOSIS — E119 Type 2 diabetes mellitus without complications: Secondary | ICD-10-CM | POA: Diagnosis not present

## 2023-04-06 DIAGNOSIS — E782 Mixed hyperlipidemia: Secondary | ICD-10-CM | POA: Diagnosis not present

## 2023-04-06 DIAGNOSIS — E559 Vitamin D deficiency, unspecified: Secondary | ICD-10-CM | POA: Diagnosis not present

## 2023-04-09 ENCOUNTER — Ambulatory Visit (INDEPENDENT_AMBULATORY_CARE_PROVIDER_SITE_OTHER): Payer: Medicare Other | Admitting: Family Medicine

## 2023-04-09 DIAGNOSIS — L821 Other seborrheic keratosis: Secondary | ICD-10-CM | POA: Diagnosis not present

## 2023-04-09 DIAGNOSIS — D224 Melanocytic nevi of scalp and neck: Secondary | ICD-10-CM | POA: Diagnosis not present

## 2023-04-09 DIAGNOSIS — D2261 Melanocytic nevi of right upper limb, including shoulder: Secondary | ICD-10-CM | POA: Diagnosis not present

## 2023-04-09 DIAGNOSIS — D2262 Melanocytic nevi of left upper limb, including shoulder: Secondary | ICD-10-CM | POA: Diagnosis not present

## 2023-04-09 DIAGNOSIS — D1801 Hemangioma of skin and subcutaneous tissue: Secondary | ICD-10-CM | POA: Diagnosis not present

## 2023-04-09 DIAGNOSIS — D225 Melanocytic nevi of trunk: Secondary | ICD-10-CM | POA: Diagnosis not present

## 2023-04-09 DIAGNOSIS — L218 Other seborrheic dermatitis: Secondary | ICD-10-CM | POA: Diagnosis not present

## 2023-04-09 DIAGNOSIS — L718 Other rosacea: Secondary | ICD-10-CM | POA: Diagnosis not present

## 2023-04-11 ENCOUNTER — Encounter (INDEPENDENT_AMBULATORY_CARE_PROVIDER_SITE_OTHER): Payer: Self-pay | Admitting: Internal Medicine

## 2023-04-11 ENCOUNTER — Ambulatory Visit (INDEPENDENT_AMBULATORY_CARE_PROVIDER_SITE_OTHER): Payer: Medicare Other | Admitting: Internal Medicine

## 2023-04-11 VITALS — BP 120/64 | HR 56 | Temp 98.2°F | Ht 59.0 in | Wt 194.0 lb

## 2023-04-11 DIAGNOSIS — E66812 Obesity, class 2: Secondary | ICD-10-CM | POA: Diagnosis not present

## 2023-04-11 DIAGNOSIS — Z6839 Body mass index (BMI) 39.0-39.9, adult: Secondary | ICD-10-CM

## 2023-04-11 DIAGNOSIS — E669 Obesity, unspecified: Secondary | ICD-10-CM

## 2023-04-11 DIAGNOSIS — R638 Other symptoms and signs concerning food and fluid intake: Secondary | ICD-10-CM

## 2023-04-11 DIAGNOSIS — T50905D Adverse effect of unspecified drugs, medicaments and biological substances, subsequent encounter: Secondary | ICD-10-CM | POA: Insufficient documentation

## 2023-04-11 DIAGNOSIS — T505X5A Adverse effect of appetite depressants, initial encounter: Secondary | ICD-10-CM

## 2023-04-11 DIAGNOSIS — E1169 Type 2 diabetes mellitus with other specified complication: Secondary | ICD-10-CM

## 2023-04-11 MED ORDER — TOPIRAMATE 50 MG PO TABS
ORAL_TABLET | ORAL | 0 refills | Status: DC
Start: 2023-04-11 — End: 2023-06-26

## 2023-04-11 NOTE — Progress Notes (Signed)
Office: [phone]  /  Fax: [phone]  Weight Summary And Biometrics  Vitals Temp: 98.2 F (36.8 C) BP: 120/64 (manual) Pulse Rate: (!) 56 SpO2: 97 %   Anthropometric Measurements Height: 4\' 11"  (1.499 m) Weight: 194 lb (88 kg) BMI (Calculated): 39.16 Weight at Last Visit: 197 lb Weight Lost Since Last Visit: 3 lb Weight Gained Since Last Visit: 0 lb Starting Weight: 224 lb Total Weight Loss (lbs): 31 lb (14.1 kg) Peak Weight: 234 lb   Body Composition  Body Fat %: 47.9 % Fat Mass (lbs): 93 lbs Muscle Mass (lbs): 96 lbs Total Body Water (lbs): 80.8 lbs Visceral Fat Rating : 16    No data recorded Today's Visit #: 29  Starting Date: 08/10/22   Subjective   Chief Complaint: Obesity  Megan [name] is here to discuss her progress with her obesity treatment plan. She is on the the Category 2 Plan and states she is following her eating plan approximately 50 % of the time. She states she is exercising 15 minutes 3 times per week.  Interval History:   This is my first encounter with Megan [name].  She is a patient of Dr. Sharee Holster who is affected by obesity she also has a history of hypertension, type 2 diabetes, hyperlipidemia.  She is following reduced calorie nutrition plan and has been exercising 50 minutes 3 times a week.  She reports feeling somewhat restricted in the foods that she can eat and sometimes feel like has some dieting fatigue.  She had been on topiramate and Mounjaro but was experiencing which she describes as severe abdominal cramping so medications were discontinued by primary care team.  Since then symptoms have resolved but she is noticing an increase in hunger, cravings and impaired sense of satiety.  She self restarted topiramate and is currently taking 50 mg twice a day and has been doing so for the last 2 weeks.  She denies any adverse effects on the medication.   Orexigenic Control:  Reports problems with appetite and hunger signals.  Reports  problems with satiety and satiation.  Denies problems with eating patterns and portion control.  Reports abnormal cravings. Reports feeling deprived or restricted.   Barriers identified: strong hunger signals and impaired satiety / inhibitory control, low volume of physical activity at present , difficulty maintaining a reduced calorie state, and has some dieting fatigue starting .   Pharmacotherapy for weight loss: She is currently taking Topiramate (off label use, single agent) with adequate clinical response  and without side effects..   Assessment and Plan   Treatment Plan For Obesity:  Recommended Dietary Goals  Megan [name] is currently in the action stage of change. As such, her goal is to continue weight management plan. She has agreed to: portion control, balanced plate and making smarter food choices, such as increasing vegetables, protein intake and reducing simple carbohydrates and processed foods  and continue current plan  Behavioral Intervention  We discussed the following Behavioral Modification Strategies today: increasing lean protein intake to established goals, increasing vegetables, increasing lower glycemic fruits, increasing fiber rich foods, work on meal planning and preparation, continue to practice mindfulness when eating, planning for success, celebration eating strategies, and also counseled on eating balanced she does not like to track calories, we discussed that if 80% of the time she is eating nutritious meals she could have a little bit more freedom or refeeding the rest of the time.  This will prevent patient fatigue due to restrictive dieting .  Additional resources provided today: Handout on traveling and holiday eating strategies, Provided with personal guidance and instructions on how to use [employer] for healthy meal ideas and cooking in bulk., and category 2 packet with supporting documents  Recommended Physical Activity Goals  Megan [name] has been advised  to work up to 150 minutes of moderate intensity aerobic activity a week and strengthening exercises 2-3 times per week for cardiovascular health, weight loss maintenance and preservation of muscle mass.   She has agreed to :  Think about enjoyable ways to increase daily physical activity and overcoming barriers to exercise and Increase physical activity in their day and reduce sedentary time (increase NEAT).  Pharmacotherapy  We discussed various medication options to help Megan [name] with her weight loss efforts and we both agreed to : continue current anti-obesity medication regimen and recommend checking a CMP and magnesium at her next office visit to exclude metabolic acidosis as the cause of patient's gastrointestinal symptoms.  Her abdominal cramping has resolved with discontinuation of GLP-1 and that might have been the culprit.  She will avoid GLP-1 treatment for the time being.  She does report tolerating the lower dose but I would like for her to just continue on monotherapy for now.  Associated Conditions Addressed Today  Abnormal food appetite Assessment & Plan: Improved on topiramate.  She has increased orexigenic signaling, impaired satiety and inhibitory control. This is secondary to an abnormal energy regulation system and pathological neurohormonal pathways characteristic of excess adiposity.  In addition to nutritional and behavioral strategies she benefits from ongoing pharmacotherapy.  Avoid GLP-1's for the time being due to gastrointestinal side effects   Orders: -     Topiramate; 1 po BID  Dispense: 60 tablet; Refill: 0  Type 2 diabetes mellitus with obesity (HCC) Assessment & Plan: HgbA1c is at goal for age and comorbid conditions.  She reports her A1c has gone up to 6 after discontinuation of Mounjaro.  She is currently off medications.   Lab Results  Component Value Date   HGBA1C 5.8 (H) 01/18/2023   HGBA1C 5.9 09/29/2022   HGBA1C 6.5 (H) 05/31/2022   Lab Results   Component Value Date   LDLCALC 82 01/18/2023   CREATININE 0.66 01/18/2023   Patient is new to me so I will not sure if she has been on metformin or an SGLT2 drug for her diabetes but if no contraindications exist this may be a reasonable option and may also help her with her weight loss goals.  This will be explored at her next appointment    Class 2 severe obesity with serious comorbidity and body mass index (BMI) of 39.0 to 39.9 in adult, unspecified obesity type [facility]) Assessment & Plan: Since March 2023 she has lost 30 pounds which is 14% of total body weight.  She has lost some muscle mass along the way but no more than 20% which is permissible.  She is experiencing some dieting fatigue and we discussed different strategies today and provide her with some additional resources.  She would benefit from increasing volume of physical activity and increasing protein intake to reduce the risk of plateauing.  She has noticed a difference after discontinuation of antiobesity medication and increased orixegenic signaling.  She has self restarted on topiramate and feels that medication has been helpful and is not having side effects.  She will continue medication I recommend checking a CMP and magnesium at her next office visit for monitoring.  Avoid GLP-1 drugs for now consider metformin  or SGLT2 drug for diabetes management if no contraindications exist.   Adverse drug experience, subsequent encounter Assessment & Plan: Based on symptom description and temporal relationship I suspect that some of her side effects were induced by GLP-1.  I do recommend checking a CMP at 4 weeks to make sure that she did not develop metabolic acidosis on topiramate which may also manifest as GI side effects.  She will avoid GLP-1 therapy for the time being.           Objective   Physical Exam:  Blood pressure 120/64, pulse (!) 56, temperature 98.2 F (36.8 C), height 4\' 11"  (1.499 m), weight 194 lb (88 kg),  last menstrual period 09/20/2011, SpO2 97%. Body mass index is 39.18 kg/m.  General: She is overweight, cooperative, alert, well developed, and in no acute distress. PSYCH: Has normal mood, affect and thought process.   HEENT: EOMI, sclerae are anicteric. Lungs: Normal breathing effort, no conversational dyspnea. Extremities: No edema.  Neurologic: No gross sensory or motor deficits. No tremors or fasciculations noted.    Diagnostic Data Reviewed:  BMET    Component Value Date/Time   NA 139 01/18/2023 0919   K 3.8 01/18/2023 0919   CL 101 01/18/2023 0919   CO2 23 01/18/2023 0919   GLUCOSE 92 01/18/2023 0919   GLUCOSE 103 (H) 09/21/2015 1635   BUN 26 01/18/2023 0919   CREATININE 0.66 01/18/2023 0919   CALCIUM 9.9 01/18/2023 0919   GFRNONAA >60 09/21/2015 1635   GFRAA >60 09/21/2015 1635   Lab Results  Component Value Date   HGBA1C 5.8 (H) 01/18/2023   HGBA1C 6.4 (H) 08/09/2021   Lab Results  Component Value Date   INSULIN 17.2 01/18/2023   INSULIN 29.6 (H) 08/09/2021   Lab Results  Component Value Date   TSH 2.080 01/18/2023   CBC    Component Value Date/Time   WBC 7.0 01/18/2023 0919   WBC 7.6 09/21/2015 1635   RBC 5.31 (H) 01/18/2023 0919   RBC 5.11 09/29/2022 0000   HGB 14.9 01/18/2023 0919   HCT 46.0 01/18/2023 0919   PLT 240 01/18/2023 0919   MCV 87 01/18/2023 0919   MCH 28.1 01/18/2023 0919   MCH 27.2 09/21/2015 1635   MCHC 32.4 01/18/2023 0919   MCHC 32.3 09/21/2015 1635   RDW 14.6 01/18/2023 0919   Iron Studies No results found for: "IRON", "TIBC", "FERRITIN", "IRONPCTSAT" Lipid Panel     Component Value Date/Time   CHOL 153 01/18/2023 0919   TRIG 130 01/18/2023 0919   HDL 48 01/18/2023 0919   CHOLHDL 4.8 (H) 05/31/2022 0822   LDLCALC 82 01/18/2023 0919   Hepatic Function Panel     Component Value Date/Time   PROT 6.9 01/18/2023 0919   ALBUMIN 4.5 01/18/2023 0919   AST 16 01/18/2023 0919   ALT 20 01/18/2023 0919   ALKPHOS 87  01/18/2023 0919   BILITOT 0.3 01/18/2023 0919      Component Value Date/Time   TSH 2.080 01/18/2023 0919   Nutritional Lab Results  Component Value Date   VD25OH 75.1 01/18/2023   VD25OH 50.9 05/31/2022   VD25OH 58.8 12/14/2021    Follow-Up   Return in about 4 weeks (around 05/09/2023).Marland Kitchen She was informed of the importance of frequent follow up visits to maximize her success with intensive lifestyle modifications for her multiple health conditions.  Attestation Statement   Reviewed by clinician on day of visit: allergies, medications, problem list, medical history, surgical history, family history,  social history, and previous encounter notes.     Worthy Rancher, MD

## 2023-04-11 NOTE — Assessment & Plan Note (Signed)
Based on symptom description and temporal relationship I suspect that some of her side effects were induced by GLP-1.  I do recommend checking a CMP at 4 weeks to make sure that she did not develop metabolic acidosis on topiramate which may also manifest as GI side effects.  She will avoid GLP-1 therapy for the time being.

## 2023-04-11 NOTE — Assessment & Plan Note (Signed)
HgbA1c is at goal for age and comorbid conditions.  She reports her A1c has gone up to 6 after discontinuation of Mounjaro.  She is currently off medications.   Lab Results  Component Value Date   HGBA1C 5.8 (H) 01/18/2023   HGBA1C 5.9 09/29/2022   HGBA1C 6.5 (H) 05/31/2022   Lab Results  Component Value Date   LDLCALC 82 01/18/2023   CREATININE 0.66 01/18/2023   Patient is new to me so I will not sure if she has been on metformin or an SGLT2 drug for her diabetes but if no contraindications exist this may be a reasonable option and may also help her with her weight loss goals.  This will be explored at her next appointment

## 2023-04-11 NOTE — Assessment & Plan Note (Signed)
Since March 2023 she has lost 30 pounds which is 14% of total body weight.  She has lost some muscle mass along the way but no more than 20% which is permissible.  She is experiencing some dieting fatigue and we discussed different strategies today and provide her with some additional resources.  She would benefit from increasing volume of physical activity and increasing protein intake to reduce the risk of plateauing.  She has noticed a difference after discontinuation of antiobesity medication and increased orixegenic signaling.  She has self restarted on topiramate and feels that medication has been helpful and is not having side effects.  She will continue medication I recommend checking a CMP and magnesium at her next office visit for monitoring.  Avoid GLP-1 drugs for now consider metformin or SGLT2 drug for diabetes management if no contraindications exist.

## 2023-04-11 NOTE — Assessment & Plan Note (Signed)
Improved on topiramate.  She has increased orexigenic signaling, impaired satiety and inhibitory control. This is secondary to an abnormal energy regulation system and pathological neurohormonal pathways characteristic of excess adiposity.  In addition to nutritional and behavioral strategies she benefits from ongoing pharmacotherapy.  Avoid GLP-1's for the time being due to gastrointestinal side effects

## 2023-04-12 DIAGNOSIS — E559 Vitamin D deficiency, unspecified: Secondary | ICD-10-CM | POA: Diagnosis not present

## 2023-04-12 DIAGNOSIS — K219 Gastro-esophageal reflux disease without esophagitis: Secondary | ICD-10-CM | POA: Diagnosis not present

## 2023-04-12 DIAGNOSIS — Z0000 Encounter for general adult medical examination without abnormal findings: Secondary | ICD-10-CM | POA: Diagnosis not present

## 2023-04-12 DIAGNOSIS — R7303 Prediabetes: Secondary | ICD-10-CM | POA: Diagnosis not present

## 2023-04-12 DIAGNOSIS — Z23 Encounter for immunization: Secondary | ICD-10-CM | POA: Diagnosis not present

## 2023-04-12 DIAGNOSIS — I10 Essential (primary) hypertension: Secondary | ICD-10-CM | POA: Diagnosis not present

## 2023-04-12 DIAGNOSIS — J4520 Mild intermittent asthma, uncomplicated: Secondary | ICD-10-CM | POA: Diagnosis not present

## 2023-04-12 DIAGNOSIS — E782 Mixed hyperlipidemia: Secondary | ICD-10-CM | POA: Diagnosis not present

## 2023-05-07 ENCOUNTER — Ambulatory Visit (INDEPENDENT_AMBULATORY_CARE_PROVIDER_SITE_OTHER): Payer: Medicare Other | Admitting: Family Medicine

## 2023-05-07 ENCOUNTER — Encounter (INDEPENDENT_AMBULATORY_CARE_PROVIDER_SITE_OTHER): Payer: Self-pay

## 2023-05-28 ENCOUNTER — Ambulatory Visit (INDEPENDENT_AMBULATORY_CARE_PROVIDER_SITE_OTHER): Payer: Medicare Other | Admitting: Internal Medicine

## 2023-05-29 ENCOUNTER — Encounter (INDEPENDENT_AMBULATORY_CARE_PROVIDER_SITE_OTHER): Payer: Self-pay | Admitting: Internal Medicine

## 2023-05-29 ENCOUNTER — Ambulatory Visit (INDEPENDENT_AMBULATORY_CARE_PROVIDER_SITE_OTHER): Payer: Medicare Other | Admitting: Internal Medicine

## 2023-05-29 VITALS — BP 128/80 | HR 71 | Temp 97.9°F | Ht 59.0 in | Wt 200.0 lb

## 2023-05-29 DIAGNOSIS — R638 Other symptoms and signs concerning food and fluid intake: Secondary | ICD-10-CM

## 2023-05-29 DIAGNOSIS — Z6839 Body mass index (BMI) 39.0-39.9, adult: Secondary | ICD-10-CM | POA: Diagnosis not present

## 2023-05-29 DIAGNOSIS — E1169 Type 2 diabetes mellitus with other specified complication: Secondary | ICD-10-CM | POA: Diagnosis not present

## 2023-05-29 DIAGNOSIS — E66812 Obesity, class 2: Secondary | ICD-10-CM

## 2023-05-29 DIAGNOSIS — E669 Obesity, unspecified: Secondary | ICD-10-CM

## 2023-05-29 NOTE — Assessment & Plan Note (Addendum)
 See obesity treatment plan.  Patient was again provided with copies of category 2 meal plan.  She was also educated on the energy balance model and carb insulin  model.  She will start taking topiramate  consistently for orexigenic signaling.  She has a history of loose stools but has not done elimination diets.  There is been discontinuation of several drugs because of gastrointestinal symptoms.  Most recently amlodipine .  I advised patient to keep a journal to determine cause and relationship between food she may have a sugar intolerance.

## 2023-05-29 NOTE — Assessment & Plan Note (Signed)
 HgbA1c is at goal for age and comorbid conditions.  She reports her A1c has gone up to 6 after discontinuation of Mounjaro.  She is currently off medications.   Lab Results  Component Value Date   HGBA1C 5.8 (H) 01/18/2023   HGBA1C 5.9 09/29/2022   HGBA1C 6.5 (H) 05/31/2022   Lab Results  Component Value Date   LDLCALC 82 01/18/2023   CREATININE 0.66 01/18/2023   Patient is new to me so I will not sure if she has been on metformin or an SGLT2 drug for her diabetes but if no contraindications exist this may be a reasonable option and may also help her with her weight loss goals.  This will be explored at her next appointment

## 2023-05-29 NOTE — Progress Notes (Signed)
 Office: [phone]  /  Fax: [phone]  Weight Summary And Biometrics  Vitals Temp: 97.9 F (36.6 C) BP: 128/80 Pulse Rate: 71 SpO2: 95 %   Anthropometric Measurements Height: 4' 11 (1.499 m) Weight: 200 lb (90.7 kg) BMI (Calculated): 40.37 Weight at Last Visit: 194 lb Weight Lost Since Last Visit: 0 lb Weight Gained Since Last Visit: 6 lb Starting Weight: 224 lb Total Weight Loss (lbs): 24 lb (10.9 kg) Peak Weight: 234 lb   Body Composition  Body Fat %: 48.3 % Fat Mass (lbs): 96.8 lbs Muscle Mass (lbs): 98.4 lbs Total Body Water (lbs): 76.6 lbs Visceral Fat Rating : 17    No data recorded Today's Visit #: 30  Starting Date: 08/10/22   Subjective   Chief Complaint: Obesity  Megan [name] is here to discuss her progress with her obesity treatment plan. She is on the the Category 2 Plan and states she is following her eating plan approximately 25 % of the time. She states she is exercising 30 minutes 3 times per week.  Interval History:   Discussed the use of AI scribe software for clinical note transcription with the patient, who gave verbal consent to proceed.  History of Present Illness   The patient, with a history of diabetes and hypertension, presents for medical weight management. She reports a history of adverse reactions to multiple medications, including Mounjaro , amlodipine , and metformin , which have caused symptoms such as flu-like symptoms, leg and stomach cramps, and severe diarrhea. Due to these side effects, the patient's nurse practitioner discontinued all medications to start anew.  The patient reports that after discontinuation of Mounjaro , she experienced a return of hunger and cravings, and an increase in blood pressure. She also noted a weight gain, with an increase in clothing size. The patient has been inconsistent with the use of topiramate , a medication previously prescribed for appetite suppression, but is willing to restart it.  The  patient also reports a sensitive stomach, with a history of gastritis. She has been experiencing loose stools, which she suspects may be related to her use of artificial sweeteners, specifically Splenda.  The patient lives in a household with children and notes that managing dietary habits can be challenging due to the children's preference for carbohydrate-rich foods. She expresses a desire to understand more about her condition and treatment, and to make changes to her diet, including reducing carbohydrate intake and increasing protein consumption. She also expresses an interest in incorporating more organic foods into her diet.       Orexigenic Control:    Barriers identified: orthopedic problems, medical conditions or chronic pain affecting mobility and medical comorbidities.   Pharmacotherapy for weight loss: She is currently taking Topiramate  (off label use, single agent) with adequate clinical response , without side effects., and has not been taking consistently .   Assessment and Plan   Treatment Plan For Obesity:  Recommended Dietary Goals  Megan [name] is currently in the action stage of change. As such, her goal is to continue weight management plan. She has agreed to: continue current plan  Behavioral Intervention  We discussed the following Behavioral Modification Strategies today: continue to work on maintaining a reduced calorie state, getting the recommended amount of protein, incorporating whole foods, making healthy choices, staying well hydrated and practicing mindfulness when eating..  Additional resources provided today:  Benefits of protein and weight management and information on the carb insulin  model  Recommended Physical Activity Goals  Megan [name] has been advised to work up  to 150 minutes of moderate intensity aerobic activity a week and strengthening exercises 2-3 times per week for cardiovascular health, weight loss maintenance and preservation of muscle mass.    She has agreed to :  Think about enjoyable ways to increase daily physical activity and overcoming barriers to exercise and Increase physical activity in their day and reduce sedentary time (increase NEAT).  Pharmacotherapy  We discussed various medication options to help Megan [name] with her weight loss efforts and we both agreed to : continue current anti-obesity medication regimen and take topiramate  consistently  Associated Conditions Addressed and Impacted by Obesity Treatment  Abnormal food appetite Assessment & Plan: Improved on topiramate .  She has increased orexigenic signaling, impaired satiety and inhibitory control. This is secondary to an abnormal energy regulation system and pathological neurohormonal pathways characteristic of excess adiposity.  In addition to nutritional and behavioral strategies she benefits from ongoing pharmacotherapy.  Avoid GLP-1's for the time being due to history gastrointestinal side effects    Type 2 diabetes mellitus with obesity (HCC) Assessment & Plan: HgbA1c is at goal for age and comorbid conditions.  She reports her A1c has gone up to 6 after discontinuation of Mounjaro .  She is currently off medications.   Lab Results  Component Value Date   HGBA1C 5.8 (H) 01/18/2023   HGBA1C 5.9 09/29/2022   HGBA1C 6.5 (H) 05/31/2022   Lab Results  Component Value Date   LDLCALC 82 01/18/2023   CREATININE 0.66 01/18/2023   Patient is new to me so I will not sure if she has been on metformin  or an SGLT2 drug for her diabetes but if no contraindications exist this may be a reasonable option and may also help her with her weight loss goals.  This will be explored at her next appointment    Class 2 severe obesity with serious comorbidity and body mass index (BMI) of 39.0 to 39.9 in adult, unspecified obesity type [facility]) Assessment & Plan: See obesity treatment plan.  Patient was again provided with copies of category 2 meal plan.  She was also educated  on the energy balance model and carb insulin  model.  She will start taking topiramate  consistently for orexigenic signaling.  She has a history of loose stools but has not done elimination diets.  There is been discontinuation of several drugs because of gastrointestinal symptoms.  Most recently amlodipine .  I advised patient to keep a journal to determine cause and relationship between food she may have a sugar intolerance.      Objective   Physical Exam:  Blood pressure 128/80, pulse 71, temperature 97.9 F (36.6 C), height 4' 11 (1.499 m), weight 200 lb (90.7 kg), last menstrual period 09/20/2011, SpO2 95%. Body mass index is 40.4 kg/m.  General: She is overweight, cooperative, alert, well developed, and in no acute distress. PSYCH: Has normal mood, affect and thought process.   HEENT: EOMI, sclerae are anicteric. Lungs: Normal breathing effort, no conversational dyspnea. Extremities: No edema.  Neurologic: No gross sensory or motor deficits. No tremors or fasciculations noted.    Diagnostic Data Reviewed:  BMET    Component Value Date/Time   NA 139 01/18/2023 0919   K 3.8 01/18/2023 0919   CL 101 01/18/2023 0919   CO2 23 01/18/2023 0919   GLUCOSE 92 01/18/2023 0919   GLUCOSE 103 (H) 09/21/2015 1635   BUN 26 01/18/2023 0919   CREATININE 0.66 01/18/2023 0919   CALCIUM 9.9 01/18/2023 0919   GFRNONAA >60 09/21/2015 1635  GFRAA >60 09/21/2015 1635   Lab Results  Component Value Date   HGBA1C 5.8 (H) 01/18/2023   HGBA1C 6.4 (H) 08/09/2021   Lab Results  Component Value Date   INSULIN  17.2 01/18/2023   INSULIN  29.6 (H) 08/09/2021   Lab Results  Component Value Date   TSH 2.080 01/18/2023   CBC    Component Value Date/Time   WBC 7.0 01/18/2023 0919   WBC 7.6 09/21/2015 1635   RBC 5.31 (H) 01/18/2023 0919   RBC 5.11 09/29/2022 0000   HGB 14.9 01/18/2023 0919   HCT 46.0 01/18/2023 0919   PLT 240 01/18/2023 0919   MCV 87 01/18/2023 0919   MCH 28.1 01/18/2023  0919   MCH 27.2 09/21/2015 1635   MCHC 32.4 01/18/2023 0919   MCHC 32.3 09/21/2015 1635   RDW 14.6 01/18/2023 0919   Iron Studies No results found for: IRON, TIBC, FERRITIN, IRONPCTSAT Lipid Panel     Component Value Date/Time   CHOL 153 01/18/2023 0919   TRIG 130 01/18/2023 0919   HDL 48 01/18/2023 0919   CHOLHDL 4.8 (H) 05/31/2022 0822   LDLCALC 82 01/18/2023 0919   Hepatic Function Panel     Component Value Date/Time   PROT 6.9 01/18/2023 0919   ALBUMIN 4.5 01/18/2023 0919   AST 16 01/18/2023 0919   ALT 20 01/18/2023 0919   ALKPHOS 87 01/18/2023 0919   BILITOT 0.3 01/18/2023 0919      Component Value Date/Time   TSH 2.080 01/18/2023 0919   Nutritional Lab Results  Component Value Date   VD25OH 75.1 01/18/2023   VD25OH 50.9 05/31/2022   VD25OH 58.8 12/14/2021    Follow-Up   Return in about 3 weeks (around 06/19/2023) for For Weight Mangement with Dr. Francyne - 40 minutes - patient establishing with me... She was informed of the importance of frequent follow up visits to maximize her success with intensive lifestyle modifications for her multiple health conditions.  Attestation Statement   Reviewed by clinician on day of visit: allergies, medications, problem list, medical history, surgical history, family history, social history, and previous encounter notes.     Lucas Francyne, MD

## 2023-05-29 NOTE — Assessment & Plan Note (Addendum)
 Improved on topiramate .  She has increased orexigenic signaling, impaired satiety and inhibitory control. This is secondary to an abnormal energy regulation system and pathological neurohormonal pathways characteristic of excess adiposity.  In addition to nutritional and behavioral strategies she benefits from ongoing pharmacotherapy.  Avoid GLP-1's for the time being due to history gastrointestinal side effects

## 2023-06-26 ENCOUNTER — Encounter (INDEPENDENT_AMBULATORY_CARE_PROVIDER_SITE_OTHER): Payer: Self-pay | Admitting: Internal Medicine

## 2023-06-26 ENCOUNTER — Ambulatory Visit (INDEPENDENT_AMBULATORY_CARE_PROVIDER_SITE_OTHER): Payer: Medicare Other | Admitting: Internal Medicine

## 2023-06-26 VITALS — BP 130/80 | HR 57 | Temp 97.5°F | Ht 59.0 in | Wt 200.0 lb

## 2023-06-26 DIAGNOSIS — E279 Disorder of adrenal gland, unspecified: Secondary | ICD-10-CM | POA: Insufficient documentation

## 2023-06-26 DIAGNOSIS — R1033 Periumbilical pain: Secondary | ICD-10-CM | POA: Diagnosis not present

## 2023-06-26 DIAGNOSIS — E1169 Type 2 diabetes mellitus with other specified complication: Secondary | ICD-10-CM | POA: Diagnosis not present

## 2023-06-26 DIAGNOSIS — R638 Other symptoms and signs concerning food and fluid intake: Secondary | ICD-10-CM | POA: Diagnosis not present

## 2023-06-26 DIAGNOSIS — R931 Abnormal findings on diagnostic imaging of heart and coronary circulation: Secondary | ICD-10-CM | POA: Insufficient documentation

## 2023-06-26 DIAGNOSIS — E669 Obesity, unspecified: Secondary | ICD-10-CM

## 2023-06-26 DIAGNOSIS — Z6841 Body Mass Index (BMI) 40.0 and over, adult: Secondary | ICD-10-CM

## 2023-06-26 MED ORDER — TOPIRAMATE 50 MG PO TABS
ORAL_TABLET | ORAL | 0 refills | Status: DC
Start: 2023-06-26 — End: 2023-07-25

## 2023-06-26 NOTE — Assessment & Plan Note (Signed)
 HgbA1c is at goal for age and comorbid conditions.  She is no longer on Mounjaro  so anticipate A1c increasing.  She is currently off medications.  She also had side effects to metformin .   Lab Results  Component Value Date   HGBA1C 5.8 (H) 01/18/2023   HGBA1C 5.9 09/29/2022   HGBA1C 6.5 (H) 05/31/2022   Lab Results  Component Value Date   LDLCALC 82 01/18/2023   CREATININE 0.66 01/18/2023   Patient has been educated on the carb insulin  model of obesity.  She has also been provided with a reduced calorie nutrition plan with a low glycemic load.  We will avoid GLP-1 and metformin  because of GI side effects.  She may be a good candidate for SGLT2, but may have to come off hydrochlorothiazide due to muscle cramps.

## 2023-06-26 NOTE — Assessment & Plan Note (Signed)
 Somewhat of a complicated history.  It seems that it was felt that some of her stomach symptoms were related to GLP-1 therapy in the past but her stomach pain persist.  I noticed that she does have cardiovascular disease and considering her age I feel it may be worthwhile to evaluate for mesenteric vascular disease.  I will send message to her gastroenterologist Dr. Rollin to get his opinion.  I have sent electronic correspondence.

## 2023-06-26 NOTE — Progress Notes (Signed)
 Office: [phone]  /  Fax: [phone]  Weight Summary And Biometrics  Vitals Temp: (!) 97.5 F (36.4 C) BP: 130/80 Pulse Rate: (!) 57 SpO2: 97 %   Anthropometric Measurements Height: 4' 11 (1.499 m) Weight: 200 lb (90.7 kg) BMI (Calculated): 40.37 Weight at Last Visit: 200 lb Weight Lost Since Last Visit: 0 lb Weight Gained Since Last Visit: 0 lb Starting Weight: 224 lb Total Weight Loss (lbs): 24 lb (10.9 kg) Peak Weight: 234 lb   Body Composition  Body Fat %: 49.1 % Fat Mass (lbs): 98.6 lbs Muscle Mass (lbs): 97 lbs Total Body Water (lbs): 77.4 lbs Visceral Fat Rating : 17    No data recorded Today's Visit #: 31  Starting Date: 08/09/21   Subjective   Chief Complaint: Obesity  Megan [name] is here to discuss her progress with her obesity treatment plan. Megan [name] is on the the Category 2 Plan and states Megan [name] is following her eating plan approximately 80 % of the time. Megan [name] states Megan [name] is exercising 30-45 minutes 2 times per week.  Weight Progress Since Last Visit:  Discussed the use of AI scribe software for clinical note transcription with the patient, who gave verbal consent to proceed.  History of Present Illness   Megan [name] is a 73 year old female with type 2 diabetes, hypertension, and digestive problems who presents for medical weight management.  Megan [name] has previously used medications like Mounjaro  for weight loss but discontinued them due to significant gastrointestinal issues, reaching a dose of 5 mg before stopping. Megan [name] follows a meal plan, focusing on high protein intake and eliminating harmful foods, and averages 7,000 to 10,000 steps per day.  Megan [name] reports having a longstanding history of gastrointestinal issues and Megan [name] is followed by gastroenterology.  Megan [name] describes as this gnawing at times severe cramping pain in her mid abdomen unrelated to meals or bowel movements.  Megan [name] has been prescribed an anticholinergic without relief.  Had been felt  that this was due to GLP-1 in the past but Megan [name] is no longer on medication and symptoms still persist.  Megan [name] reports having an upper and lower endoscopy.  I do not see any recent CT imaging.  Megan [name] denies any nausea or vomiting.  Megan [name] has a history of cholecystectomy.  This patient also has an adrenal nodule 1.6 cm detected incidentally in 2017 on abdominal CT Megan [name] is not aware if this has been evaluated further.  Her type 2 diabetes management is complicated by gastrointestinal issues. Megan [name] previously used metformin , which caused intolerable loose stools. Megan [name] currently takes losartan for blood pressure and rosuvastatin twice a week. Megan [name] has a history of a nodule in her left adrenal gland, identified in 2017, which has not been followed up. Megan [name] also has a history of gallbladder removal due to emergency surgery.     Challenges affecting patient progress: lack of partner or family support, strong hunger signals and/or impaired satiety / inhibitory control, having difficulty with meal prep and planning, medical comorbidities, difficulty maintaining a reduced calorie state, and Megan [name] takes care of her grandchildren which sometimes affects nutritional intake .   Orexigenic Control: Reports problems with appetite and hunger signals.  Reports problems with satiety and satiation.  Denies problems with eating patterns and portion control.  Denies abnormal cravings. Denies feeling deprived or restricted.   Pharmacotherapy for weight management: Megan [name] is currently taking Topiramate  (off label use, single agent) with adequate clinical response  and without side effects..   Assessment and  Plan   Treatment Plan For Obesity:  Recommended Dietary Goals  Megan [name] is currently in the action stage of change. As such, her goal is to continue weight management plan. Megan [name] has agreed to: follow the Category 2 plan - 1200 kcal per day and patient also provided a 7-day meal plan targeting 1200 cal high in protein  Behavioral  Health and Counseling  We discussed the following behavioral modification strategies today: increasing lean protein intake to established goals, decreasing simple carbohydrates , increasing vegetables, increasing fiber rich foods, and work on gradual implementation focusing on 1 meal at a time and cooking in bulk and not feeling overwhelmed by trying to make too many changes at once.  We also discussed that Megan [name] could eat certain foods that Megan [name] likes in moderation and to also look at some of the family staples and find healthier recipes to make these meals.  Additional education and resources provided today: Megan [name] was provided with category 2 meal plan and a 7-day meal plan targeting 1200 kcal/day  Recommended Physical Activity Goals  Megan [name] has been advised to work up to 150 minutes of moderate intensity aerobic activity a week and strengthening exercises 2-3 times per week for cardiovascular health, weight loss maintenance and preservation of muscle mass.   Megan [name] has agreed to :  Think about enjoyable ways to increase daily physical activity and overcoming barriers to exercise and Increase physical activity in their day and reduce sedentary time (increase NEAT).  Pharmacotherapy  We discussed various medication options to help Megan [name] with her weight loss efforts and we both agreed to : adequate clinical response to current dose, continue current regimen  Associated Conditions Impacted by Obesity Treatment  Adrenal nodule [facility]) Assessment & Plan: Measuring 1.6 cm incidentally found on a CT scan in 2017.  I reviewed available records and do not see functional testing or further evaluation.  I was also trying to communicate with her PCP electronically but unable to do via epic messaging.  We will reach out to her PCPs office to inform of finding.  Considering comorbidities functional status should be assessed   Abnormal food appetite Assessment & Plan:  Improved on topiramate .  Megan [name] has  increased orexigenic signaling, impaired satiety and inhibitory control. This is secondary to an abnormal energy regulation system and pathological neurohormonal pathways characteristic of excess adiposity.  In addition to nutritional and behavioral strategies Megan [name] benefits from ongoing pharmacotherapy.  Avoid GLP-1's for the time being due to history gastrointestinal side effects   Orders: -     Topiramate ; 1 po BID  Dispense: 60 tablet; Refill: 0  Type 2 diabetes mellitus with obesity (HCC) Assessment & Plan: HgbA1c is at goal for age and comorbid conditions.  Megan [name] is no longer on Mounjaro  so anticipate A1c increasing.  Megan [name] is currently off medications.  Megan [name] also had side effects to metformin .   Lab Results  Component Value Date   HGBA1C 5.8 (H) 01/18/2023   HGBA1C 5.9 09/29/2022   HGBA1C 6.5 (H) 05/31/2022   Lab Results  Component Value Date   LDLCALC 82 01/18/2023   CREATININE 0.66 01/18/2023   Patient has been educated on the carb insulin  model of obesity.  Megan [name] has also been provided with a reduced calorie nutrition plan with a low glycemic load.  We will avoid GLP-1 and metformin  because of GI side effects.  Megan [name] may be a good candidate for SGLT2, but may have to come off hydrochlorothiazide due to muscle cramps.    Periumbilical  abdominal pain Assessment & Plan: Somewhat of a complicated history.  It seems that it was felt that some of her stomach symptoms were related to GLP-1 therapy in the past but her stomach pain persist.  I noticed that Megan [name] does have cardiovascular disease and considering her age I feel it may be worthwhile to evaluate for mesenteric vascular disease.  I will send message to her gastroenterologist Dr. Rollin to get his opinion.  I have sent electronic correspondence.   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  Megan [name] is aware of increased cardiovascular risk and her most recent LDL was 86 Megan [name] has not been taking her statin every day as Megan [name] thinks this is resulting in muscle cramps.  I think her muscle cramps are coming from hydrochlorothiazide and Megan [name] does benefit from taking her statin every day also at a higher intensity.      Objective   Physical Exam:  Blood pressure 130/80, pulse (!) 57, temperature (!) 97.5 F (36.4 C), height 4' 11 (1.499 m), weight 200 lb (90.7 kg), last menstrual period 09/20/2011, SpO2 97%. Body mass index is 40.4 kg/m.  General: Megan [name] is overweight, cooperative, alert, well developed, and in no acute distress. PSYCH: Has normal mood, affect and thought process.   HEENT: EOMI, sclerae are anicteric. Lungs: Normal breathing effort, no conversational dyspnea. Extremities: No edema.  Neurologic: No gross sensory or motor deficits. No tremors or fasciculations noted.    Diagnostic Data Reviewed:  BMET    Component Value Date/Time   NA 139 01/18/2023 0919   K 3.8 01/18/2023 0919   CL 101 01/18/2023 0919   CO2 23 01/18/2023 0919   GLUCOSE 92 01/18/2023 0919   GLUCOSE 103 (H) 09/21/2015 1635   BUN 26 01/18/2023 0919   CREATININE 0.66 01/18/2023 0919   CALCIUM 9.9 01/18/2023 0919   GFRNONAA >60 09/21/2015 1635   GFRAA >60 09/21/2015 1635   Lab Results  Component Value Date   HGBA1C 5.8 (H) 01/18/2023   HGBA1C 6.4 (H) 08/09/2021   Lab Results  Component Value Date   INSULIN  17.2 01/18/2023   INSULIN  29.6 (H) 08/09/2021   Lab Results  Component Value Date   TSH 2.080 01/18/2023   CBC    Component Value Date/Time   WBC 7.0 01/18/2023 0919   WBC 7.6 09/21/2015 1635   RBC 5.31 (H) 01/18/2023 0919   RBC 5.11 09/29/2022 0000   HGB 14.9 01/18/2023 0919   HCT 46.0 01/18/2023 0919   PLT 240 01/18/2023 0919   MCV 87 01/18/2023 0919   MCH 28.1 01/18/2023 0919   MCH 27.2 09/21/2015 1635   MCHC 32.4 01/18/2023 0919   MCHC 32.3  09/21/2015 1635   RDW 14.6 01/18/2023 0919   Iron Studies No results found for: IRON, TIBC, FERRITIN, IRONPCTSAT Lipid Panel     Component Value Date/Time   CHOL 153 01/18/2023 0919   TRIG 130 01/18/2023 0919   HDL 48 01/18/2023 0919   CHOLHDL 4.8 (H) 05/31/2022 0822   LDLCALC 82 01/18/2023 0919   Hepatic Function Panel     Component Value Date/Time   PROT 6.9 01/18/2023 0919   ALBUMIN 4.5 01/18/2023 0919   AST 16 01/18/2023 0919   ALT 20 01/18/2023 0919   ALKPHOS 87 01/18/2023 0919   BILITOT 0.3 01/18/2023 0919      Component Value Date/Time  TSH 2.080 01/18/2023 0919   Nutritional Lab Results  Component Value Date   VD25OH 75.1 01/18/2023   VD25OH 50.9 05/31/2022   VD25OH 58.8 12/14/2021    Follow-Up   Return in about 4 weeks (around 07/24/2023) for For Weight Mangement with Dr. [email or web address].SABRA Megan [name] was informed of the importance of frequent follow up visits to maximize her success with intensive lifestyle modifications for her multiple health conditions.  Attestation Statement   Reviewed by clinician on day of visit: allergies, medications, problem list, medical history, surgical history, family history, social history, and previous encounter notes.   I have spent 60 minutes in the care of the patient today including: preparing to see patient (e.g. review and interpretation of tests, old notes ), obtaining and/or reviewing separately obtained history, performing a medically appropriate examination or evaluation, counseling and educating the patient, ordering medications, test or procedures, referring and communicating with other healthcare professionals, documenting clinical information in the electronic or other health care record, independently interpreting results and communicating results to the patient, family, or caregiver, and care coordination with gastroenterology and primary care   Lucas Francyne, MD

## 2023-06-26 NOTE — Assessment & Plan Note (Signed)
 Measuring 1.6 cm incidentally found on a CT scan in 2017.  I reviewed available records and do not see functional testing or further evaluation.  I was also trying to communicate with her PCP electronically but unable to do via epic messaging.  We will reach out to her PCPs office to inform of finding.  Considering comorbidities functional status should be assessed

## 2023-06-26 NOTE — Assessment & Plan Note (Signed)
 Patient has an elevated coronary artery calcium score and has diabetes, hypertension, hyperlipidemia and is affected by obesity.  She is aware of increased cardiovascular risk and her most recent LDL was 86 she has not been taking her statin every day as she thinks this is resulting in muscle cramps.  I think her muscle cramps are coming from hydrochlorothiazide and she does benefit from taking her statin every day also at a higher intensity.

## 2023-06-26 NOTE — Assessment & Plan Note (Signed)
 Improved on topiramate .  She has increased orexigenic signaling, impaired satiety and inhibitory control. This is secondary to an abnormal energy regulation system and pathological neurohormonal pathways characteristic of excess adiposity.  In addition to nutritional and behavioral strategies she benefits from ongoing pharmacotherapy.  Avoid GLP-1's for the time being due to history gastrointestinal side effects

## 2023-07-04 DIAGNOSIS — R1013 Epigastric pain: Secondary | ICD-10-CM | POA: Diagnosis not present

## 2023-07-04 DIAGNOSIS — M549 Dorsalgia, unspecified: Secondary | ICD-10-CM | POA: Diagnosis not present

## 2023-07-04 DIAGNOSIS — R0781 Pleurodynia: Secondary | ICD-10-CM | POA: Diagnosis not present

## 2023-07-16 ENCOUNTER — Other Ambulatory Visit (HOSPITAL_BASED_OUTPATIENT_CLINIC_OR_DEPARTMENT_OTHER): Payer: Self-pay | Admitting: Gastroenterology

## 2023-07-16 DIAGNOSIS — R1013 Epigastric pain: Secondary | ICD-10-CM

## 2023-07-25 ENCOUNTER — Encounter (INDEPENDENT_AMBULATORY_CARE_PROVIDER_SITE_OTHER): Payer: Self-pay | Admitting: Internal Medicine

## 2023-07-25 ENCOUNTER — Ambulatory Visit (INDEPENDENT_AMBULATORY_CARE_PROVIDER_SITE_OTHER): Payer: Medicare Other | Admitting: Internal Medicine

## 2023-07-25 VITALS — BP 126/80 | HR 61 | Temp 97.6°F | Ht 59.0 in | Wt 206.0 lb

## 2023-07-25 DIAGNOSIS — R1033 Periumbilical pain: Secondary | ICD-10-CM

## 2023-07-25 DIAGNOSIS — E279 Disorder of adrenal gland, unspecified: Secondary | ICD-10-CM

## 2023-07-25 DIAGNOSIS — E1169 Type 2 diabetes mellitus with other specified complication: Secondary | ICD-10-CM

## 2023-07-25 DIAGNOSIS — R638 Other symptoms and signs concerning food and fluid intake: Secondary | ICD-10-CM | POA: Diagnosis not present

## 2023-07-25 DIAGNOSIS — E66812 Obesity, class 2: Secondary | ICD-10-CM

## 2023-07-25 DIAGNOSIS — Z6841 Body Mass Index (BMI) 40.0 and over, adult: Secondary | ICD-10-CM

## 2023-07-25 MED ORDER — ZONISAMIDE 25 MG PO CAPS
25.0000 mg | ORAL_CAPSULE | Freq: Every evening | ORAL | 0 refills | Status: DC
Start: 2023-07-25 — End: 2023-08-29

## 2023-07-25 NOTE — Assessment & Plan Note (Signed)
 Patient was recently seen by gastroenterology and is scheduled to have a CT scan of the abdomen and EGD.  We will follow-up on test results.

## 2023-07-25 NOTE — Assessment & Plan Note (Signed)
 Patient will be scheduled in 4 weeks for indirect calorimetry to assess metabolic rate we will then start from scratch and build a reduced calorie nutrition plan based on medical conditions and dietary preferences.  She is relatively new to me and I feel that we need to calibrate her nutritional strategy.  She will work on increasing protein to 30 g per meal in the short-term and not skipping meals.  Also work on increasing water intake.

## 2023-07-25 NOTE — Progress Notes (Signed)
 Office: [phone]  /  Fax: [phone]  Weight Summary And Biometrics  Vitals Temp: 97.6 F (36.4 C) BP: 126/80 Pulse Rate: 61 SpO2: 100 %   Anthropometric Measurements Height: 4\' 11"  (1.499 m) Weight: 206 lb (93.4 kg) BMI (Calculated): 41.58 Weight at Last Visit: 206 lb Weight Lost Since Last Visit: 0 lb Weight Gained Since Last Visit: 6 lb Starting Weight: 224 lb Total Weight Loss (lbs): 18 lb (8.165 kg) Peak Weight: 234 lb   Body Composition  Body Fat %: 50.1 % Fat Mass (lbs): 103.4 lbs Muscle Mass (lbs): 97.8 lbs Total Body Water (lbs): 79.8 lbs Visceral Fat Rating : 18    No data recorded Today's Visit #: 32  Starting Date: 08/09/21   Subjective   Chief Complaint: Obesity  Interval History Discussed the use of AI scribe software for clinical note transcription with the patient, who gave verbal consent to proceed.  History of Present Illness   Megan [name] "Megan [name]" is a 73 year old female with obesity, type 2 diabetes, and hypertension who presents for medical weight management.  She has been experiencing significant stress, which she believes has impacted her weight management efforts. Her blood pressure spiked to 210/101 mmHg during a period of stress but returned to normal after resuming her blood pressure medication and monitoring her salt intake.  She has been experiencing stomach cramps for the past three weeks. She was prescribed prednisone for ten days.  She was seen by gastroenterology she is scheduled for a CT scan this Friday and an endoscopy at the end of the month to further investigate the cause of her symptoms. She also has an adrenal gland nodule that requires follow-up, and she plans to see Megan [name] for further evaluation.  She has a history of type 2 diabetes, which is currently well-controlled without medication. Her blood sugar levels are typically in the upper 90s to low 100s in the morning and around 110 in the afternoon. Her  A1c was 5.8% in August of last year.  She is currently taking topiramate 50 mg twice a day for weight management but is inconsistent with her medication adherence. She experiences occasional unsteadiness while walking, which she attributes to the medication. She often feels hungry and believes she eats due to stress.  She is very involved in her church community, which recently experienced a split, causing her significant stress. She has a strong connection to her church, where she and her family have been baptized and have had significant life events.        Challenges affecting patient progress:  unresolved GI problems affecting diet and ability to use AOM. Marland Kitchen    Pharmacotherapy for weight management: She is currently taking no anti-obesity medication.   Assessment and Plan   Treatment Plan For Obesity:  Recommended Dietary Goals  Megan [name] is currently in the action stage of change. As such, her goal is to continue weight management plan. She has agreed to: continue current plan, I will like for her to come in for 40-minute appointment so we could go over the principles of weight management the patient is relatively new to me and there are some opportunities there.  Behavioral Health and Counseling  We discussed the following behavioral modification strategies today: continue to work on maintaining a reduced calorie state, getting the recommended amount of protein, incorporating whole foods, making healthy choices, staying well hydrated and practicing mindfulness when eating..  Additional education and resources provided today: None  Recommended Physical Activity  Goals  Megan [name] has been advised to work up to 150 minutes of moderate intensity aerobic activity a week and strengthening exercises 2-3 times per week for cardiovascular health, weight loss maintenance and preservation of muscle mass.   She has agreed to :  Think about enjoyable ways to increase daily physical activity and  overcoming barriers to exercise and Increase physical activity in their day and reduce sedentary time (increase NEAT).  Pharmacotherapy  We discussed various medication options to help Megan [name] with her weight loss efforts and we both agreed to : continue with nutritional and behavioral strategies and because of gastrointestinal problems unresolved she is not a good candidate for GLP-1.  I am discontinuing topiramate because of her age and using zonisamide instead which has less CNS side effects.  Associated Conditions Impacted by Obesity Treatment  Abnormal food appetite Assessment & Plan: We will discontinue topiramate because of age and some CNS side effects she will be started on zonisamide 25 mg in the evening.  Orders: -     Zonisamide; Take 1 capsule (25 mg total) by mouth every evening.  Dispense: 30 capsule; Refill: 0  Type 2 diabetes mellitus with obesity (HCC) Assessment & Plan: Patient reports being diagnosed with type 2 diabetes based on an A1c of 6.5 this was nonconsecutive.  She is currently not on medications her most recent A1c was 5.8 we suggest adequate control.  She had been on GLP but medications were discontinued because of gastrointestinal side effects.  She still having some abdominal symptoms, which are longstanding and has been seen by gastroenterology.   Class 2 severe obesity with serious comorbidity and body mass index (BMI) of 39.0 to 39.9 in adult, unspecified obesity type [facility]) Assessment & Plan: Patient will be scheduled in 4 weeks for indirect calorimetry to assess metabolic rate we will then start from scratch and build a reduced calorie nutrition plan based on medical conditions and dietary preferences.  She is relatively new to me and I feel that we need to calibrate her nutritional strategy.  She will work on increasing protein to 30 g per meal in the short-term and not skipping meals.  Also work on increasing water intake.  Orders: -     Zonisamide; Take  1 capsule (25 mg total) by mouth every evening.  Dispense: 30 capsule; Refill: 0  Adrenal nodule (HCC) Assessment & Plan: Measuring 1.6 cm incidentally found on a CT scan in 2017.  I reviewed available records and do not see functional testing or further evaluation.  I was also trying to communicate with her PCP electronically but unable to do via epic messaging.  Considering comorbidities functional status should be assessed as she has hypertension and diabetes.   Periumbilical abdominal pain Assessment & Plan: Patient was recently seen by gastroenterology and is scheduled to have a CT scan of the abdomen and EGD.  We will follow-up on test results.     General Health Maintenance Last comprehensive blood work in August showed good cholesterol, B12, vitamin D levels, and an A1c of 5.8. Discussed need for follow-up blood work including A1c, cholesterol, kidney function, and functional testing for the adrenal nodule. - Ensure follow-up blood work includes A1c, cholesterol, kidney function, and functional testing for the adrenal nodule  Follow-up - Follow up with Megan [name] next week - Schedule a 40-minute reset appointment in four weeks - Arrive 30 minutes early for the metabolic rate test.          Objective  Physical Exam:  Blood pressure 126/80, pulse 61, temperature 97.6 F (36.4 C), height 4\' 11"  (1.499 m), weight 206 lb (93.4 kg), last menstrual period 09/20/2011, SpO2 100%. Body mass index is 41.61 kg/m.  General: She is overweight, cooperative, alert, well developed, and in no acute distress. PSYCH: Has normal mood, affect and thought process.   HEENT: EOMI, sclerae are anicteric. Lungs: Normal breathing effort, no conversational dyspnea. Extremities: No edema.  Neurologic: No gross sensory or motor deficits. No tremors or fasciculations noted.    Diagnostic Data Reviewed:  BMET    Component Value Date/Time   NA 139 01/18/2023 0919   K 3.8 01/18/2023 0919   CL  101 01/18/2023 0919   CO2 23 01/18/2023 0919   GLUCOSE 92 01/18/2023 0919   GLUCOSE 103 (H) 09/21/2015 1635   BUN 26 01/18/2023 0919   CREATININE 0.66 01/18/2023 0919   CALCIUM 9.9 01/18/2023 0919   GFRNONAA >60 09/21/2015 1635   GFRAA >60 09/21/2015 1635   Lab Results  Component Value Date   HGBA1C 5.8 (H) 01/18/2023   HGBA1C 6.4 (H) 08/09/2021   Lab Results  Component Value Date   INSULIN 17.2 01/18/2023   INSULIN 29.6 (H) 08/09/2021   Lab Results  Component Value Date   TSH 2.080 01/18/2023   CBC    Component Value Date/Time   WBC 7.0 01/18/2023 0919   WBC 7.6 09/21/2015 1635   RBC 5.31 (H) 01/18/2023 0919   RBC 5.11 09/29/2022 0000   HGB 14.9 01/18/2023 0919   HCT 46.0 01/18/2023 0919   PLT 240 01/18/2023 0919   MCV 87 01/18/2023 0919   MCH 28.1 01/18/2023 0919   MCH 27.2 09/21/2015 1635   MCHC 32.4 01/18/2023 0919   MCHC 32.3 09/21/2015 1635   RDW 14.6 01/18/2023 0919   Iron Studies No results found for: "IRON", "TIBC", "FERRITIN", "IRONPCTSAT" Lipid Panel     Component Value Date/Time   CHOL 153 01/18/2023 0919   TRIG 130 01/18/2023 0919   HDL 48 01/18/2023 0919   CHOLHDL 4.8 (H) 05/31/2022 0822   LDLCALC 82 01/18/2023 0919   Hepatic Function Panel     Component Value Date/Time   PROT 6.9 01/18/2023 0919   ALBUMIN 4.5 01/18/2023 0919   AST 16 01/18/2023 0919   ALT 20 01/18/2023 0919   ALKPHOS 87 01/18/2023 0919   BILITOT 0.3 01/18/2023 0919      Component Value Date/Time   TSH 2.080 01/18/2023 0919   Nutritional Lab Results  Component Value Date   VD25OH 75.1 01/18/2023   VD25OH 50.9 05/31/2022   VD25OH 58.8 12/14/2021    Medications: Outpatient Encounter Medications as of 07/25/2023  Medication Sig   blood glucose meter kit and supplies KIT Dispense based on patient and insurance preference. Use up to four times daily as directed.   Blood Glucose Monitoring Suppl DEVI 1 each by Does not apply route in the morning, at noon, and at  bedtime. May substitute to any manufacturer covered by patient's insurance.   budesonide-formoterol (SYMBICORT) 160-4.5 MCG/ACT inhaler Inhale 1 puff into the lungs daily.   Cholecalciferol (VITAMIN D3) 25 MCG (1000 UT) CAPS 1,000 IU every other day   hyoscyamine (ANASPAZ) 0.125 MG TBDP tablet Place under the tongue as needed.   ketoconazole (NIZORAL) 2 % shampoo APPLY TO SCALP LET SIT FOR 3-5 MINUTES AND THEN RINSE.   LOSARTAN POTASSIUM-HCTZ PO Take 25 mg by mouth daily at 12 noon.   mometasone (ELOCON) 0.1 % lotion Apply topically  two to three times weekly.   Multiple Vitamins-Minerals (MULTIVITAMIN PO) Take by mouth. Women over 40 vitamin   pantoprazole (PROTONIX) 40 MG tablet Take 40 mg by mouth daily as needed.   rosuvastatin (CRESTOR) 5 MG tablet Take 5 mg by mouth 2 (two) times a week.   zonisamide (ZONEGRAN) 25 MG capsule Take 1 capsule (25 mg total) by mouth every evening.   [DISCONTINUED] topiramate (TOPAMAX) 50 MG tablet 1 po BID   No facility-administered encounter medications on file as of 07/25/2023.     Follow-Up   Return in about 4 weeks (around 08/22/2023) for Fasting and 30 minutes early for IC, needs 40 minute appointment.. She was informed of the importance of frequent follow up visits to maximize her success with intensive lifestyle modifications for her multiple health conditions.  Attestation Statement   Reviewed by clinician on day of visit: allergies, medications, problem list, medical history, surgical history, family history, social history, and previous encounter notes.     Worthy Rancher, MD

## 2023-07-25 NOTE — Assessment & Plan Note (Signed)
 Patient reports being diagnosed with type 2 diabetes based on an A1c of 6.5 this was nonconsecutive.  She is currently not on medications her most recent A1c was 5.8 we suggest adequate control.  She had been on GLP but medications were discontinued because of gastrointestinal side effects.  She still having some abdominal symptoms, which are longstanding and has been seen by gastroenterology.

## 2023-07-25 NOTE — Assessment & Plan Note (Signed)
 We will discontinue topiramate because of age and some CNS side effects she will be started on zonisamide 25 mg in the evening.

## 2023-07-25 NOTE — Assessment & Plan Note (Signed)
 Measuring 1.6 cm incidentally found on a CT scan in 2017.  I reviewed available records and do not see functional testing or further evaluation.  I was also trying to communicate with her PCP electronically but unable to do via epic messaging.  Considering comorbidities functional status should be assessed as she has hypertension and diabetes.

## 2023-07-27 ENCOUNTER — Ambulatory Visit (HOSPITAL_BASED_OUTPATIENT_CLINIC_OR_DEPARTMENT_OTHER)
Admission: RE | Admit: 2023-07-27 | Discharge: 2023-07-27 | Disposition: A | Discharge status: Home / Self Care | Payer: Medicare Other | Source: Ambulatory Visit | Attending: Gastroenterology | Admitting: Gastroenterology

## 2023-07-27 DIAGNOSIS — R1013 Epigastric pain: Secondary | ICD-10-CM | POA: Insufficient documentation

## 2023-07-27 DIAGNOSIS — K7689 Other specified diseases of liver: Secondary | ICD-10-CM | POA: Diagnosis not present

## 2023-07-27 DIAGNOSIS — R935 Abnormal findings on diagnostic imaging of other abdominal regions, including retroperitoneum: Secondary | ICD-10-CM | POA: Diagnosis not present

## 2023-07-27 LAB — POCT I-STAT CREATININE: Creatinine, Ser: 0.7 mg/dL (ref 0.44–1.00)

## 2023-07-27 MED ORDER — IOHEXOL 300 MG/ML  SOLN
100.0000 mL | Freq: Once | INTRAMUSCULAR | Status: AC | PRN
  Administered 2023-07-27: 100 mL via INTRAVENOUS

## 2023-08-07 DIAGNOSIS — R7303 Prediabetes: Secondary | ICD-10-CM | POA: Diagnosis not present

## 2023-08-07 DIAGNOSIS — E782 Mixed hyperlipidemia: Secondary | ICD-10-CM | POA: Diagnosis not present

## 2023-08-14 DIAGNOSIS — K319 Disease of stomach and duodenum, unspecified: Secondary | ICD-10-CM | POA: Diagnosis not present

## 2023-08-14 DIAGNOSIS — R1032 Left lower quadrant pain: Secondary | ICD-10-CM | POA: Diagnosis not present

## 2023-08-14 DIAGNOSIS — K449 Diaphragmatic hernia without obstruction or gangrene: Secondary | ICD-10-CM | POA: Diagnosis not present

## 2023-08-14 DIAGNOSIS — R1013 Epigastric pain: Secondary | ICD-10-CM | POA: Diagnosis not present

## 2023-08-14 DIAGNOSIS — K219 Gastro-esophageal reflux disease without esophagitis: Secondary | ICD-10-CM | POA: Diagnosis not present

## 2023-08-15 DIAGNOSIS — E559 Vitamin D deficiency, unspecified: Secondary | ICD-10-CM | POA: Diagnosis not present

## 2023-08-15 DIAGNOSIS — J4520 Mild intermittent asthma, uncomplicated: Secondary | ICD-10-CM | POA: Diagnosis not present

## 2023-08-15 DIAGNOSIS — I10 Essential (primary) hypertension: Secondary | ICD-10-CM | POA: Diagnosis not present

## 2023-08-15 DIAGNOSIS — E279 Disorder of adrenal gland, unspecified: Secondary | ICD-10-CM | POA: Diagnosis not present

## 2023-08-15 DIAGNOSIS — K219 Gastro-esophageal reflux disease without esophagitis: Secondary | ICD-10-CM | POA: Diagnosis not present

## 2023-08-15 DIAGNOSIS — R21 Rash and other nonspecific skin eruption: Secondary | ICD-10-CM | POA: Diagnosis not present

## 2023-08-15 DIAGNOSIS — E782 Mixed hyperlipidemia: Secondary | ICD-10-CM | POA: Diagnosis not present

## 2023-08-15 DIAGNOSIS — R638 Other symptoms and signs concerning food and fluid intake: Secondary | ICD-10-CM | POA: Diagnosis not present

## 2023-08-15 DIAGNOSIS — R7303 Prediabetes: Secondary | ICD-10-CM | POA: Diagnosis not present

## 2023-08-15 DIAGNOSIS — R109 Unspecified abdominal pain: Secondary | ICD-10-CM | POA: Diagnosis not present

## 2023-08-27 DIAGNOSIS — R7303 Prediabetes: Secondary | ICD-10-CM | POA: Diagnosis not present

## 2023-08-27 DIAGNOSIS — I10 Essential (primary) hypertension: Secondary | ICD-10-CM | POA: Diagnosis not present

## 2023-08-27 DIAGNOSIS — D3502 Benign neoplasm of left adrenal gland: Secondary | ICD-10-CM | POA: Diagnosis not present

## 2023-08-29 ENCOUNTER — Encounter (INDEPENDENT_AMBULATORY_CARE_PROVIDER_SITE_OTHER): Payer: Self-pay | Admitting: Internal Medicine

## 2023-08-29 ENCOUNTER — Ambulatory Visit (INDEPENDENT_AMBULATORY_CARE_PROVIDER_SITE_OTHER): Admitting: Internal Medicine

## 2023-08-29 VITALS — BP 130/80 | HR 60 | Temp 98.0°F | Ht 59.0 in | Wt 208.0 lb

## 2023-08-29 DIAGNOSIS — E1169 Type 2 diabetes mellitus with other specified complication: Secondary | ICD-10-CM

## 2023-08-29 DIAGNOSIS — E1159 Type 2 diabetes mellitus with other circulatory complications: Secondary | ICD-10-CM | POA: Diagnosis not present

## 2023-08-29 DIAGNOSIS — Z6839 Body mass index (BMI) 39.0-39.9, adult: Secondary | ICD-10-CM

## 2023-08-29 DIAGNOSIS — R638 Other symptoms and signs concerning food and fluid intake: Secondary | ICD-10-CM | POA: Diagnosis not present

## 2023-08-29 DIAGNOSIS — E279 Disorder of adrenal gland, unspecified: Secondary | ICD-10-CM

## 2023-08-29 DIAGNOSIS — E66812 Obesity, class 2: Secondary | ICD-10-CM

## 2023-08-29 DIAGNOSIS — I152 Hypertension secondary to endocrine disorders: Secondary | ICD-10-CM | POA: Diagnosis not present

## 2023-08-29 MED ORDER — ZONISAMIDE 25 MG PO CAPS
50.0000 mg | ORAL_CAPSULE | Freq: Every evening | ORAL | 0 refills | Status: DC
Start: 2023-08-29 — End: 2023-10-04

## 2023-08-29 NOTE — Assessment & Plan Note (Addendum)
 She has increased orexigenic signaling, impaired satiety and inhibitory control. This is secondary to an abnormal energy regulation system and pathological neurohormonal pathways characteristic of excess adiposity.  In addition to nutritional and behavioral strategies she benefits from pharmacotherapy.  We will increase zonisamide to 50 mg in the evening.  She will also work on bulking up salads and measuring protein intake goal is for 30 to 40 g per meal for satiation and appetite suppression.

## 2023-08-29 NOTE — Assessment & Plan Note (Signed)
 Blood pressure is well-controlled she is currently on ARB hydrochlorothiazide combination.  She is experiencing some salt cravings likely due to thiazide this was explained.  She was counseled on maintaining adequate hydration.  Continue current regimen monitor for orthostasis while losing weight.

## 2023-08-29 NOTE — Assessment & Plan Note (Signed)
 She experiences difficulty with weight management despite adherence to a 1200 calorie meal plan 70% of the time and exercising six days a week. Reports hunger and inadequate fullness, potentially contributing to weight gain. Indirect calorimetry shows a resting energy expenditure of 1843 calories, indicating a paradoxically fast metabolic rate. Currently on Zonisamide 25 mg for appetite suppression without significant change in appetite or fullness. Decision to increase zonisamide is based on lack of appetite suppression at the current dose. If no changes are observed at 50 mg, discontinuation will be considered. - Increase zonisamide to 50 mg in the evening for appetite suppression - Monitor for changes or side effects at the increased dose - Educate on bulking up meals with more vegetables and protein to increase fullness - Provide a handout on the benefits of protein in weight management - Reassess in four weeks

## 2023-08-29 NOTE — Progress Notes (Signed)
 Office: [phone]  /  Fax: [phone]  Weight Summary And Biometrics  Vitals Temp: 98 F (36.7 C) BP: 130/80 Pulse Rate: 60 SpO2: 100 %   Anthropometric Measurements Height: 4\' 11"  (1.499 m) Weight: 208 lb (94.3 kg) BMI (Calculated): 41.99 Weight at Last Visit: 206 lb Weight Lost Since Last Visit: 0 lb Weight Gained Since Last Visit: 2 lb Starting Weight: 224 lb Total Weight Loss (lbs): 16 lb (7.258 kg) Peak Weight: 234 lb   Body Composition  Body Fat %: 49.6 % Fat Mass (lbs): 103.4 lbs Muscle Mass (lbs): 99.8 lbs Total Body Water (lbs): 80 lbs Visceral Fat Rating : 18    RMR: 1843  Today's Visit #: no  Starting Date: 08/09/21   Subjective   Chief Complaint: Obesity  Interval History Discussed the use of AI scribe software for clinical note transcription with the patient, who gave verbal consent to proceed.  History of Present Illness REMMY CRASS "[name]" is a 73 year old female with hypertension and type 2 diabetes who presents for medical weight management. She was referred by her primary care team for further evaluation.  She is experiencing difficulty losing weight despite adhering to a 1200 calorie meal plan approximately 70% of the time. She incorporates more whole foods but does not meet her recommended protein intake. She maintains adequate hydration and does not skip meals. She exercises six days a week, about 45 minutes per session, primarily cardio, and reports inadequate sleep but no high levels of stress.  She was started on senesemide 25 mg in the evening for appetite suppression during her last office visit. Despite this, she has gained two pounds and reports no change in appetite or sense of fullness.   She underwent indirect calorimetry this morning, which showed a resting energy expenditure of 1843 calories, an increase from 1771 calories in March 2023. Her basal metabolic rate is estimated at 2956 calories. Despite a faster  metabolic rate, she is gaining weight.  She has a left adrenal adenoma, first identified in a CT scan in 2014, which remains stable at 1.5 cm as of a CT scan in March 2025. She is undergoing further hormonal testing due to her diabetes and hypertension.  She reports abdominal pain after meals, for which she is seeing gastroenterology. No nausea, vomiting, or constipation.  She has a history of atherosclerotic cardiovascular disease and is not on a GLP-1 receptor agonist for her weight management and diabetes due to gastrointestinal issues. She previously tried metformin, which caused severe gastrointestinal side effects, and Mounjaro, which led to loose bowels at higher doses.  She has hypertension and is currently on losartan hydrochlorothiazide. She experiences salt cravings, which may be related to the diuretic effect of her medication.    Challenges affecting patient progress: strong hunger signals and/or impaired satiety / inhibitory control, having difficulty with meal prep and planning, and having difficulty focusing on healthy eating.    Pharmacotherapy for weight management: She is currently taking Zonisamide (off label use, single agent) with adequate clinical response  and without side effects..   Assessment and Plan   Treatment Plan For Obesity:  Recommended Dietary Goals  Nidia is currently in the action stage of change. As such, her goal is to continue weight management plan. She has agreed to: follow the Category 2 plan - 1200 kcal per day  Behavioral Health and Counseling  We discussed the following behavioral modification strategies today: increasing lean protein intake to established goals, increasing vegetables, increasing  fiber rich foods, and increasing water intake .  Additional education and resources provided today: Handout on recognition of cravings vs hunger and handout on the benefits of protein and weight management  Recommended Physical Activity  Goals  Terrah has been advised to work up to 150 minutes of moderate intensity aerobic activity a week and strengthening exercises 2-3 times per week for cardiovascular health, weight loss maintenance and preservation of muscle mass.   She has agreed to :  continue to gradually increase the amount and intensity of exercise routine  Pharmacotherapy  We discussed various medication options to help Jareli with her weight loss efforts and we both agreed to : increase zonisamide to 50 mg in the evening if no clinical response at this dose medication will be discontinued.  We will consider low-dose Mounjaro next as her A1c is trending upward and it seems that she tolerated the medication well in the past but experienced side effects to higher dosages.  Associated Conditions Impacted by Obesity Treatment  Hypertension associated with type 2 diabetes mellitus (HCC) Assessment & Plan: Blood pressure is well-controlled she is currently on ARB hydrochlorothiazide combination.  She is experiencing some salt cravings likely due to thiazide this was explained.  She was counseled on maintaining adequate hydration.  Continue current regimen monitor for orthostasis while losing weight.   Abnormal food appetite Assessment & Plan: She has increased orexigenic signaling, impaired satiety and inhibitory control. This is secondary to an abnormal energy regulation system and pathological neurohormonal pathways characteristic of excess adiposity.  In addition to nutritional and behavioral strategies she benefits from pharmacotherapy.  We will increase zonisamide to 50 mg in the evening.  She will also work on bulking up salads and measuring protein intake goal is for 30 to 40 g per meal for satiation and appetite suppression.   Orders: -     Zonisamide; Take 2 capsules (50 mg total) by mouth every evening.  Dispense: 60 capsule; Refill: 0  Class 2 severe obesity with serious comorbidity and body mass index (BMI)  of 39.0 to 39.9 in adult, unspecified obesity type [facility]) Assessment & Plan: She experiences difficulty with weight management despite adherence to a 1200 calorie meal plan 70% of the time and exercising six days a week. Reports hunger and inadequate fullness, potentially contributing to weight gain. Indirect calorimetry shows a resting energy expenditure of 1843 calories, indicating a paradoxically fast metabolic rate. Currently on Zonisamide 25 mg for appetite suppression without significant change in appetite or fullness. Decision to increase zonisamide is based on lack of appetite suppression at the current dose. If no changes are observed at 50 mg, discontinuation will be considered. - Increase zonisamide to 50 mg in the evening for appetite suppression - Monitor for changes or side effects at the increased dose - Educate on bulking up meals with more vegetables and protein to increase fullness - Provide a handout on the benefits of protein in weight management - Reassess in four weeks  Orders: -     Zonisamide; Take 2 capsules (50 mg total) by mouth every evening.  Dispense: 60 capsule; Refill: 0  Type 2 diabetes mellitus with obesity (HCC) Assessment & Plan: Per A1c increased to 6.2 from 5.8, indicating progression of her diabetes.  She had an A1c of 6.5 and 6.4 previously.  Intolerance to metformin and adverse effects from [facility] at higher doses. Current plan is to manage diabetes through weight management and dietary modifications. Consider revisiting Mounjaro at a low dose if  appetite suppression is not achieved with  zonisamide, as some patients experience effective appetite suppression and weight loss at lower doses without adverse effects. - Monitor A1c levels - Consider revisiting Mounjaro at a low dose if appetite suppression is not achieved with senesemideShe   Adrenal nodule (HCC) Assessment & Plan: She has a 1.5 cm left adrenal adenoma, incidentally found in 2014 and  unchanged. Undergoing further hormonal testing to assess for excess hormone production that could explain her fast metabolic rate and weight management challenges. - Continue with hormonal testing as planned by endocrinology - Reviewed recent consultation and CT scan -Follow-up on test results at the next visit -She seems to have of an elevated metabolic rate on indirect calorimetry so would like to see her catecholamine levels as she also has a history of hypertension.           Objective   Physical Exam:  Blood pressure 130/80, pulse 60, temperature 98 F (36.7 C), height 4\' 11"  (1.499 m), weight 208 lb (94.3 kg), last menstrual period 09/20/2011, SpO2 100%. Body mass index is 42.01 kg/m.  General: She is overweight, cooperative, alert, well developed, and in no acute distress. PSYCH: Has normal mood, affect and thought process.   HEENT: EOMI, sclerae are anicteric. Lungs: Normal breathing effort, no conversational dyspnea. Extremities: No edema.  Neurologic: No gross sensory or motor deficits. No tremors or fasciculations noted.    Diagnostic Data Reviewed:  BMET    Component Value Date/Time   NA 139 01/18/2023 0919   K 3.8 01/18/2023 0919   CL 101 01/18/2023 0919   CO2 23 01/18/2023 0919   GLUCOSE 92 01/18/2023 0919   GLUCOSE 103 (H) 09/21/2015 1635   BUN 26 01/18/2023 0919   CREATININE 0.70 07/27/2023 0857   CALCIUM 9.9 01/18/2023 0919   GFRNONAA >60 09/21/2015 1635   GFRAA >60 09/21/2015 1635   Lab Results  Component Value Date   HGBA1C 5.8 (H) 01/18/2023   HGBA1C 6.4 (H) 08/09/2021   Lab Results  Component Value Date   INSULIN 17.2 01/18/2023   INSULIN 29.6 (H) 08/09/2021   Lab Results  Component Value Date   TSH 2.080 01/18/2023   CBC    Component Value Date/Time   WBC 7.0 01/18/2023 0919   WBC 7.6 09/21/2015 1635   RBC 5.31 (H) 01/18/2023 0919   RBC 5.11 09/29/2022 0000   HGB 14.9 01/18/2023 0919   HCT 46.0 01/18/2023 0919   PLT 240  01/18/2023 0919   MCV 87 01/18/2023 0919   MCH 28.1 01/18/2023 0919   MCH 27.2 09/21/2015 1635   MCHC 32.4 01/18/2023 0919   MCHC 32.3 09/21/2015 1635   RDW 14.6 01/18/2023 0919   Iron Studies No results found for: "IRON", "TIBC", "FERRITIN", "IRONPCTSAT" Lipid Panel     Component Value Date/Time   CHOL 153 01/18/2023 0919   TRIG 130 01/18/2023 0919   HDL 48 01/18/2023 0919   CHOLHDL 4.8 (H) 05/31/2022 0822   LDLCALC 82 01/18/2023 0919   Hepatic Function Panel     Component Value Date/Time   PROT 6.9 01/18/2023 0919   ALBUMIN 4.5 01/18/2023 0919   AST 16 01/18/2023 0919   ALT 20 01/18/2023 0919   ALKPHOS 87 01/18/2023 0919   BILITOT 0.3 01/18/2023 0919      Component Value Date/Time   TSH 2.080 01/18/2023 0919   Nutritional Lab Results  Component Value Date   VD25OH 75.1 01/18/2023   VD25OH 50.9 05/31/2022   VD25OH 58.8 12/14/2021  Medications: Outpatient Encounter Medications as of 08/29/2023  Medication Sig   blood glucose meter kit and supplies KIT Dispense based on patient and insurance preference. Use up to four times daily as directed.   Blood Glucose Monitoring Suppl DEVI 1 each by Does not apply route in the morning, at noon, and at bedtime. May substitute to any manufacturer covered by patient's insurance.   budesonide-formoterol (SYMBICORT) 160-4.5 MCG/ACT inhaler Inhale 1 puff into the lungs daily.   Cholecalciferol (VITAMIN D3) 25 MCG (1000 UT) CAPS 1,000 IU every other day   hyoscyamine (ANASPAZ) 0.125 MG TBDP tablet Place under the tongue as needed.   ketoconazole (NIZORAL) 2 % shampoo APPLY TO SCALP LET SIT FOR 3-5 MINUTES AND THEN RINSE.   LOSARTAN POTASSIUM-HCTZ PO Take 25 mg by mouth daily at 12 noon.   mometasone (ELOCON) 0.1 % lotion Apply topically two to three times weekly.   Multiple Vitamins-Minerals (MULTIVITAMIN PO) Take by mouth. Women over 40 vitamin   pantoprazole (PROTONIX) 40 MG tablet Take 40 mg by mouth daily as needed.    rosuvastatin (CRESTOR) 5 MG tablet Take 5 mg by mouth 2 (two) times a week.   [DISCONTINUED] zonisamide (ZONEGRAN) 25 MG capsule Take 1 capsule (25 mg total) by mouth every evening.   zonisamide (ZONEGRAN) 25 MG capsule Take 2 capsules (50 mg total) by mouth every evening.   No facility-administered encounter medications on file as of 08/29/2023.     Follow-Up   Return in about 4 weeks (around 09/26/2023) for For Weight Mangement with Dr. Rikki Spearing.Marland Kitchen She was informed of the importance of frequent follow up visits to maximize her success with intensive lifestyle modifications for her multiple health conditions.  Attestation Statement   Reviewed by clinician on day of visit: allergies, medications, problem list, medical history, surgical history, family history, social history, and previous encounter notes.   I have spent 45 minutes in the care of the patient today including: 5 minutes before the visit reviewing the chart 31 minutes face-to-face assessing and reviewing listed medical problems as outlined in obesity care plan, providing nutritional and behavioral counseling as outlined in obesity care plan, counseling regarding anti-obesity medication as outlined in obesity care plan, independently interpreting results and goals of care, see listed medical problems, discussing biometric information and progress, and reviewing consultation from endocrinology and CT scan results in Care Everywhere 9 minutes after the visit on documentation    Worthy Rancher, MD

## 2023-08-29 NOTE — Assessment & Plan Note (Signed)
 She has a 1.5 cm left adrenal adenoma, incidentally found in 2014 and unchanged. Undergoing further hormonal testing to assess for excess hormone production that could explain her fast metabolic rate and weight management challenges. - Continue with hormonal testing as planned by endocrinology - Reviewed recent consultation and CT scan -Follow-up on test results at the next visit -She seems to have of an elevated metabolic rate on indirect calorimetry so would like to see her catecholamine levels as she also has a history of hypertension.

## 2023-08-29 NOTE — Assessment & Plan Note (Signed)
 Per A1c increased to 6.2 from 5.8, indicating progression of her diabetes.  She had an A1c of 6.5 and 6.4 previously.  Intolerance to metformin and adverse effects from [facility] at higher doses. Current plan is to manage diabetes through weight management and dietary modifications. Consider revisiting Mounjaro at a low dose if appetite suppression is not achieved with  zonisamide, as some patients experience effective appetite suppression and weight loss at lower doses without adverse effects. - Monitor A1c levels - Consider revisiting Mounjaro at a low dose if appetite suppression is not achieved with senesemideShe

## 2023-09-02 DIAGNOSIS — D3502 Benign neoplasm of left adrenal gland: Secondary | ICD-10-CM | POA: Diagnosis not present

## 2023-09-05 DIAGNOSIS — D3502 Benign neoplasm of left adrenal gland: Secondary | ICD-10-CM | POA: Diagnosis not present

## 2023-09-12 DIAGNOSIS — K219 Gastro-esophageal reflux disease without esophagitis: Secondary | ICD-10-CM | POA: Diagnosis not present

## 2023-09-12 DIAGNOSIS — R1013 Epigastric pain: Secondary | ICD-10-CM | POA: Diagnosis not present

## 2023-10-03 ENCOUNTER — Other Ambulatory Visit (INDEPENDENT_AMBULATORY_CARE_PROVIDER_SITE_OTHER): Payer: Self-pay | Admitting: Internal Medicine

## 2023-10-03 DIAGNOSIS — R638 Other symptoms and signs concerning food and fluid intake: Secondary | ICD-10-CM

## 2023-10-03 DIAGNOSIS — E66812 Obesity, class 2: Secondary | ICD-10-CM

## 2023-10-04 ENCOUNTER — Other Ambulatory Visit (INDEPENDENT_AMBULATORY_CARE_PROVIDER_SITE_OTHER): Payer: Self-pay | Admitting: Internal Medicine

## 2023-10-04 ENCOUNTER — Other Ambulatory Visit: Payer: Self-pay | Admitting: Bariatrics

## 2023-10-04 ENCOUNTER — Encounter: Payer: Self-pay | Admitting: Bariatrics

## 2023-10-04 DIAGNOSIS — R638 Other symptoms and signs concerning food and fluid intake: Secondary | ICD-10-CM

## 2023-10-04 DIAGNOSIS — E66812 Obesity, class 2: Secondary | ICD-10-CM

## 2023-10-04 MED ORDER — ZONISAMIDE 25 MG PO CAPS
50.0000 mg | ORAL_CAPSULE | Freq: Every evening | ORAL | 0 refills | Status: AC
Start: 2023-10-04 — End: ?

## 2023-10-04 NOTE — Telephone Encounter (Signed)
 LAST APPOINTMENT DATE: 08/29/23 NEXT APPOINTMENT DATE: 10/08/23 (patient will run out of meds before this date)   [id number] [place], [place] - [address] [place] [address] Phone: [phone] Fax: [phone]  Patient is requesting a refill of the following medications: Requested Prescriptions   Pending Prescriptions Disp Refills   zonisamide  (ZONEGRAN ) 25 MG capsule 60 capsule 0    Sig: Take 2 capsules (50 mg total) by mouth every evening.    Date last filled: 08/29/23 Previously prescribed by Aletha Hutching, MD  Lab Results  Component Value Date   HGBA1C 5.8 (H) 01/18/2023   HGBA1C 5.9 09/29/2022   HGBA1C 6.5 (H) 05/31/2022   Lab Results  Component Value Date   LDLCALC 82 01/18/2023   CREATININE 0.70 07/27/2023   Lab Results  Component Value Date   VD25OH 75.1 01/18/2023   VD25OH 50.9 05/31/2022   VD25OH 58.8 12/14/2021    BP Readings from Last 3 Encounters:  08/29/23 130/80  07/25/23 126/80  06/26/23 130/80

## 2023-10-08 ENCOUNTER — Ambulatory Visit (INDEPENDENT_AMBULATORY_CARE_PROVIDER_SITE_OTHER): Admitting: Family Medicine

## 2023-10-08 ENCOUNTER — Encounter (INDEPENDENT_AMBULATORY_CARE_PROVIDER_SITE_OTHER): Payer: Self-pay | Admitting: Family Medicine

## 2023-10-08 VITALS — BP 159/70 | HR 51 | Temp 98.3°F | Ht 59.0 in | Wt 210.0 lb

## 2023-10-08 DIAGNOSIS — E1159 Type 2 diabetes mellitus with other circulatory complications: Secondary | ICD-10-CM

## 2023-10-08 DIAGNOSIS — Z6841 Body Mass Index (BMI) 40.0 and over, adult: Secondary | ICD-10-CM | POA: Diagnosis not present

## 2023-10-08 DIAGNOSIS — I152 Hypertension secondary to endocrine disorders: Secondary | ICD-10-CM | POA: Diagnosis not present

## 2023-10-08 NOTE — Assessment & Plan Note (Signed)
 Blood pressure elevated today.  No chest pain, chest pressure or headache.  She is on losartan hydrochlorothiazide daily.  Does not need a refill today.  Prior blood pressures well controlled so will not change medications today but will follow up at next appointment to ensure control at next appointment.

## 2023-10-08 NOTE — Progress Notes (Unsigned)
 SUBJECTIVE:  Chief Complaint: Obesity  Interim History: First two weeks she felt she did relatively ok with sticking with her food choices and meal plan.  She has logged consistently over the last few weeks and stayed under 1200 calories and has exercised more than she did previously.  She has been going to the [facility] and is walking in addition to that.  She is getting around 1180 in terms of calories but isn't sure if her food logging app is measuring protein amount.   Megan [name] is here to discuss her progress with her obesity treatment plan. She is on the Category 2 Plan and keeping a food journal and adhering to recommended goals of 1200 calories and states she is following her eating plan approximately 70 % of the time. She states she is exercising 4,000-5,500 steps 3 times per week.   OBJECTIVE: Visit Diagnoses: Problem List Items Addressed This Visit       Cardiovascular and Mediastinum   Hypertension associated with type 2 diabetes mellitus (HCC) - Primary   Blood pressure elevated today.  No chest pain, chest pressure or headache.  She is on losartan hydrochlorothiazide daily.  Does not need a refill today.  Prior blood pressures well controlled so will not change medications today but will follow up at next appointment to ensure control at next appointment.       Vitals Temp: 98.3 F (36.8 C) BP: (!) 159/70 Pulse Rate: (!) 51 SpO2: 97 %   Anthropometric Measurements Height: 4\' 11"  (1.499 m) Weight: 210 lb (95.3 kg) BMI (Calculated): 42.39 Weight at Last Visit: 206 lb Weight Lost Since Last Visit: 0 Weight Gained Since Last Visit: 2 Starting Weight: 224 lb Total Weight Loss (lbs): 14 lb (6.35 kg) Peak Weight: 234 lb   Body Composition  Body Fat %: 51.3 % Fat Mass (lbs): 107.8 lbs Muscle Mass (lbs): 97 lbs Visceral Fat Rating : 18   Other Clinical Data Today's Visit #: 34 Starting Date: 08/09/21 Comments: Cat 2, 1200     ASSESSMENT AND  PLAN:  Diet: [name] is currently in the action stage of change. As such, her goal is to continue with weight loss efforts and has agreed to keeping a food journal and adhering to recommended goals of 1200 calories and 90 or more grams protein daily.  Patient to start food log or journaling meal plan.  The initial goal will be to habitually log or journal for at least 4 days a week.  The expectation it that patient may not initially meet calorie or protein goals as the nutritional understanding of food intake is begun.  We discussed the 10:1 ratio when reading a food label.  Patient agrees to keep a food log either electronically or on paper and bring to the next appointment to be able to dissect and discuss it with provider.    Exercise:  Older adults should determine their level of effort for physical activity relative to their level of fitness.  Behavior Modification:  We discussed the following Behavioral Modification Strategies today: increasing lean protein intake, decreasing simple carbohydrates, increasing vegetables, meal planning and cooking strategies, and keep a strict food journal.   Return in about 4 weeks (around 11/05/2023).   She was informed of the importance of frequent follow up visits to maximize her success with intensive lifestyle modifications for her multiple health conditions.  Attestation Statements:   Reviewed by clinician on day of visit: allergies, medications, problem list, medical history, surgical history, family history,  social history, and previous encounter notes.     Donaciano Frizzle, MD

## 2023-10-15 NOTE — Assessment & Plan Note (Signed)
 Anthropometric Measurements Height: 4\' 11"  (1.499 m) Weight: 210 lb (95.3 kg) BMI (Calculated): 42.39 Weight at Last Visit: 206 lb Weight Lost Since Last Visit: 0 Weight Gained Since Last Visit: 2 Starting Weight: 224 lb Total Weight Loss (lbs): 14 lb (6.35 kg) Peak Weight: 234 lb Body Composition  Body Fat %: 51.3 % Fat Mass (lbs): 107.8 lbs Muscle Mass (lbs): 97 lbs Visceral Fat Rating : 18 Other Clinical Data Today's Visit #: 34 Starting Date: 08/09/21 Comments: Cat 2, 1200

## 2023-11-06 ENCOUNTER — Ambulatory Visit (INDEPENDENT_AMBULATORY_CARE_PROVIDER_SITE_OTHER): Admitting: Internal Medicine

## 2023-11-06 ENCOUNTER — Encounter (INDEPENDENT_AMBULATORY_CARE_PROVIDER_SITE_OTHER): Payer: Self-pay | Admitting: Internal Medicine

## 2023-11-06 VITALS — BP 144/79 | HR 56 | Temp 98.6°F | Ht 59.0 in | Wt 206.0 lb

## 2023-11-06 DIAGNOSIS — Z6839 Body mass index (BMI) 39.0-39.9, adult: Secondary | ICD-10-CM

## 2023-11-06 DIAGNOSIS — E1169 Type 2 diabetes mellitus with other specified complication: Secondary | ICD-10-CM

## 2023-11-06 DIAGNOSIS — E1159 Type 2 diabetes mellitus with other circulatory complications: Secondary | ICD-10-CM | POA: Diagnosis not present

## 2023-11-06 DIAGNOSIS — R638 Other symptoms and signs concerning food and fluid intake: Secondary | ICD-10-CM

## 2023-11-06 DIAGNOSIS — E66812 Obesity, class 2: Secondary | ICD-10-CM

## 2023-11-06 DIAGNOSIS — I152 Hypertension secondary to endocrine disorders: Secondary | ICD-10-CM | POA: Diagnosis not present

## 2023-11-06 MED ORDER — ZONISAMIDE 25 MG PO CAPS: 50.0000 mg | ORAL_CAPSULE | Freq: Every evening | ORAL | 0 refills | Status: DC

## 2023-11-06 NOTE — Progress Notes (Signed)
 Office: [phone]  /  Fax: [phone]  Weight Summary And Biometrics  Vitals Temp: 98.6 F (37 C) BP: (!) 144/79 Pulse Rate: (!) 56 SpO2: 96 %   Anthropometric Measurements Height: 4' 11 (1.499 m) Weight: 206 lb (93.4 kg) BMI (Calculated): 41.58 Weight at Last Visit: 210 lb Weight Lost Since Last Visit: 4 lb Weight Gained Since Last Visit: 0 lb Starting Weight: 224 lb Total Weight Loss (lbs): 18 lb (8.165 kg) Peak Weight: 234 lb   Body Composition  Body Fat %: 48.6 % Fat Mass (lbs): 100.2 lbs Muscle Mass (lbs): 100.6 lbs Total Body Water (lbs): 79.6 lbs Visceral Fat Rating : 17    No data recorded Today's Visit #: 35  Starting Date: 08/09/21   Subjective   Chief Complaint: Obesity  Interval History Discussed the use of AI scribe software for clinical note transcription with the patient, who gave verbal consent to proceed.  History of Present Illness   Megan [name] is a 73 year old female who presents for medical weight management.  Since her last visit, she has lost four pounds by following a 1200 calorie nutrition plan approximately 70-80% of the time, tracking her calories, and consuming more whole foods. She maintains adequate hydration and denies skipping meals but does not exercise. She experiences inadequate sleep and some stress.  She uses the My Fitness Plan application to log her food intake. Despite keeping her sugar intake low and staying within her calorie range, her fat intake is higher than desired, possibly due to protein sources like eggs and cheese. She feels hungry, particularly after breakfast, which typically consists of one egg, [place] sausage, and [place] 45 grain bread. She finds her lunch of ground chicken, rice, and vegetables more filling. She acknowledges the importance of water, fiber, and protein in achieving fullness.  She has experienced an increase in blood pressure, with a recent reading of 197/101 mmHg. She  had stopped taking her blood pressure medication, hydrochlorothiazide, due to severe leg cramps. She resumed the medication but continues to experience cramps despite adequate hydration and electrolyte intake.  She takes senesemide in the evening, which she finds effective when taken at 5 PM. She reports occasional balance issues during the day, which her husband also experiences. She feels the medication may subtly help with appetite suppression.  She has lost 18 pounds overall, attributing her weight loss to logging her food intake and managing her diet. She aims to increase her breakfast intake to prevent hunger and considers adding low-calorie yogurt or Greek yogurt to her morning routine.  She reports achieving 8,000 steps on average, with a minimum of 4,600 steps on her lowest day last week.       Challenges affecting patient progress: strong hunger signals and/or impaired satiety / inhibitory control, low volume of physical activity at present , medical comorbidities, and menopause.    Pharmacotherapy for weight management: She is currently taking Zonisamide  (off label use, single agent) with adequate clinical response  and experiencing the following side effects: Problems with balance..   Assessment and Plan   Treatment Plan For Obesity:  Recommended Dietary Goals  Megan [name] is currently in the action stage of change. As such, her goal is to continue weight management plan. She has agreed to: continue current plan  Behavioral Health and Counseling  We discussed the following behavioral modification strategies today: continue to work on maintaining a reduced calorie state, getting the recommended amount of protein, incorporating whole foods, making  healthy choices, staying well hydrated and practicing mindfulness when eating..  Counseled on strategies to stay on track while traveling she is going on a cruise.  Additional education and resources provided today: None  Recommended  Physical Activity Goals  Megan [name] has been advised to work up to 150 minutes of moderate intensity aerobic activity a week and strengthening exercises 2-3 times per week for cardiovascular health, weight loss maintenance and preservation of muscle mass.   She has agreed to :  Think about enjoyable ways to increase daily physical activity and overcoming barriers to exercise and Increase physical activity in their day and reduce sedentary time (increase NEAT).  Pharmacotherapy  We discussed various medication options to help Megan [name] with her weight loss efforts and we both agreed to : I will like for her to continue zonisamide  for the next 4 weeks we discussed today the benefits and side effects of semaglutide considering age, comorbidities and side effects related to zonisamide  I think semaglutide would be a better option.  This will help with her diabetes, help improve obesity related complications and reduce the risk of side effects with antiobesity medications.  Associated Conditions Impacted by Obesity Treatment  Hypertension associated with type 2 diabetes mellitus (HCC)  Abnormal food appetite -     Zonisamide ; Take 2 capsules (50 mg total) by mouth every evening.  Dispense: 60 capsule; Refill: 0  Class 2 severe obesity with serious comorbidity and body mass index (BMI) of 39.0 to 39.9 in adult, unspecified obesity type (HCC) -     Zonisamide ; Take 2 capsules (50 mg total) by mouth every evening.  Dispense: 60 capsule; Refill: 0  Type 2 diabetes mellitus with obesity (HCC)     Assessment and Plan    Hypertension Reports elevated blood pressure readings, with a recent measurement of 197/101 mmHg. Discontinued hydrochlorothiazide due to severe leg cramps. Current blood pressure is 144/79 mmHg. Alternative antihypertensive medications should be discussed with her primary care provider due to intolerance to hydrochlorothiazide. - Discuss alternative antihypertensive medications with  primary care provider, her cramps are likely associated with hydrochlorothiazide.  Obesity/abnormal food appetite Lost four pounds since the last visit, bring her to a total of 18 pounds. Adhering to a 1200 calorie nutrition plan 70-80% of the time. Tracking calories and consuming more whole foods but may not meet recommended protein intake. Reports hunger, especially post-breakfast, and is not exercising. Discussed monitoring saturated fat intake and increasing healthy fats, fiber, and protein to enhance satiety. dvised to increase physical activity to compensate for age-related metabolic changes. Emphasized staying active during her upcoming cruise, including walking and using stairs instead of elevators. - Continue 1200 calorie nutrition plan - Increase intake of healthy fats, fiber, and protein - Monitor saturated fat intake - Increase physical activity, such as walking - Consider protein smoothies with unsweetened almond milk and whey isolate - Add vegetables to breakfast meals - Stay active during the cruise by walking and using stairs  Type 2 diabetes HgbA1c is at goal for age and comorbid conditions.  Request records for most recent A1c based on available records it was 5.8 back in August.  She is currently off medications have been on Mounjaro  in the past but the medication was discontinued due to loose stools.  I think that patient would benefit from GLP-1 therapy considering comorbidities.  We will likely transition her to GLP-1 once she comes back from her cruise.  Counseled on goals of care, monitoring for complications and importance of staying updated  on immunizations and diabetes preventive measures. Continue with reduced calorie meal plan low on processed crabs and simple sugars. Ongoing weight loss will improve insulin  resistance and glycemic control  Lab Results  Component Value Date   HGBA1C 5.8 (H) 01/18/2023   HGBA1C 5.9 09/29/2022   HGBA1C 6.5 (H) 05/31/2022   Lab  Results  Component Value Date   LDLCALC 82 01/18/2023   CREATININE 0.70 07/27/2023      Medication Side Effects Experiencing balance issues, potentially related to evening medication senesemide. Reports feeling off balance during the day but denies dizziness or lightheadedness. Concern about fall risk. Consider switching to GLP-1 medication, semaglutide, after her upcoming cruise. Semaglutide may aid in appetite suppression, weight loss, and potentially lower blood pressure by about ten points, as well as reduce heart attack risk. - Continue zonisamide  until after the cruise - Plan to switch to semaglutide (Ozempic) after the cruise if balance issues persist        Objective   Physical Exam:  Blood pressure (!) 144/79, pulse (!) 56, temperature 98.6 F (37 C), height 4' 11 (1.499 m), weight 206 lb (93.4 kg), last menstrual period 09/20/2011, SpO2 96%. Body mass index is 41.61 kg/m.  General: She is overweight, cooperative, alert, well developed, and in no acute distress. PSYCH: Has normal mood, affect and thought process.   HEENT: EOMI, sclerae are anicteric. Lungs: Normal breathing effort, no conversational dyspnea. Extremities: No edema.  Neurologic: No gross sensory or motor deficits. No tremors or fasciculations noted.    Diagnostic Data Reviewed:  BMET    Component Value Date/Time   NA 139 01/18/2023 0919   K 3.8 01/18/2023 0919   CL 101 01/18/2023 0919   CO2 23 01/18/2023 0919   GLUCOSE 92 01/18/2023 0919   GLUCOSE 103 (H) 09/21/2015 1635   BUN 26 01/18/2023 0919   CREATININE 0.70 07/27/2023 0857   CALCIUM 9.9 01/18/2023 0919   GFRNONAA >60 09/21/2015 1635   GFRAA >60 09/21/2015 1635   Lab Results  Component Value Date   HGBA1C 5.8 (H) 01/18/2023   HGBA1C 6.4 (H) 08/09/2021   Lab Results  Component Value Date   INSULIN  17.2 01/18/2023   INSULIN  29.6 (H) 08/09/2021   Lab Results  Component Value Date   TSH 2.080 01/18/2023   CBC    Component  Value Date/Time   WBC 7.0 01/18/2023 0919   WBC 7.6 09/21/2015 1635   RBC 5.31 (H) 01/18/2023 0919   RBC 5.11 09/29/2022 0000   HGB 14.9 01/18/2023 0919   HCT 46.0 01/18/2023 0919   PLT 240 01/18/2023 0919   MCV 87 01/18/2023 0919   MCH 28.1 01/18/2023 0919   MCH 27.2 09/21/2015 1635   MCHC 32.4 01/18/2023 0919   MCHC 32.3 09/21/2015 1635   RDW 14.6 01/18/2023 0919   Iron Studies No results found for: IRON, TIBC, FERRITIN, IRONPCTSAT Lipid Panel     Component Value Date/Time   CHOL 153 01/18/2023 0919   TRIG 130 01/18/2023 0919   HDL 48 01/18/2023 0919   CHOLHDL 4.8 (H) 05/31/2022 0822   LDLCALC 82 01/18/2023 0919   Hepatic Function Panel     Component Value Date/Time   PROT 6.9 01/18/2023 0919   ALBUMIN 4.5 01/18/2023 0919   AST 16 01/18/2023 0919   ALT 20 01/18/2023 0919   ALKPHOS 87 01/18/2023 0919   BILITOT 0.3 01/18/2023 0919      Component Value Date/Time   TSH 2.080 01/18/2023 0919   Nutritional Lab Results  Component Value Date   VD25OH 75.1 01/18/2023   VD25OH 50.9 05/31/2022   VD25OH 58.8 12/14/2021    Medications: Outpatient Encounter Medications as of 11/06/2023  Medication Sig   blood glucose meter kit and supplies KIT Dispense based on patient and insurance preference. Use up to four times daily as directed.   Blood Glucose Monitoring Suppl DEVI 1 each by Does not apply route in the morning, at noon, and at bedtime. May substitute to any manufacturer covered by patient's insurance.   budesonide-formoterol (SYMBICORT) 160-4.5 MCG/ACT inhaler Inhale 1 puff into the lungs daily.   Cholecalciferol (VITAMIN D3) 25 MCG (1000 UT) CAPS 1,000 IU every other day   hyoscyamine (ANASPAZ) 0.125 MG TBDP tablet Place under the tongue as needed.   ketoconazole  (NIZORAL ) 2 % shampoo APPLY TO SCALP LET SIT FOR 3-5 MINUTES AND THEN RINSE.   LOSARTAN POTASSIUM-HCTZ PO Take 25 mg by mouth daily at 12 noon.   mometasone  (ELOCON ) 0.1 % lotion Apply topically  two to three times weekly.   Multiple Vitamins-Minerals (MULTIVITAMIN PO) Take by mouth. Women over 40 vitamin   pantoprazole (PROTONIX) 40 MG tablet Take 40 mg by mouth daily as needed.   rosuvastatin (CRESTOR) 5 MG tablet Take 5 mg by mouth 2 (two) times a week.   [DISCONTINUED] zonisamide  (ZONEGRAN ) 25 MG capsule Take 2 capsules (50 mg total) by mouth every evening.   zonisamide  (ZONEGRAN ) 25 MG capsule Take 2 capsules (50 mg total) by mouth every evening.   [DISCONTINUED] zonisamide  (ZONEGRAN ) 25 MG capsule Take 2 capsules (50 mg total) by mouth every evening.   No facility-administered encounter medications on file as of 11/06/2023.     Follow-Up   Return in about 4 weeks (around 12/04/2023) for For Weight Mangement with Dr. Allie Area.Aaron Aas She was informed of the importance of frequent follow up visits to maximize her success with intensive lifestyle modifications for her multiple health conditions.  Attestation Statement   Reviewed by clinician on day of visit: allergies, medications, problem list, medical history, surgical history, family history, social history, and previous encounter notes.     Ladd Picker, MD

## 2023-11-07 DIAGNOSIS — Z1231 Encounter for screening mammogram for malignant neoplasm of breast: Secondary | ICD-10-CM | POA: Diagnosis not present

## 2023-11-09 ENCOUNTER — Encounter (HOSPITAL_BASED_OUTPATIENT_CLINIC_OR_DEPARTMENT_OTHER): Payer: Self-pay | Admitting: Obstetrics & Gynecology

## 2023-11-09 DIAGNOSIS — I10 Essential (primary) hypertension: Secondary | ICD-10-CM | POA: Diagnosis not present

## 2023-11-09 DIAGNOSIS — Z87898 Personal history of other specified conditions: Secondary | ICD-10-CM | POA: Diagnosis not present

## 2023-12-04 ENCOUNTER — Encounter (INDEPENDENT_AMBULATORY_CARE_PROVIDER_SITE_OTHER): Payer: Self-pay | Admitting: Internal Medicine

## 2023-12-04 ENCOUNTER — Ambulatory Visit (INDEPENDENT_AMBULATORY_CARE_PROVIDER_SITE_OTHER): Admitting: Internal Medicine

## 2023-12-04 VITALS — BP 130/82 | HR 55 | Temp 98.2°F | Ht 59.0 in | Wt 209.0 lb

## 2023-12-04 DIAGNOSIS — Z7985 Long-term (current) use of injectable non-insulin antidiabetic drugs: Secondary | ICD-10-CM

## 2023-12-04 DIAGNOSIS — I10 Essential (primary) hypertension: Secondary | ICD-10-CM | POA: Diagnosis not present

## 2023-12-04 DIAGNOSIS — E66812 Obesity, class 2: Secondary | ICD-10-CM

## 2023-12-04 DIAGNOSIS — E1169 Type 2 diabetes mellitus with other specified complication: Secondary | ICD-10-CM | POA: Diagnosis not present

## 2023-12-04 DIAGNOSIS — Z6839 Body mass index (BMI) 39.0-39.9, adult: Secondary | ICD-10-CM | POA: Diagnosis not present

## 2023-12-04 MED ORDER — TIRZEPATIDE 2.5 MG/0.5ML ~~LOC~~ SOAJ: 2.5000 mg | SUBCUTANEOUS | 0 refills | Status: DC

## 2023-12-04 NOTE — Assessment & Plan Note (Signed)
 Type 2 diabetes is well-controlled with a current A1c of 5.9%. Emphasized the importance of maintaining diabetes management to prevent progression and associated complications. Discussed the benefits of GLP-1 receptor agonists in reducing cardiovascular risks, improving diabetes management, and addressing cardiovascular risk. - Educate on blood glucose monitoring and target ranges before and after meals - Discuss the importance of maintaining diabetes control to prevent complications -Start Mounjaro  2.5 mg once a week

## 2023-12-04 NOTE — Progress Notes (Signed)
 Office: [phone]  /  Fax: [phone]  Weight Summary and Body Composition Analysis (BIA)  Vitals Temp: 98.2 F (36.8 C) BP: 130/82 (manual) Pulse Rate: (!) 55 SpO2: 95 %   Anthropometric Measurements Height: 4' 11 (1.499 m) Weight: 209 lb (94.8 kg) BMI (Calculated): 42.19 Weight at Last Visit: 206 lb Weight Lost Since Last Visit: 0 lb Weight Gained Since Last Visit: 3 lb Starting Weight: 224 lb Total Weight Loss (lbs): 15 lb (6.804 kg) Peak Weight: 234 lb   Body Composition  Body Fat %: 49.4 % Fat Mass (lbs): 103.4 lbs Muscle Mass (lbs): 100.6 lbs Total Body Water (lbs): 81 lbs Visceral Fat Rating : 18    RMR: 1843  Today's Visit #: 36  Starting Date: 08/09/21   Subjective   Chief Complaint: Obesity  Interval History Discussed the use of AI scribe software for clinical note transcription with the patient, who gave verbal consent to proceed.  History of Present Illness   Megan [name] is a 73 year old female with hypertension and type two diabetes who presents for medical weight management.  She is seeking medical weight management due to recent weight gain, particularly during a cruise where she gained between seven to ten pounds. She has since lost most of the weight, retaining only three pounds. She follows a nutrition plan, aiming for around 1200 calories per day, and typically consumes two meals a day. She is mindful of maintaining adequate protein intake, aiming for 90 grams per day.  She has a history of hypertension, with recent episodes of high blood pressure readings, including 225/101 and 190/100. She is currently taking amlodipine  2.5 mg twice daily and losartan with hydrochlorothiazide 100/25 mg in the morning. She increased her amlodipine  dose, which has helped lower her blood pressure. She is concerned about her blood pressure readings and has a follow-up scheduled.  She has a diagnosis of type two diabetes, with her most  recent A1c being 5.9. She monitors her blood glucose levels, with recent readings around 111 mg/dL.  She has experienced side effects from Mounjaro  in the past, including loose stools, which she attributes to dietary incompatibility with the medication. She is considering retrying Mounjaro , as she is now following a nutrition plan and hopes to avoid previous side effects.  She does not take anti-inflammatory medications like Aleve or ibuprofen. She was previously on a medication for appetite suppression, which she is in the process of tapering off.       Challenges affecting patient progress: medical comorbidities.    Pharmacotherapy for weight management: She is currently taking Zonisamide  (off label use, single agent) with adequate clinical response  and without side effects..   Assessment and Plan   Treatment Plan For Obesity:  Recommended Dietary Goals  Arabel is currently in the action stage of change. As such, her goal is to continue weight management plan. She has agreed to: continue current plan and continue to work on implementation of reduced calorie nutrition plan (RCNP)  Behavioral Health and Counseling  We discussed the following behavioral modification strategies today: continue to work on maintaining a reduced calorie state, getting the recommended amount of protein, incorporating whole foods, making healthy choices, staying well hydrated and practicing mindfulness when eating..  Additional education and resources provided today: None  Recommended Physical Activity Goals  Lynnett has been advised to work up to 150 minutes of moderate intensity aerobic activity a week and strengthening exercises 2-3 times per week for cardiovascular health, weight  loss maintenance and preservation of muscle mass.   She has agreed to :  Think about enjoyable ways to increase daily physical activity and overcoming barriers to exercise and Increase physical activity in their day and reduce  sedentary time (increase NEAT).  Medical Interventions and Pharmacotherapy  We discussed various medication options to help Maridee with her weight loss efforts and we both agreed to : Start anti-obesity medication.  In addition to reduced calorie nutrition plan (RCNP), behavioral strategies and physical activity, Tinslee would benefit from pharmacotherapy to assist with hunger signals, satiety and cravings. This will reduce obesity-related health risks by inducing weight loss, and help reduce food consumption and adherence to [facility]) . It may also improve QOL by improving self-confidence and reduce the  setbacks associated with metabolic adaptations.  She was diagnosed with type 2 diabetes in 2023 had a hemoglobin A1c of 6.5 and 6.4.  She has high blood pressure, hypercholesterolemia, asthma, GERD, osteoarthritis of the left hip, coronary artery calcifications on CT so she benefits from GLP-1 treatment not only for type 2 diabetes and weight but also for cardiovascular risk reduction.  After discussion of treatment options, mechanisms of action, benefits, side effects, contraindications and shared decision making she is agreeable to starting Mounjaro  2.5 mg once a week. Patient also made aware that medication is indicated for long-term management of obesity and the risk of weight regain following discontinuation of treatment and hence the importance of adhering to medical weight loss plan.  We demonstrated use of device and patient using teach back method was able to demonstrate proper technique.**  Associated Conditions Impacted by Obesity Treatment  Assessment & Plan Class 2 severe obesity with serious comorbidity and body mass index (BMI) of 39.0 to 39.9 in adult, unspecified obesity type (HCC) Recently gained weight during a cruise but has since lost most of it.  She has successfully lost 34 pounds but began to experience some weight regain as the beginning of this year.  Following a nutrition plan  and aware of the need to manage weight to control associated conditions, including hypertension and type 2 diabetes. Discussed the use of GLP-1 receptor agonists for weight management, highlighting their benefits beyond weight loss, including cardiovascular protection. Mounjaro  is preferred due to its dual medication composition, offering more effective weight management and diabetes control. Previous side effects may have been diet-related, and a retry is considered due to improved dietary habits. - Start Mounjaro  2.5 mg for weight management and diabetes control - Educate on potential side effects of Mounjaro , including loose stools and upset stomach, and the importance of dietary compatibility - Plan to switch to Ozempic if Mounjaro  is not tolerated after one month -See discussion on medication management above -Discontinue zonisamide  by tapering medication off Type 2 diabetes mellitus with obesity (HCC) Type 2 diabetes is well-controlled with a current A1c of 5.9%. Emphasized the importance of maintaining diabetes management to prevent progression and associated complications. Discussed the benefits of GLP-1 receptor agonists in reducing cardiovascular risks, improving diabetes management, and addressing cardiovascular risk. - Educate on blood glucose monitoring and target ranges before and after meals - Discuss the importance of maintaining diabetes control to prevent complications -Start Mounjaro  2.5 mg once a week Essential hypertension Reports high blood pressure readings recently, with a peak of 225/112 mmHg. Currently on amlodipine  and losartan with hydrochlorothiazide. Dietary factors, such as salt and caffeine intake, may have contributed to elevated readings. Current blood pressure reading is 130/82 mmHg, which is acceptable. Advised against NSAIDs  due to potential impact on blood pressure. - Continue current antihypertensive regimen with amlodipine  and losartan with hydrochlorothiazide -  Advise against the use of NSAIDs like Aleve and ibuprofen     General Health Maintenance Advised to maintain a balanced diet and monitor protein intake, especially when consuming two meals a day. Emphasized the importance of not force-feeding and ensuring adequate protein intake. Discussed the physiological response of appetite regulation and the importance of intentional eating to meet nutritional needs. - Ensure adequate protein intake, aiming for 90 grams per day - Consider protein shakes, bars, or [place] yogurt as snacks to meet protein goals - Avoid force-feeding and listen to hunger cues        Objective   Physical Exam:  Blood pressure 130/82, pulse (!) 55, temperature 98.2 F (36.8 C), height 4' 11 (1.499 m), weight 209 lb (94.8 kg), last menstrual period 09/20/2011, SpO2 95%. Body mass index is 42.21 kg/m.  General: She is overweight, cooperative, alert, well developed, and in no acute distress. PSYCH: Has normal mood, affect and thought process.   HEENT: EOMI, sclerae are anicteric. Lungs: Normal breathing effort, no conversational dyspnea. Extremities: No edema.  Neurologic: No gross sensory or motor deficits. No tremors or fasciculations noted.    Diagnostic Data Reviewed:  BMET    Component Value Date/Time   NA 139 01/18/2023 0919   K 3.8 01/18/2023 0919   CL 101 01/18/2023 0919   CO2 23 01/18/2023 0919   GLUCOSE 92 01/18/2023 0919   GLUCOSE 103 (H) 09/21/2015 1635   BUN 26 01/18/2023 0919   CREATININE 0.70 07/27/2023 0857   CALCIUM 9.9 01/18/2023 0919   GFRNONAA >60 09/21/2015 1635   GFRAA >60 09/21/2015 1635   Lab Results  Component Value Date   HGBA1C 5.8 (H) 01/18/2023   HGBA1C 6.4 (H) 08/09/2021   Lab Results  Component Value Date   INSULIN  17.2 01/18/2023   INSULIN  29.6 (H) 08/09/2021   Lab Results  Component Value Date   TSH 2.080 01/18/2023   CBC    Component Value Date/Time   WBC 7.0 01/18/2023 0919   WBC 7.6 09/21/2015 1635   RBC  5.31 (H) 01/18/2023 0919   RBC 5.11 09/29/2022 0000   HGB 14.9 01/18/2023 0919   HCT 46.0 01/18/2023 0919   PLT 240 01/18/2023 0919   MCV 87 01/18/2023 0919   MCH 28.1 01/18/2023 0919   MCH 27.2 09/21/2015 1635   MCHC 32.4 01/18/2023 0919   MCHC 32.3 09/21/2015 1635   RDW 14.6 01/18/2023 0919   Iron Studies No results found for: IRON, TIBC, FERRITIN, IRONPCTSAT Lipid Panel     Component Value Date/Time   CHOL 153 01/18/2023 0919   TRIG 130 01/18/2023 0919   HDL 48 01/18/2023 0919   CHOLHDL 4.8 (H) 05/31/2022 0822   LDLCALC 82 01/18/2023 0919   Hepatic Function Panel     Component Value Date/Time   PROT 6.9 01/18/2023 0919   ALBUMIN 4.5 01/18/2023 0919   AST 16 01/18/2023 0919   ALT 20 01/18/2023 0919   ALKPHOS 87 01/18/2023 0919   BILITOT 0.3 01/18/2023 0919      Component Value Date/Time   TSH 2.080 01/18/2023 0919   Nutritional Lab Results  Component Value Date   VD25OH 75.1 01/18/2023   VD25OH 50.9 05/31/2022   VD25OH 58.8 12/14/2021    Medications: Outpatient Encounter Medications as of 12/04/2023  Medication Sig   amLODipine  (NORVASC ) 2.5 MG tablet Take 2.5 mg by mouth daily.  blood glucose meter kit and supplies KIT Dispense based on patient and insurance preference. Use up to four times daily as directed.   Blood Glucose Monitoring Suppl DEVI 1 each by Does not apply route in the morning, at noon, and at bedtime. May substitute to any manufacturer covered by patient's insurance.   budesonide-formoterol (SYMBICORT) 160-4.5 MCG/ACT inhaler Inhale 1 puff into the lungs daily.   Cholecalciferol (VITAMIN D3) 25 MCG (1000 UT) CAPS 1,000 IU every other day   hyoscyamine (ANASPAZ) 0.125 MG TBDP tablet Place under the tongue as needed.   ketoconazole  (NIZORAL ) 2 % shampoo APPLY TO SCALP LET SIT FOR 3-5 MINUTES AND THEN RINSE.   mometasone  (ELOCON ) 0.1 % lotion Apply topically two to three times weekly.   Multiple Vitamins-Minerals (MULTIVITAMIN PO) Take  by mouth. Women over 40 vitamin   pantoprazole (PROTONIX) 40 MG tablet Take 40 mg by mouth daily as needed.   rosuvastatin (CRESTOR) 5 MG tablet Take 5 mg by mouth 2 (two) times a week.   zonisamide  (ZONEGRAN ) 25 MG capsule Take 2 capsules (50 mg total) by mouth every evening.   [DISCONTINUED] LOSARTAN POTASSIUM-HCTZ PO Take 25 mg by mouth daily at 12 noon.   losartan-hydrochlorothiazide (HYZAAR) 100-25 MG tablet Take 1 tablet by mouth daily.   No facility-administered encounter medications on file as of 12/04/2023.     Follow-Up   No follow-ups on file.SABRA She was informed of the importance of frequent follow up visits to maximize her success with intensive lifestyle modifications for her multiple health conditions.  Attestation Statement   Reviewed by clinician on day of visit: allergies, medications, problem list, medical history, surgical history, family history, social history, and previous encounter notes.     Lucas Parker, MD

## 2023-12-04 NOTE — Assessment & Plan Note (Signed)
 Recently gained weight during a cruise but has since lost most of it.  She has successfully lost 34 pounds but began to experience some weight regain as the beginning of this year.  Following a nutrition plan and aware of the need to manage weight to control associated conditions, including hypertension and type 2 diabetes. Discussed the use of GLP-1 receptor agonists for weight management, highlighting their benefits beyond weight loss, including cardiovascular protection. Mounjaro  is preferred due to its dual medication composition, offering more effective weight management and diabetes control. Previous side effects may have been diet-related, and a retry is considered due to improved dietary habits. - Start Mounjaro  2.5 mg for weight management and diabetes control - Educate on potential side effects of Mounjaro , including loose stools and upset stomach, and the importance of dietary compatibility - Plan to switch to Ozempic if Mounjaro  is not tolerated after one month -See discussion on medication management above -Discontinue zonisamide  by tapering medication off

## 2023-12-04 NOTE — Assessment & Plan Note (Signed)
 Reports high blood pressure readings recently, with a peak of 225/112 mmHg. Currently on amlodipine  and losartan with hydrochlorothiazide. Dietary factors, such as salt and caffeine intake, may have contributed to elevated readings. Current blood pressure reading is 130/82 mmHg, which is acceptable. Advised against NSAIDs due to potential impact on blood pressure. - Continue current antihypertensive regimen with amlodipine  and losartan with hydrochlorothiazide - Advise against the use of NSAIDs like Aleve and ibuprofen

## 2024-01-02 ENCOUNTER — Other Ambulatory Visit (INDEPENDENT_AMBULATORY_CARE_PROVIDER_SITE_OTHER): Payer: Self-pay | Admitting: Internal Medicine

## 2024-01-02 ENCOUNTER — Telehealth (INDEPENDENT_AMBULATORY_CARE_PROVIDER_SITE_OTHER): Payer: Self-pay | Admitting: Internal Medicine

## 2024-01-02 DIAGNOSIS — I10 Essential (primary) hypertension: Secondary | ICD-10-CM

## 2024-01-02 DIAGNOSIS — E1169 Type 2 diabetes mellitus with other specified complication: Secondary | ICD-10-CM

## 2024-01-02 MED ORDER — TIRZEPATIDE 2.5 MG/0.5ML ~~LOC~~ SOAJ: 2.5000 mg | SUBCUTANEOUS | 0 refills | Status: DC

## 2024-01-02 NOTE — Telephone Encounter (Signed)
 Pt called in stating she ran out of Mounjaro  and she needs to take a shot on 01/03/24. Pt is asking if she can get a refill and if not will she be ok without her medication until her appt on 01/07/24. Please follow up with patient.

## 2024-01-07 ENCOUNTER — Encounter (INDEPENDENT_AMBULATORY_CARE_PROVIDER_SITE_OTHER): Payer: Self-pay | Admitting: Internal Medicine

## 2024-01-07 ENCOUNTER — Ambulatory Visit (INDEPENDENT_AMBULATORY_CARE_PROVIDER_SITE_OTHER): Admitting: Internal Medicine

## 2024-01-07 VITALS — BP 138/86 | HR 56 | Temp 98.9°F | Ht 59.0 in | Wt 205.0 lb

## 2024-01-07 DIAGNOSIS — E66812 Obesity, class 2: Secondary | ICD-10-CM | POA: Diagnosis not present

## 2024-01-07 DIAGNOSIS — I10 Essential (primary) hypertension: Secondary | ICD-10-CM | POA: Diagnosis not present

## 2024-01-07 DIAGNOSIS — Z6841 Body Mass Index (BMI) 40.0 and over, adult: Secondary | ICD-10-CM

## 2024-01-07 DIAGNOSIS — Z7985 Long-term (current) use of injectable non-insulin antidiabetic drugs: Secondary | ICD-10-CM

## 2024-01-07 DIAGNOSIS — E785 Hyperlipidemia, unspecified: Secondary | ICD-10-CM | POA: Diagnosis not present

## 2024-01-07 DIAGNOSIS — E1169 Type 2 diabetes mellitus with other specified complication: Secondary | ICD-10-CM | POA: Diagnosis not present

## 2024-01-07 NOTE — Progress Notes (Addendum)
 Office: [phone]  /  Fax: [phone]  Weight Summary and Body Composition Analysis (BIA)  Vitals Temp: 98.9 F (37.2 C) BP: 138/86 Pulse Rate: (!) 56 SpO2: 95 %   Anthropometric Measurements Height: 4' 11 (1.499 m) Weight: 205 lb (93 kg) BMI (Calculated): 41.38 Weight at Last Visit: 209 lb Weight Lost Since Last Visit: 4 lb Weight Gained Since Last Visit: 0 lb Starting Weight: 224 lb Total Weight Loss (lbs): 19 lb (8.618 kg) Peak Weight: 234 lb   Body Composition  Body Fat %: 49 % Fat Mass (lbs): 100.8 lbs Muscle Mass (lbs): 99.4 lbs Total Body Water (lbs): 78 lbs Visceral Fat Rating : 17    RMR: 1843  Today's Visit #: 37  Starting Date: 08/09/21   Subjective   Chief Complaint: Obesity  Interval History Discussed the use of AI scribe software for clinical note transcription with the patient, who gave verbal consent to proceed.  History of Present Illness   Megan [name] is a 73 year old female with hypertension and type 2 diabetes who presents for medical weight management.  She is currently on Mounjaro  2.5 mg once a week for diabetes and obesity management. Since her last visit, she has lost 4 pounds, totaling a 19-pound weight loss. She adheres to a 1200 calorie nutrition plan, tracking calories, consuming more whole foods, meeting protein requirements, and maintaining hydration. She experiences a decrease in appetite and early satiety. Mild flu-like symptoms from Mounjaro  were tolerable and transient. No constipation, diarrhea, or hypoglycemic symptoms such as sweating or shakiness.  Her weight history includes a previous low of 190 pounds in August 2024, with a regain over the holidays. Her highest recorded weight was 234 pounds, and she is currently working towards getting under 200 pounds.  She is not exercising due to logistical challenges, including managing time with her 84 year old daughter. Her husband, a retired International aid/development worker, and she are considering incorporating exercise into their routine, such as using YouTube workouts at home.  She takes losartan and amlodipine  for hypertension. She monitors her blood pressure at home using a wrist cuff but is unsure of its accuracy. She experiences leg cramps, which she attributes to her medication, particularly the diuretic component of losartan hydrochlorothiazide.        Challenges affecting patient progress: low volume of physical activity at present , orthopedic problems, medical conditions or chronic pain affecting mobility, medical comorbidities, and menopause.    Pharmacotherapy for weight management: She is currently taking Monjauro with diabetes as the primary indication and obesity secondary with adequate clinical response  and without side effects..   Assessment and Plan   Treatment Plan For Obesity:  Recommended Dietary Goals  Monay is currently in the action stage of change. As such, her goal is to continue weight management plan. She has agreed to: continue current plan  Behavioral Health and Counseling  We discussed the following behavioral modification strategies today: continue to work on maintaining a reduced calorie state, getting the recommended amount of protein, incorporating whole foods, making healthy choices, staying well hydrated and practicing mindfulness when eating..  Additional education and resources provided today: None  Recommended Physical Activity Goals  Aslan has been advised to work up to 150 minutes of moderate intensity aerobic activity a week and strengthening exercises 2-3 times per week for cardiovascular health, weight loss maintenance and preservation of muscle mass.   She has agreed to :  Think about enjoyable ways to increase daily physical  activity and overcoming barriers to exercise and Increase physical activity in their day and reduce sedentary time (increase NEAT).  Medical Interventions and  Pharmacotherapy  We discussed various medication options to help Ayen with her weight loss efforts and we both agreed to : Adequate clinical response to anti-obesity medication, continue current regimen and do not recommend further increases in GLP-1 due to adequate clinical response   Associated Conditions Impacted by Obesity Treatment  Assessment & Plan Essential hypertension  Type 2 diabetes mellitus with obesity (HCC)  Class 2 severe obesity with serious comorbidity and body mass index (BMI) of 39.0 to 39.9 in adult, unspecified obesity type (HCC)  Hyperlipidemia associated with type 2 diabetes mellitus (HCC)     Assessment and Plan    Obesity and medical weight management Obesity management is ongoing with a current weight loss of 19 pounds. She is on Mounjaro  2.5 mg weekly, primarily for diabetes and secondarily for obesity management. She reports mild flu-like symptoms initially but no significant side effects currently. Appetite suppression is adequate, and she is achieving a sense of fullness. She adheres to a 1200 calorie nutrition plan, tracking calories, eating whole foods, and maintaining hydration. No exercise currently due to logistical challenges, but plans to incorporate more physical activity. Emphasis on the importance of exercise to prevent muscle loss and maintain weight loss. Discussed the importance of transitioning through different phases of weight management: initiation, weight loss, and maintenance. Highlighted the need for 240 minutes of exercise per week during the maintenance phase to prevent muscle loss and support weight maintenance. - Continue Mounjaro  2.5 mg weekly - Maintain 1200 calorie nutrition plan - Increase physical activity gradually - Incorporate protein intake of 90 grams per day - Explore non-dairy protein supplements without sucralose - Encourage use of online exercise resources and home workouts  Type 2 diabetes mellitus Type 2 diabetes is  being managed with Mounjaro , which is also aiding in weight management. No symptoms of hypoglycemia reported. - Continue Mounjaro  2.5 mg weekly  Hypertension Hypertension is being managed with losartan and amlodipine . Current blood pressure is 138/86 mmHg, which is an improvement for her. She uses a wrist blood pressure monitor at home but is advised to use an upper arm monitor for more accurate readings. She experiences leg cramps, likely due to the diuretic component of losartan hydrochlorothiazide. - Monitor blood pressure at home using wrist monitor - Ensure adequate hydration to mitigate leg cramps - Consider obtaining an upper arm blood pressure monitor        Objective   Physical Exam:  Blood pressure 138/86, pulse (!) 56, temperature 98.9 F (37.2 C), height 4' 11 (1.499 m), weight 205 lb (93 kg), last menstrual period 09/20/2011, SpO2 95%. Body mass index is 41.4 kg/m.  General: She is overweight, cooperative, alert, well developed, and in no acute distress. PSYCH: Has normal mood, affect and thought process.   HEENT: EOMI, sclerae are anicteric. Lungs: Normal breathing effort, no conversational dyspnea. Extremities: No edema.  Neurologic: No gross sensory or motor deficits. No tremors or fasciculations noted.    Diagnostic Data Reviewed:  BMET    Component Value Date/Time   NA 139 01/18/2023 0919   K 3.8 01/18/2023 0919   CL 101 01/18/2023 0919   CO2 23 01/18/2023 0919   GLUCOSE 92 01/18/2023 0919   GLUCOSE 103 (H) 09/21/2015 1635   BUN 26 01/18/2023 0919   CREATININE 0.70 07/27/2023 0857   CALCIUM 9.9 01/18/2023 0919   GFRNONAA >60 09/21/2015 1635  GFRAA >60 09/21/2015 1635   Lab Results  Component Value Date   HGBA1C 5.8 (H) 01/18/2023   HGBA1C 6.4 (H) 08/09/2021   Lab Results  Component Value Date   INSULIN  17.2 01/18/2023   INSULIN  29.6 (H) 08/09/2021   Lab Results  Component Value Date   TSH 2.080 01/18/2023   CBC    Component Value  Date/Time   WBC 7.0 01/18/2023 0919   WBC 7.6 09/21/2015 1635   RBC 5.31 (H) 01/18/2023 0919   RBC 5.11 09/29/2022 0000   HGB 14.9 01/18/2023 0919   HCT 46.0 01/18/2023 0919   PLT 240 01/18/2023 0919   MCV 87 01/18/2023 0919   MCH 28.1 01/18/2023 0919   MCH 27.2 09/21/2015 1635   MCHC 32.4 01/18/2023 0919   MCHC 32.3 09/21/2015 1635   RDW 14.6 01/18/2023 0919   Iron Studies No results found for: IRON, TIBC, FERRITIN, IRONPCTSAT Lipid Panel     Component Value Date/Time   CHOL 153 01/18/2023 0919   TRIG 130 01/18/2023 0919   HDL 48 01/18/2023 0919   CHOLHDL 4.8 (H) 05/31/2022 0822   LDLCALC 82 01/18/2023 0919   Hepatic Function Panel     Component Value Date/Time   PROT 6.9 01/18/2023 0919   ALBUMIN 4.5 01/18/2023 0919   AST 16 01/18/2023 0919   ALT 20 01/18/2023 0919   ALKPHOS 87 01/18/2023 0919   BILITOT 0.3 01/18/2023 0919      Component Value Date/Time   TSH 2.080 01/18/2023 0919   Nutritional Lab Results  Component Value Date   VD25OH 75.1 01/18/2023   VD25OH 50.9 05/31/2022   VD25OH 58.8 12/14/2021    Medications: Outpatient Encounter Medications as of 01/07/2024  Medication Sig   amLODipine  (NORVASC ) 2.5 MG tablet Take 2.5 mg by mouth daily.   blood glucose meter kit and supplies KIT Dispense based on patient and insurance preference. Use up to four times daily as directed.   Blood Glucose Monitoring Suppl DEVI 1 each by Does not apply route in the morning, at noon, and at bedtime. May substitute to any manufacturer covered by patient's insurance.   budesonide-formoterol (SYMBICORT) 160-4.5 MCG/ACT inhaler Inhale 1 puff into the lungs daily.   Cholecalciferol (VITAMIN D3) 25 MCG (1000 UT) CAPS 1,000 IU every other day   hyoscyamine (ANASPAZ) 0.125 MG TBDP tablet Place under the tongue as needed.   ketoconazole  (NIZORAL ) 2 % shampoo APPLY TO SCALP LET SIT FOR 3-5 MINUTES AND THEN RINSE.   losartan-hydrochlorothiazide (HYZAAR) 100-25 MG tablet Take  1 tablet by mouth daily.   mometasone  (ELOCON ) 0.1 % lotion Apply topically two to three times weekly.   Multiple Vitamins-Minerals (MULTIVITAMIN PO) Take by mouth. Women over 40 vitamin   pantoprazole (PROTONIX) 40 MG tablet Take 40 mg by mouth daily as needed.   rosuvastatin (CRESTOR) 5 MG tablet Take 5 mg by mouth 2 (two) times a week.   tirzepatide  (MOUNJARO ) 2.5 MG/0.5ML Pen Inject 2.5 mg into the skin once a week.   [DISCONTINUED] tirzepatide  (MOUNJARO ) 2.5 MG/0.5ML Pen Inject 2.5 mg into the skin once a week.   [DISCONTINUED] zonisamide  (ZONEGRAN ) 25 MG capsule Take 2 capsules (50 mg total) by mouth every evening.   No facility-administered encounter medications on file as of 01/07/2024.     Follow-Up   No follow-ups on file.SABRA She was informed of the importance of frequent follow up visits to maximize her success with intensive lifestyle modifications for her multiple health conditions.  Attestation Statement  Reviewed by clinician on day of visit: allergies, medications, problem list, medical history, surgical history, family history, social history, and previous encounter notes.     Lucas Parker, MD

## 2024-01-10 DIAGNOSIS — M7989 Other specified soft tissue disorders: Secondary | ICD-10-CM | POA: Diagnosis not present

## 2024-01-10 DIAGNOSIS — N644 Mastodynia: Secondary | ICD-10-CM | POA: Diagnosis not present

## 2024-01-17 ENCOUNTER — Ambulatory Visit (INDEPENDENT_AMBULATORY_CARE_PROVIDER_SITE_OTHER)

## 2024-01-17 ENCOUNTER — Ambulatory Visit: Admitting: Podiatry

## 2024-01-17 ENCOUNTER — Encounter: Payer: Self-pay | Admitting: Podiatry

## 2024-01-17 VITALS — Ht 59.0 in | Wt 205.0 lb

## 2024-01-17 DIAGNOSIS — M659 Unspecified synovitis and tenosynovitis, unspecified site: Secondary | ICD-10-CM

## 2024-01-17 DIAGNOSIS — M21611 Bunion of right foot: Secondary | ICD-10-CM | POA: Diagnosis not present

## 2024-01-17 DIAGNOSIS — M21619 Bunion of unspecified foot: Secondary | ICD-10-CM

## 2024-01-17 DIAGNOSIS — S9031XA Contusion of right foot, initial encounter: Secondary | ICD-10-CM

## 2024-01-17 DIAGNOSIS — M7751 Other enthesopathy of right foot: Secondary | ICD-10-CM

## 2024-01-17 DIAGNOSIS — M65871 Other synovitis and tenosynovitis, right ankle and foot: Secondary | ICD-10-CM

## 2024-01-17 MED ORDER — TRIAMCINOLONE ACETONIDE 10 MG/ML IJ SUSP
10.0000 mg | Freq: Once | INTRAMUSCULAR | Status: AC
  Administered 2024-01-17: 10 mg via INTRA_ARTICULAR

## 2024-01-17 NOTE — Progress Notes (Addendum)
 Subjective:   Patient ID: Megan [name], female   DOB: 73 y.o.   MRN: [id number]   HPI Patient states the third toe right has been bothering her a lot and it started with a rash and it is now swollen and hard to wear shoe gear.  She does not remember injury has not had a history of gout or arthritis in the past and does not smoke likes to be active   Review of Systems  All other systems reviewed and are negative.       Objective:  Physical Exam Vitals and nursing note reviewed.  Constitutional:      Appearance: She is well-developed.  Pulmonary:     Effort: Pulmonary effort is normal.  Musculoskeletal:        General: Normal range of motion.  Skin:    General: Skin is warm.  Neurological:     Mental Status: She is alert.     Neurovascular status intact muscle strength found to be adequate range of motion adequate with inflammation pain of the third digit right with swelling at the distal to phalangeal joint is localized no proximal edema at the mid no break in the skin noted. 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  Reviewed x-ray and today I did sterile prep and injected the inner phalangeal joint 3 into the synovium 2mg  dexamethasone Kenalog  5 mg Xylocaine applied sterile dressing instructed on wider shoes and if symptoms persist may require arthroplasty then I made her aware of today.  Also placed on oral diclofenex  therapy and patient is encouraged to call with questions  X-rays indicate there may be moderate spurring and narrowing of the joint of the third digit right medial surface with moderate bunion right

## 2024-01-22 ENCOUNTER — Ambulatory Visit (INDEPENDENT_AMBULATORY_CARE_PROVIDER_SITE_OTHER): Admitting: Internal Medicine

## 2024-01-22 ENCOUNTER — Encounter (INDEPENDENT_AMBULATORY_CARE_PROVIDER_SITE_OTHER): Payer: Self-pay | Admitting: Internal Medicine

## 2024-01-22 VITALS — BP 132/84 | HR 59 | Temp 98.4°F | Ht 59.0 in | Wt 205.0 lb

## 2024-01-22 DIAGNOSIS — Z7985 Long-term (current) use of injectable non-insulin antidiabetic drugs: Secondary | ICD-10-CM | POA: Diagnosis not present

## 2024-01-22 DIAGNOSIS — Z6839 Body mass index (BMI) 39.0-39.9, adult: Secondary | ICD-10-CM

## 2024-01-22 DIAGNOSIS — E66812 Obesity, class 2: Secondary | ICD-10-CM

## 2024-01-22 DIAGNOSIS — I10 Essential (primary) hypertension: Secondary | ICD-10-CM | POA: Diagnosis not present

## 2024-01-22 DIAGNOSIS — E1169 Type 2 diabetes mellitus with other specified complication: Secondary | ICD-10-CM

## 2024-01-22 MED ORDER — TIRZEPATIDE 5 MG/0.5ML ~~LOC~~ SOAJ
5.0000 mg | SUBCUTANEOUS | 1 refills | Status: DC
Start: 2024-01-22 — End: 2024-03-17

## 2024-01-22 NOTE — Assessment & Plan Note (Signed)
 She has experienced a total weight loss of 19 pounds since starting program.  Currently on Mounjaro  2.5 mg once a week, she has tolerated it well with minimal side effects. Increasing the dose to 5 mg is expected to result in a 10-15% weight loss. She is not currently exercising and reports fair adherence to a calorie-restricted nutrition plan. - Increase Mounjaro  to 5 mg once a week. - Monitor for side effects at the increased dose. - Maintain a calorie-restricted nutrition plan. - Encourage exercise as part of weight management.

## 2024-01-22 NOTE — Progress Notes (Signed)
 Office: [phone]  /  Fax: [phone]  Weight Summary and Body Composition Analysis (BIA)  Vitals Temp: 98.4 F (36.9 C) BP: 132/84 Pulse Rate: (!) 59 SpO2: 96 %   Anthropometric Measurements Height: 4' 11 (1.499 m) Weight: 205 lb (93 kg) BMI (Calculated): 41.38 Weight at Last Visit: 205 lb Weight Lost Since Last Visit: 0 lb Weight Gained Since Last Visit: 0 lb Starting Weight: 224 lb Total Weight Loss (lbs): 19 lb (8.618 kg) Peak Weight: 234 lb   Body Composition  Body Fat %: 49.2 % Fat Mass (lbs): 101 lbs Muscle Mass (lbs): 99 lbs Total Body Water (lbs): 77.8 lbs Visceral Fat Rating : 17    RMR: 1843  Today's Visit #: 38  Starting Date: 08/09/21   Subjective   Chief Complaint: Obesity  Interval History Discussed the use of AI scribe software for clinical note transcription with the patient, who gave verbal consent to proceed.  History of Present Illness Megan [name] is a 73 year old female with type 2 diabetes and hypertension who presents for medical weight management.  Since last office visit Megan [name] has maintained Megan [name] reports following a 1200-calorie nutrition plan 50% of the time Megan [name] is not tracking Megan [name] reports eating more whole foods getting the recommended amount of protein maintaining adequate hydration denies skipping meals Megan [name] has not been exercising.  Megan [name] has been on Mounjaro  2.5 mg once a week for two months. Megan [name] follows a self-directed calorie nutrition plan with fair adherence but is not currently engaging in physical exercise. Megan [name] experiences mild, occasional flu-like symptoms and transient stomach issues that resolve with a heating pad. No diarrhea or constipation.  Her type 2 diabetes is managed with Mounjaro , and Megan [name] reports no significant side effects from the medication. Her hypertension is managed with losartan, but Megan [name] recently stopped taking amlodipine  due to lightheadedness and dizziness. Her blood pressure readings have  been around 140/90, with a recent reading of 132/84.  Socially, Megan [name] is raising her two grandchildren, a 55 year old boy and a 55 year old girl with ADHD. Megan [name] describes the challenges of managing their needs and her desire for more personal time. Megan [name] and her supportive husband have not played golf in 16 years due to family responsibilities. Megan [name] wants to resume activities like going to the gym and playing golf.     Challenges affecting patient progress: lack of time for self-care, multiple competing priorities, low volume of physical activity at present , lack of strengthening exercise, medical comorbidities, moderate to high levels of stress, and menopause.    Pharmacotherapy for weight management: Megan [name] is currently taking Monjauro with diabetes as the primary indication and obesity secondary with adequate clinical response  and without side effects..   Assessment and Plan   Treatment Plan For Obesity:  Recommended Dietary Goals  Megan [name] is currently in the action stage of change. As such, her goal is to continue weight management plan. Megan [name] has agreed to: continue current plan  Behavioral Health and Counseling  We discussed the following behavioral modification strategies today: continue to work on maintaining a reduced calorie state, getting the recommended amount of protein, incorporating whole foods, making healthy choices, staying well hydrated and practicing mindfulness when eating. and increase protein intake, fibrous foods (25 grams per day for women, 30 grams for men) and water to improve satiety and decrease hunger signals. .  Additional education and resources provided today: None  Recommended Physical Activity Goals  Megan [name] has been advised to work up to 150  minutes of moderate intensity aerobic activity a week and strengthening exercises 2-3 times per week for cardiovascular health, weight loss maintenance and preservation of muscle mass.  Megan [name] has agreed to :  Think about  enjoyable ways to increase daily physical activity and overcoming barriers to exercise, Increase physical activity in their day and reduce sedentary time (increase NEAT)., Increase volume of physical activity to a goal of 240 minutes a week, and Combine aerobic and strengthening exercises for efficiency and improved cardiometabolic health.  Medical Interventions and Pharmacotherapy  We discussed various medication options to help Megan [name] with her weight loss efforts and we both agreed to : Increase Mounjaro  to 5 mg once a week  Associated Conditions Impacted by Obesity Treatment  Assessment & Plan Essential hypertension Currently on losartan and has self-discontinued amlodipine  due to lightheadedness and dizziness. Blood pressure is 132/84 mmHg, slightly above the target of less than 130/80 mmHg. Mounjaro  may help further lower blood pressure. - Continue losartan for blood pressure management. - Monitor blood pressure to achieve target of less than 130/80 mmHg. - Discuss any symptoms of chest pain with primary care provider. - Monitor for orthostasis that we titrate Mounjaro  Type 2 diabetes mellitus with obesity (HCC) Type 2 diabetes is well-controlled with a current A1c of 5.9%. Emphasized the importance of maintaining diabetes management to prevent progression and associated complications. Discussed the benefits of GLP-1 receptor agonists in reducing cardiovascular risks, improving diabetes management, and addressing cardiovascular risk. - Educate on blood glucose monitoring and target ranges before and after meals - Discuss the importance of maintaining diabetes control to prevent complications - Increase Mounjaro  to 5 mg once a week Class 2 severe obesity with serious comorbidity and body mass index (BMI) of 39.0 to 39.9 in adult, unspecified obesity type (HCC) Megan [name] has experienced a total weight loss of 19 pounds since starting program.  Currently on Mounjaro  2.5 mg once a week, Megan [name] has  tolerated it well with minimal side effects. Increasing the dose to 5 mg is expected to result in a 10-15% weight loss. Megan [name] is not currently exercising and reports fair adherence to a calorie-restricted nutrition plan. - Increase Mounjaro  to 5 mg once a week. - Monitor for side effects at the increased dose. - Maintain a calorie-restricted nutrition plan. - Encourage exercise as part of weight management.         Objective   Physical Exam:  Blood pressure 132/84, pulse (!) 59, temperature 98.4 F (36.9 C), height 4' 11 (1.499 m), weight 205 lb (93 kg), last menstrual period 09/20/2011, SpO2 96%. Body mass index is 41.4 kg/m.  General: Megan [name] is overweight, cooperative, alert, well developed, and in no acute distress. PSYCH: Has normal mood, affect and thought process.   HEENT: EOMI, sclerae are anicteric. Lungs: Normal breathing effort, no conversational dyspnea. Extremities: No edema.  Neurologic: No gross sensory or motor deficits. No tremors or fasciculations noted.    Diagnostic Data Reviewed:  BMET    Component Value Date/Time   NA 139 01/18/2023 0919   K 3.8 01/18/2023 0919   CL 101 01/18/2023 0919   CO2 23 01/18/2023 0919   GLUCOSE 92 01/18/2023 0919   GLUCOSE 103 (H) 09/21/2015 1635   BUN 26 01/18/2023 0919   CREATININE 0.70 07/27/2023 0857   CALCIUM 9.9 01/18/2023 0919   GFRNONAA >60 09/21/2015 1635   GFRAA >60 09/21/2015 1635   Lab Results  Component Value Date   HGBA1C 5.8 (H) 01/18/2023   HGBA1C 6.4 (H) 08/09/2021  Lab Results  Component Value Date   INSULIN  17.2 01/18/2023   INSULIN  29.6 (H) 08/09/2021   Lab Results  Component Value Date   TSH 2.080 01/18/2023   CBC    Component Value Date/Time   WBC 7.0 01/18/2023 0919   WBC 7.6 09/21/2015 1635   RBC 5.31 (H) 01/18/2023 0919   RBC 5.11 09/29/2022 0000   HGB 14.9 01/18/2023 0919   HCT 46.0 01/18/2023 0919   PLT 240 01/18/2023 0919   MCV 87 01/18/2023 0919   MCH 28.1 01/18/2023 0919    MCH 27.2 09/21/2015 1635   MCHC 32.4 01/18/2023 0919   MCHC 32.3 09/21/2015 1635   RDW 14.6 01/18/2023 0919   Iron Studies No results found for: IRON, TIBC, FERRITIN, IRONPCTSAT Lipid Panel     Component Value Date/Time   CHOL 153 01/18/2023 0919   TRIG 130 01/18/2023 0919   HDL 48 01/18/2023 0919   CHOLHDL 4.8 (H) 05/31/2022 0822   LDLCALC 82 01/18/2023 0919   Hepatic Function Panel     Component Value Date/Time   PROT 6.9 01/18/2023 0919   ALBUMIN 4.5 01/18/2023 0919   AST 16 01/18/2023 0919   ALT 20 01/18/2023 0919   ALKPHOS 87 01/18/2023 0919   BILITOT 0.3 01/18/2023 0919      Component Value Date/Time   TSH 2.080 01/18/2023 0919   Nutritional Lab Results  Component Value Date   VD25OH 75.1 01/18/2023   VD25OH 50.9 05/31/2022   VD25OH 58.8 12/14/2021    Medications: Outpatient Encounter Medications as of 01/22/2024  Medication Sig   amLODipine  (NORVASC ) 2.5 MG tablet Take 2.5 mg by mouth daily.   blood glucose meter kit and supplies KIT Dispense based on patient and insurance preference. Use up to four times daily as directed.   Blood Glucose Monitoring Suppl DEVI 1 each by Does not apply route in the morning, at noon, and at bedtime. May substitute to any manufacturer covered by patient's insurance.   budesonide-formoterol (SYMBICORT) 160-4.5 MCG/ACT inhaler Inhale 1 puff into the lungs daily.   Cholecalciferol (VITAMIN D3) 25 MCG (1000 UT) CAPS 1,000 IU every other day   hyoscyamine (ANASPAZ) 0.125 MG TBDP tablet Place under the tongue as needed.   ketoconazole  (NIZORAL ) 2 % shampoo APPLY TO SCALP LET SIT FOR 3-5 MINUTES AND THEN RINSE.   losartan-hydrochlorothiazide (HYZAAR) 100-25 MG tablet Take 1 tablet by mouth daily.   mometasone  (ELOCON ) 0.1 % lotion Apply topically two to three times weekly.   Multiple Vitamins-Minerals (MULTIVITAMIN PO) Take by mouth. Women over 40 vitamin   pantoprazole (PROTONIX) 40 MG tablet Take 40 mg by mouth daily as  needed.   rosuvastatin (CRESTOR) 5 MG tablet Take 5 mg by mouth 2 (two) times a week.   tirzepatide  (MOUNJARO ) 5 MG/0.5ML Pen Inject 5 mg into the skin once a week.   [DISCONTINUED] tirzepatide  (MOUNJARO ) 2.5 MG/0.5ML Pen Inject 2.5 mg into the skin once a week.   No facility-administered encounter medications on file as of 01/22/2024.     Follow-Up   Return in about 4 weeks (around 02/19/2024) for For Weight Mangement with Dr. Francyne.SABRA Megan [name] was informed of the importance of frequent follow up visits to maximize her success with intensive lifestyle modifications for her multiple health conditions.  Attestation Statement   Reviewed by clinician on day of visit: allergies, medications, problem list, medical history, surgical history, family history, social history, and previous encounter notes.     Lucas Francyne, MD

## 2024-01-22 NOTE — Assessment & Plan Note (Signed)
 Type 2 diabetes is well-controlled with a current A1c of 5.9%. Emphasized the importance of maintaining diabetes management to prevent progression and associated complications. Discussed the benefits of GLP-1 receptor agonists in reducing cardiovascular risks, improving diabetes management, and addressing cardiovascular risk. - Educate on blood glucose monitoring and target ranges before and after meals - Discuss the importance of maintaining diabetes control to prevent complications - Increase Mounjaro  to 5 mg once a week

## 2024-01-22 NOTE — Assessment & Plan Note (Signed)
 Currently on losartan and has self-discontinued amlodipine  due to lightheadedness and dizziness. Blood pressure is 132/84 mmHg, slightly above the target of less than 130/80 mmHg. Mounjaro  may help further lower blood pressure. - Continue losartan for blood pressure management. - Monitor blood pressure to achieve target of less than 130/80 mmHg. - Discuss any symptoms of chest pain with primary care provider. - Monitor for orthostasis that we titrate Mounjaro 

## 2024-01-29 ENCOUNTER — Other Ambulatory Visit (INDEPENDENT_AMBULATORY_CARE_PROVIDER_SITE_OTHER): Payer: Self-pay | Admitting: Internal Medicine

## 2024-01-29 DIAGNOSIS — E66812 Obesity, class 2: Secondary | ICD-10-CM

## 2024-01-29 DIAGNOSIS — E1169 Type 2 diabetes mellitus with other specified complication: Secondary | ICD-10-CM

## 2024-01-29 DIAGNOSIS — I10 Essential (primary) hypertension: Secondary | ICD-10-CM

## 2024-02-01 DIAGNOSIS — R7303 Prediabetes: Secondary | ICD-10-CM | POA: Diagnosis not present

## 2024-02-01 DIAGNOSIS — E782 Mixed hyperlipidemia: Secondary | ICD-10-CM | POA: Diagnosis not present

## 2024-02-04 ENCOUNTER — Ambulatory Visit (INDEPENDENT_AMBULATORY_CARE_PROVIDER_SITE_OTHER): Admitting: Internal Medicine

## 2024-02-18 ENCOUNTER — Encounter (INDEPENDENT_AMBULATORY_CARE_PROVIDER_SITE_OTHER): Payer: Self-pay | Admitting: Internal Medicine

## 2024-02-18 ENCOUNTER — Ambulatory Visit (INDEPENDENT_AMBULATORY_CARE_PROVIDER_SITE_OTHER): Admitting: Internal Medicine

## 2024-02-18 VITALS — BP 136/82 | HR 58 | Temp 98.5°F | Ht 59.0 in | Wt 200.0 lb

## 2024-02-18 DIAGNOSIS — I152 Hypertension secondary to endocrine disorders: Secondary | ICD-10-CM | POA: Diagnosis not present

## 2024-02-18 DIAGNOSIS — E66813 Obesity, class 3: Secondary | ICD-10-CM

## 2024-02-18 DIAGNOSIS — E785 Hyperlipidemia, unspecified: Secondary | ICD-10-CM

## 2024-02-18 DIAGNOSIS — Z7985 Long-term (current) use of injectable non-insulin antidiabetic drugs: Secondary | ICD-10-CM | POA: Diagnosis not present

## 2024-02-18 DIAGNOSIS — E1159 Type 2 diabetes mellitus with other circulatory complications: Secondary | ICD-10-CM

## 2024-02-18 DIAGNOSIS — Z6841 Body Mass Index (BMI) 40.0 and over, adult: Secondary | ICD-10-CM

## 2024-02-18 DIAGNOSIS — E1169 Type 2 diabetes mellitus with other specified complication: Secondary | ICD-10-CM | POA: Diagnosis not present

## 2024-02-18 NOTE — Assessment & Plan Note (Signed)
 A1c is 5.9.  Currently on Mounjaro  5 mg once a week without any adverse effects continue current medication along with nutrition and behavioral strategies.

## 2024-02-18 NOTE — Assessment & Plan Note (Signed)
 Statin intolerance, previously experienced leg cramps with statin use. Symptoms improved after discontinuation. Consideration for alternative statin therapy due to cardiovascular risk, but currently off statins. - Consider alternative statin therapy if necessary

## 2024-02-18 NOTE — Assessment & Plan Note (Signed)
 Essential hypertension, currently not on medication due to recent COVID-19 infection and interaction with Paxlovid. Blood pressure was 136/82 mmHg during the visit. Weight loss is expected to further reduce blood pressure, with an anticipated drop of 1 point for every 2 pounds lost. - Monitor blood pressure - Continue weight loss efforts to aid in blood pressure reduction

## 2024-02-18 NOTE — Assessment & Plan Note (Signed)
 Obesity with abnormal food appetite Obesity with abnormal food appetite, currently managed with Mounjaro  5 mg. She has lost 5 pounds since the last visit, indicating a healthy rate of weight loss. Reports good appetite control and increased sense of fullness without side effects from the medication. Emphasis on maintaining a balanced diet with adequate protein intake to prevent muscle loss. Encouraged to avoid an all-or-none approach to exercise and diet.  She is concerned about lax skin.  We discussed things beneficial and also gradual weight loss but sometimes this cannot be prevented and may require considering surgery once she reaches her goal weight and is able to maintain her weight. - Continue Mounjaro  5 mg - Maintain 1200 calorie nutrition plan - Encourage balanced diet with emphasis on protein intake - Encourage regular physical activity, including strengthening exercises - Advise against an all-or-none approach to exercise and diet

## 2024-02-18 NOTE — Progress Notes (Signed)
 Office: [phone]  /  Fax: [phone]  Weight Summary and Body Composition Analysis (BIA)  Vitals Temp: 98.5 F (36.9 C) BP: 136/82 Pulse Rate: (!) 58 SpO2: 96 %   Anthropometric Measurements Height: 4' 11 (1.499 m) Weight: 200 lb (90.7 kg) BMI (Calculated): 40.37 Weight at Last Visit: 205 lb Weight Lost Since Last Visit: 5 lb Weight Gained Since Last Visit: 0 lb Starting Weight: 224 lb Total Weight Loss (lbs): 24 lb (10.9 kg) Peak Weight: 234 lb   Body Composition  Body Fat %: 48.8 % Fat Mass (lbs): 98 lbs Muscle Mass (lbs): 97.6 lbs Total Body Water (lbs): 76.2 lbs Visceral Fat Rating : 17    RMR: 1843  Today's Visit #: 39  Starting Date: 08/09/21   Subjective   Chief Complaint: Obesity  Interval History Discussed the use of AI scribe software for clinical note transcription with the patient, who gave verbal consent to proceed.  History of Present Illness Megan [name] is a 73 year old female with hypertension and type 2 diabetes who presents for medical weight management.  She has lost five pounds since her last visit, following an increase in her Mounjaro  dose to five milligrams. She follows a 1200 calorie nutrition plan and notes a reduction in appetite and an increased sense of fullness. No side effects from the increased Mounjaro  dose, such as flu-like symptoms or gastrointestinal issues, are reported.  A recent COVID-19 infection, contracted from her 18 year old daughter, affected her and her husband, while her 66 year old son remained unaffected. The illness lasted about two weeks, impacting her physical activity levels. She continues to experience a cough with thick mucus but expects her activity levels to increase as she recovers.  Her physical activity has decreased due to the recent COVID-19 infection, but she remains active, averaging 6039 steps per day over the last week. She is not sedentary and stays busy with daily  activities.  She was temporarily taken off her statin and amlodipine  due to leg cramps and a low blood pressure reading of 122/70 at a recent visit. She noticed a significant improvement in leg cramps after stopping the statin.     Challenges affecting patient progress: lack of time for self-care, multiple competing priorities, low volume of physical activity at present , lack of strengthening exercise, medical comorbidities, moderate to high levels of stress, and menopause.    Pharmacotherapy for weight management: She is currently taking Monjauro with diabetes as the primary indication and obesity secondary with adequate clinical response  and without side effects..   Assessment and Plan   Treatment Plan For Obesity:  Recommended Dietary Goals  Megan [name] is currently in the action stage of change. As such, her goal is to continue weight management plan. She has agreed to: continue current plan  Behavioral Health and Counseling  We discussed the following behavioral modification strategies today: continue to work on maintaining a reduced calorie state, getting the recommended amount of protein, incorporating whole foods, making healthy choices, staying well hydrated and practicing mindfulness when eating. and increase protein intake, fibrous foods (25 grams per day for women, 30 grams for men) and water to improve satiety and decrease hunger signals. .  Additional education and resources provided today: None  Recommended Physical Activity Goals  [name] has been advised to work up to 150 minutes of moderate intensity aerobic activity a week and strengthening exercises 2-3 times per week for cardiovascular health, weight loss maintenance and preservation of muscle mass.  She has  agreed to :  Think about enjoyable ways to increase daily physical activity and overcoming barriers to exercise, Increase physical activity in their day and reduce sedentary time (increase NEAT)., Increase volume  of physical activity to a goal of 240 minutes a week, and Combine aerobic and strengthening exercises for efficiency and improved cardiometabolic health.  Medical Interventions and Pharmacotherapy  We discussed various medication options to help Kashari with her weight loss efforts and we both agreed to : Adequate clinical response to anti-obesity medication, continue current regimen  Associated Conditions Impacted by Obesity Treatment  Assessment & Plan Hypertension associated with type 2 diabetes mellitus (HCC) Essential hypertension, currently not on medication due to recent COVID-19 infection and interaction with Paxlovid. Blood pressure was 136/82 mmHg during the visit. Weight loss is expected to further reduce blood pressure, with an anticipated drop of 1 point for every 2 pounds lost. - Monitor blood pressure - Continue weight loss efforts to aid in blood pressure reduction Type 2 diabetes mellitus with obesity A1c is 5.9.  Currently on Mounjaro  5 mg once a week without any adverse effects continue current medication along with nutrition and behavioral strategies. Hyperlipidemia associated with type 2 diabetes mellitus (HCC) Statin intolerance, previously experienced leg cramps with statin use. Symptoms improved after discontinuation. Consideration for alternative statin therapy due to cardiovascular risk, but currently off statins. - Consider alternative statin therapy if necessary Class 3 severe obesity with serious comorbidity and body mass index (BMI) of 40.0 to 44.9 in adult, unspecified obesity type Obesity with abnormal food appetite Obesity with abnormal food appetite, currently managed with Mounjaro  5 mg. She has lost 5 pounds since the last visit, indicating a healthy rate of weight loss. Reports good appetite control and increased sense of fullness without side effects from the medication. Emphasis on maintaining a balanced diet with adequate protein intake to prevent muscle  loss. Encouraged to avoid an all-or-none approach to exercise and diet.  She is concerned about lax skin.  We discussed things beneficial and also gradual weight loss but sometimes this cannot be prevented and may require considering surgery once she reaches her goal weight and is able to maintain her weight. - Continue Mounjaro  5 mg - Maintain 1200 calorie nutrition plan - Encourage balanced diet with emphasis on protein intake - Encourage regular physical activity, including strengthening exercises - Advise against an all-or-none approach to exercise and diet         Objective   Physical Exam:  Blood pressure 136/82, pulse (!) 58, temperature 98.5 F (36.9 C), height 4' 11 (1.499 m), weight 200 lb (90.7 kg), last menstrual period 09/20/2011, SpO2 96%. Body mass index is 40.4 kg/m.  General: She is overweight, cooperative, alert, well developed, and in no acute distress. PSYCH: Has normal mood, affect and thought process.   HEENT: EOMI, sclerae are anicteric. Lungs: Normal breathing effort, no conversational dyspnea. Extremities: No edema.  Neurologic: No gross sensory or motor deficits. No tremors or fasciculations noted.    Diagnostic Data Reviewed:  BMET    Component Value Date/Time   NA 139 01/18/2023 0919   K 3.8 01/18/2023 0919   CL 101 01/18/2023 0919   CO2 23 01/18/2023 0919   GLUCOSE 92 01/18/2023 0919   GLUCOSE 103 (H) 09/21/2015 1635   BUN 26 01/18/2023 0919   CREATININE 0.70 07/27/2023 0857   CALCIUM 9.9 01/18/2023 0919   GFRNONAA >60 09/21/2015 1635   GFRAA >60 09/21/2015 1635   Lab Results  Component Value Date  HGBA1C 5.8 (H) 01/18/2023   HGBA1C 6.4 (H) 08/09/2021   Lab Results  Component Value Date   INSULIN  17.2 01/18/2023   INSULIN  29.6 (H) 08/09/2021   Lab Results  Component Value Date   TSH 2.080 01/18/2023   CBC    Component Value Date/Time   WBC 7.0 01/18/2023 0919   WBC 7.6 09/21/2015 1635   RBC 5.31 (H) 01/18/2023 0919   RBC  5.11 09/29/2022 0000   HGB 14.9 01/18/2023 0919   HCT 46.0 01/18/2023 0919   PLT 240 01/18/2023 0919   MCV 87 01/18/2023 0919   MCH 28.1 01/18/2023 0919   MCH 27.2 09/21/2015 1635   MCHC 32.4 01/18/2023 0919   MCHC 32.3 09/21/2015 1635   RDW 14.6 01/18/2023 0919   Iron Studies No results found for: IRON, TIBC, FERRITIN, IRONPCTSAT Lipid Panel     Component Value Date/Time   CHOL 153 01/18/2023 0919   TRIG 130 01/18/2023 0919   HDL 48 01/18/2023 0919   CHOLHDL 4.8 (H) 05/31/2022 0822   LDLCALC 82 01/18/2023 0919   Hepatic Function Panel     Component Value Date/Time   PROT 6.9 01/18/2023 0919   ALBUMIN 4.5 01/18/2023 0919   AST 16 01/18/2023 0919   ALT 20 01/18/2023 0919   ALKPHOS 87 01/18/2023 0919   BILITOT 0.3 01/18/2023 0919      Component Value Date/Time   TSH 2.080 01/18/2023 0919   Nutritional Lab Results  Component Value Date   VD25OH 75.1 01/18/2023   VD25OH 50.9 05/31/2022   VD25OH 58.8 12/14/2021    Medications: Outpatient Encounter Medications as of 02/18/2024  Medication Sig   amLODipine  (NORVASC ) 2.5 MG tablet Take 2.5 mg by mouth daily.   blood glucose meter kit and supplies KIT Dispense based on patient and insurance preference. Use up to four times daily as directed.   Blood Glucose Monitoring Suppl DEVI 1 each by Does not apply route in the morning, at noon, and at bedtime. May substitute to any manufacturer covered by patient's insurance.   budesonide-formoterol (SYMBICORT) 160-4.5 MCG/ACT inhaler Inhale 1 puff into the lungs daily.   Cholecalciferol (VITAMIN D3) 25 MCG (1000 UT) CAPS 1,000 IU every other day   hyoscyamine (ANASPAZ) 0.125 MG TBDP tablet Place under the tongue as needed.   ketoconazole  (NIZORAL ) 2 % shampoo APPLY TO SCALP LET SIT FOR 3-5 MINUTES AND THEN RINSE.   losartan-hydrochlorothiazide (HYZAAR) 100-25 MG tablet Take 1 tablet by mouth daily.   mometasone  (ELOCON ) 0.1 % lotion Apply topically two to three times  weekly.   Multiple Vitamins-Minerals (MULTIVITAMIN PO) Take by mouth. Women over 40 vitamin   pantoprazole (PROTONIX) 40 MG tablet Take 40 mg by mouth daily as needed.   rosuvastatin (CRESTOR) 5 MG tablet Take 5 mg by mouth 2 (two) times a week.   tirzepatide  (MOUNJARO ) 5 MG/0.5ML Pen Inject 5 mg into the skin once a week.   No facility-administered encounter medications on file as of 02/18/2024.     Follow-Up   Return in about 4 weeks (around 03/17/2024) for For Weight Mangement with Dr. Francyne.SABRA She was informed of the importance of frequent follow up visits to maximize her success with intensive lifestyle modifications for her multiple health conditions.  Attestation Statement   Reviewed by clinician on day of visit: allergies, medications, problem list, medical history, surgical history, family history, social history, and previous encounter notes.     Lucas Francyne, MD

## 2024-02-27 DIAGNOSIS — K5903 Drug induced constipation: Secondary | ICD-10-CM | POA: Diagnosis not present

## 2024-02-27 DIAGNOSIS — M25551 Pain in right hip: Secondary | ICD-10-CM | POA: Diagnosis not present

## 2024-03-17 ENCOUNTER — Ambulatory Visit (INDEPENDENT_AMBULATORY_CARE_PROVIDER_SITE_OTHER): Admitting: Internal Medicine

## 2024-03-17 ENCOUNTER — Encounter (INDEPENDENT_AMBULATORY_CARE_PROVIDER_SITE_OTHER): Payer: Self-pay | Admitting: Internal Medicine

## 2024-03-17 VITALS — BP 144/82 | HR 59 | Temp 98.7°F | Ht 59.0 in | Wt 201.0 lb

## 2024-03-17 DIAGNOSIS — E1159 Type 2 diabetes mellitus with other circulatory complications: Secondary | ICD-10-CM | POA: Diagnosis not present

## 2024-03-17 DIAGNOSIS — E669 Obesity, unspecified: Secondary | ICD-10-CM

## 2024-03-17 DIAGNOSIS — Z6839 Body mass index (BMI) 39.0-39.9, adult: Secondary | ICD-10-CM

## 2024-03-17 DIAGNOSIS — E1169 Type 2 diabetes mellitus with other specified complication: Secondary | ICD-10-CM

## 2024-03-17 DIAGNOSIS — E66812 Obesity, class 2: Secondary | ICD-10-CM | POA: Diagnosis not present

## 2024-03-17 DIAGNOSIS — Z7985 Long-term (current) use of injectable non-insulin antidiabetic drugs: Secondary | ICD-10-CM | POA: Diagnosis not present

## 2024-03-17 DIAGNOSIS — I152 Hypertension secondary to endocrine disorders: Secondary | ICD-10-CM | POA: Diagnosis not present

## 2024-03-17 MED ORDER — TIRZEPATIDE 7.5 MG/0.5ML ~~LOC~~ SOAJ: 7.5000 mg | SUBCUTANEOUS | 1 refills | Status: DC

## 2024-03-17 NOTE — Assessment & Plan Note (Signed)
 Weight: decrease of 23 lb (10.3%) over 2 years, 7 months  Start: 08/09/2021 224 lb (101.6 kg)  End: 03/17/2024 201 lb (91.2 kg)  Obesity with stress-related eating and sleep disturbance Obesity with recent weight gain of one pound since last visit. Stress-related eating and sleep disturbance due to participation in multiple craft shows and family activities. Emotional hunger and stress eating identified as contributing factors. Current medication Mounjaro  5 mg is well-tolerated. Discussion on increasing Mounjaro  to 7.5 mg to enhance weight management. Emphasis on meal preparation and avoiding stress-related eating. Encouragement to recognize and manage emotional hunger triggers. She agreed to increase Mounjaro  dose after discussing potential benefits and previous issues with loose stools. - Increase Mounjaro  to 7.5 mg once a week for diabetes and weight management. - Educate on meal preparation and planning to avoid stress-related eating. - Encourage recognition and management of emotional hunger triggers. - Recommend strategies for stress management, including meditation and physical activity. - Provide handout on emotional hunger.

## 2024-03-17 NOTE — Progress Notes (Signed)
 Office: [phone]  /  Fax: [phone]  Weight Summary and Body Composition Analysis (BIA)  Vitals Temp: 98.7 F (37.1 C) BP: (!) 144/82 Pulse Rate: (!) 59 SpO2: 95 %   Anthropometric Measurements Height: 4' 11 (1.499 m) Weight: 201 lb (91.2 kg) BMI (Calculated): 40.58 Weight at Last Visit: 200lb Weight Lost Since Last Visit: 0lb Weight Gained Since Last Visit: 1lb Starting Weight: 224lb Total Weight Loss (lbs): 23 lb (10.4 kg) Peak Weight: 234lb   Body Composition  Body Fat %: 49.3 % Fat Mass (lbs): 99.4 lbs Muscle Mass (lbs): 97.2 lbs Total Body Water (lbs): 79.8 lbs Visceral Fat Rating : 17    RMR: 1843  Today's Visit #: 40  Starting Date: 08/09/21   Subjective   Chief Complaint: Obesity  Interval History Discussed the use of AI scribe software for clinical note transcription with the patient, who gave verbal consent to proceed.  History of Present Illness Megan [name] is a 73 year old female who presents for medical weight management.  She is following a 1200-calorie nutrition plan.  She is exercising 4 days a week doing 30 minutes of walking.  She has gained one pound since her last visit, attributing this to increased stress from participating in multiple craft shows. This stress has adversely affected her sleep and eating habits, resulting in poor sleep quality with scores dropping from the seventies and eighties to the twenties and thirties.  She is currently on Mounjaro  5 mg, which she restarted in July after a previous trial from September to November 2024. She tolerates the medication well. She also takes a supplement containing magnesium, vitamin C, B, D, and K1 to address leg cramps, which she associates with her blood pressure medication, losartan hydrochlorothiazide. The magnesium has helped alleviate the cramps, which occur in her inner thighs and disturb her sleep.  Her blood pressure is elevated today, which she attributes  to stress and missing her medication dose yesterday and today. Previously, her blood pressure had been regular. She is not currently taking amlodipine  due to previous side effects of leg cramps.  She faces challenges with emotional eating, particularly during times of stress, and tends to eat on the fly without meal planning. She prefers ground chicken and has recently purchased six pounds to aid in meal preparation. There is an increase in eating out and late meals due to her busy schedule.  She has increased her physical activity, averaging 6,900 steps per day, although this is not consistent. She has also started doing leg lifts while sitting to increase her activity level.     Challenges affecting patient progress: medical comorbidities, inadequate sleep , moderate to high levels of stress, menopause, and emotional eating.    Pharmacotherapy for weight management: She is currently taking Monjauro with diabetes as the primary indication and obesity secondary with adequate clinical response  and without side effects..   Assessment and Plan   Treatment Plan For Obesity:  Recommended Dietary Goals  Megan [name] is currently in the action stage of change. As such, her goal is to continue weight management plan. She has agreed to: incorporate prepackaged healthy meals for convenience, incorporate 1-2 meal replacements a day for convenience , and continue current plan  Behavioral Health and Counseling  We discussed the following behavioral modification strategies today: keeping healthy foods at home, practice mindfulness eating and understand the difference between hunger signals and cravings, work on managing stress, creating time for self-care and relaxation, avoiding temptations and identifying  enticing environmental cues, continue to work on maintaining a reduced calorie state, getting the recommended amount of protein, incorporating whole foods, making healthy choices, staying well hydrated and  practicing mindfulness when eating., and increase protein intake, fibrous foods (25 grams per day for women, 30 grams for men) and water to improve satiety and decrease hunger signals. .  Additional education and resources provided today: [facility] handout on emotional eating  Recommended Physical Activity Goals  Megan [name] has been advised to work up to 150 minutes of moderate intensity aerobic activity a week and strengthening exercises 2-3 times per week for cardiovascular health, weight loss maintenance and preservation of muscle mass.  She has agreed to :  Increase volume of physical activity to a goal of 240 minutes a week and Combine aerobic and strengthening exercises for efficiency and improved cardiometabolic health.  Medical Interventions and Pharmacotherapy  We discussed various medication options to help Megan [name] with her weight loss efforts and we both agreed to : Increase Mounjaro  to 7.5 mg once a week and Patient was counseled on the importance of maintaining healthy lifestyle habits, including balanced nutrition, regular physical activity, and behavioral modifications, while taking antiobesity medication.  Patient verbalized understanding that medication is an adjunct to, not a replacement for, lifestyle changes and that the long-term success and weight maintenance depend on continued adherence to these strategies.  Associated Conditions Impacted by Obesity Treatment  Assessment & Plan Hypertension associated with type 2 diabetes mellitus (HCC) Elevated blood pressure likely due to stress and missed doses of losartan/hydrochlorothiazide. Current regimen includes maximum dose of losartan/hydrochlorothiazide.  She is no longer on amlodipine .  Emphasis on adherence to medication regimen to prevent further elevation of blood pressure. - Ensure adherence to losartan/hydrochlorothiazide regimen. - Monitor blood pressure regularly. - Address stress management to prevent further elevation  of blood pressure. Class 2 severe obesity with serious comorbidity and body mass index (BMI) of 39.0 to 39.9 in adult, unspecified obesity type Weight: decrease of 23 lb (10.3%) over 2 years, 7 months  Start: 08/09/2021 224 lb (101.6 kg)  End: 03/17/2024 201 lb (91.2 kg)  Obesity with stress-related eating and sleep disturbance Obesity with recent weight gain of one pound since last visit. Stress-related eating and sleep disturbance due to participation in multiple craft shows and family activities. Emotional hunger and stress eating identified as contributing factors. Current medication Mounjaro  5 mg is well-tolerated. Discussion on increasing Mounjaro  to 7.5 mg to enhance weight management. Emphasis on meal preparation and avoiding stress-related eating. Encouragement to recognize and manage emotional hunger triggers. She agreed to increase Mounjaro  dose after discussing potential benefits and previous issues with loose stools. - Increase Mounjaro  to 7.5 mg once a week for diabetes and weight management. - Educate on meal preparation and planning to avoid stress-related eating. - Encourage recognition and management of emotional hunger triggers. - Recommend strategies for stress management, including meditation and physical activity. - Provide handout on emotional hunger. Type 2 diabetes mellitus in patient with obesity (HCC) HgbA1c is at goal for age and comorbid conditions. Denies symptoms of hypoglycemia or hyperglycemia. On Mounjaro  with good adherence and no side effects.  We will increase medication to 7.5 mg once a week.  She has some tolerability issues in the past with the 5 but is tolerating medication well with nutritional changes.  Counseled on goals of care, monitoring for complications and importance of staying updated on immunizations and diabetes preventive measures. Continue with reduced calorie meal plan low on processed crabs  and simple sugars. Ongoing weight loss will improve  insulin  resistance and glycemic control  Lab Results  Component Value Date   HGBA1C 5.8 (H) 01/18/2023   HGBA1C 5.9 09/29/2022   HGBA1C 6.5 (H) 05/31/2022   Lab Results  Component Value Date   LDLCALC 82 01/18/2023   CREATININE 0.70 07/27/2023   Patient may be overdue for disease monitoring labs last ones I have on file are from [facility] and they are dated from November of last year.  We will review records further and determine if she needs blood work at the next office visit or not..       Objective   Physical Exam:  Blood pressure (!) 144/82, pulse (!) 59, temperature 98.7 F (37.1 C), height 4' 11 (1.499 m), weight 201 lb (91.2 kg), last menstrual period 09/20/2011, SpO2 95%. Body mass index is 40.6 kg/m.  General: She is overweight, cooperative, alert, well developed, and in no acute distress. PSYCH: Has normal mood, affect and thought process.   HEENT: EOMI, sclerae are anicteric. Lungs: Normal breathing effort, no conversational dyspnea. Extremities: No edema.  Neurologic: No gross sensory or motor deficits. No tremors or fasciculations noted.    Diagnostic Data Reviewed:  BMET    Component Value Date/Time   NA 139 01/18/2023 0919   K 3.8 01/18/2023 0919   CL 101 01/18/2023 0919   CO2 23 01/18/2023 0919   GLUCOSE 92 01/18/2023 0919   GLUCOSE 103 (H) 09/21/2015 1635   BUN 26 01/18/2023 0919   CREATININE 0.70 07/27/2023 0857   CALCIUM 9.9 01/18/2023 0919   GFRNONAA >60 09/21/2015 1635   GFRAA >60 09/21/2015 1635   Lab Results  Component Value Date   HGBA1C 5.8 (H) 01/18/2023   HGBA1C 6.4 (H) 08/09/2021   Lab Results  Component Value Date   INSULIN  17.2 01/18/2023   INSULIN  29.6 (H) 08/09/2021   Lab Results  Component Value Date   TSH 2.080 01/18/2023   CBC    Component Value Date/Time   WBC 7.0 01/18/2023 0919   WBC 7.6 09/21/2015 1635   RBC 5.31 (H) 01/18/2023 0919   RBC 5.11 09/29/2022 0000   HGB 14.9 01/18/2023 0919    HCT 46.0 01/18/2023 0919   PLT 240 01/18/2023 0919   MCV 87 01/18/2023 0919   MCH 28.1 01/18/2023 0919   MCH 27.2 09/21/2015 1635   MCHC 32.4 01/18/2023 0919   MCHC 32.3 09/21/2015 1635   RDW 14.6 01/18/2023 0919   Iron Studies No results found for: IRON, TIBC, FERRITIN, IRONPCTSAT Lipid Panel     Component Value Date/Time   CHOL 153 01/18/2023 0919   TRIG 130 01/18/2023 0919   HDL 48 01/18/2023 0919   CHOLHDL 4.8 (H) 05/31/2022 0822   LDLCALC 82 01/18/2023 0919   Hepatic Function Panel     Component Value Date/Time   PROT 6.9 01/18/2023 0919   ALBUMIN 4.5 01/18/2023 0919   AST 16 01/18/2023 0919   ALT 20 01/18/2023 0919   ALKPHOS 87 01/18/2023 0919   BILITOT 0.3 01/18/2023 0919      Component Value Date/Time   TSH 2.080 01/18/2023 0919   Nutritional Lab Results  Component Value Date   VD25OH 75.1 01/18/2023   VD25OH 50.9 05/31/2022   VD25OH 58.8 12/14/2021    Medications: Outpatient Encounter Medications as of 03/17/2024  Medication Sig   amLODipine  (NORVASC ) 2.5 MG tablet Take 2.5 mg by mouth daily.   blood glucose meter kit and supplies KIT Dispense  based on patient and insurance preference. Use up to four times daily as directed.   Blood Glucose Monitoring Suppl DEVI 1 each by Does not apply route in the morning, at noon, and at bedtime. May substitute to any manufacturer covered by patient's insurance.   budesonide-formoterol (SYMBICORT) 160-4.5 MCG/ACT inhaler Inhale 1 puff into the lungs daily.   Cholecalciferol (VITAMIN D3) 25 MCG (1000 UT) CAPS 1,000 IU every other day   hyoscyamine (ANASPAZ) 0.125 MG TBDP tablet Place under the tongue as needed.   ketoconazole  (NIZORAL ) 2 % shampoo APPLY TO SCALP LET SIT FOR 3-5 MINUTES AND THEN RINSE.   losartan-hydrochlorothiazide (HYZAAR) 100-25 MG tablet Take 1 tablet by mouth daily.   mometasone  (ELOCON ) 0.1 % lotion Apply topically two to three times weekly.   Multiple Vitamins-Minerals (MULTIVITAMIN  PO) Take by mouth. Women over 40 vitamin   pantoprazole (PROTONIX) 40 MG tablet Take 40 mg by mouth daily as needed.   rosuvastatin (CRESTOR) 5 MG tablet Take 5 mg by mouth 2 (two) times a week.   tirzepatide  (MOUNJARO ) 7.5 MG/0.5ML Pen Inject 7.5 mg into the skin once a week.   [DISCONTINUED] tirzepatide  (MOUNJARO ) 5 MG/0.5ML Pen Inject 5 mg into the skin once a week.   No facility-administered encounter medications on file as of 03/17/2024.     Follow-Up   Return in about 4 weeks (around 04/14/2024) for For Weight Mangement with Dr. Francyne.SABRA She was informed of the importance of frequent follow up visits to maximize her success with intensive lifestyle modifications for her multiple health conditions.  Attestation Statement   Reviewed by clinician on day of visit: allergies, medications, problem list, medical history, surgical history, family history, social history, and previous encounter notes.     Lucas Francyne, MD

## 2024-03-17 NOTE — Assessment & Plan Note (Signed)
 Elevated blood pressure likely due to stress and missed doses of losartan/hydrochlorothiazide. Current regimen includes maximum dose of losartan/hydrochlorothiazide.  She is no longer on amlodipine .  Emphasis on adherence to medication regimen to prevent further elevation of blood pressure. - Ensure adherence to losartan/hydrochlorothiazide regimen. - Monitor blood pressure regularly. - Address stress management to prevent further elevation of blood pressure.

## 2024-03-17 NOTE — Assessment & Plan Note (Signed)
 HgbA1c is at goal for age and comorbid conditions. Denies symptoms of hypoglycemia or hyperglycemia. On Mounjaro  with good adherence and no side effects.  We will increase medication to 7.5 mg once a week.  She has some tolerability issues in the past with the 5 but is tolerating medication well with nutritional changes.  Counseled on goals of care, monitoring for complications and importance of staying updated on immunizations and diabetes preventive measures. Continue with reduced calorie meal plan low on processed crabs and simple sugars. Ongoing weight loss will improve insulin  resistance and glycemic control  Lab Results  Component Value Date   HGBA1C 5.8 (H) 01/18/2023   HGBA1C 5.9 09/29/2022   HGBA1C 6.5 (H) 05/31/2022   Lab Results  Component Value Date   LDLCALC 82 01/18/2023   CREATININE 0.70 07/27/2023   Patient may be overdue for disease monitoring labs last ones I have on file are from [facility] and they are dated from November of last year.  We will review records further and determine if she needs blood work at the next office visit or not.Megan [name]

## 2024-04-07 ENCOUNTER — Encounter (INDEPENDENT_AMBULATORY_CARE_PROVIDER_SITE_OTHER): Payer: Self-pay | Admitting: Internal Medicine

## 2024-04-07 ENCOUNTER — Ambulatory Visit (INDEPENDENT_AMBULATORY_CARE_PROVIDER_SITE_OTHER): Payer: Self-pay | Admitting: Internal Medicine

## 2024-04-07 VITALS — BP 128/82 | HR 57 | Temp 97.5°F | Ht 59.0 in | Wt 196.0 lb

## 2024-04-07 DIAGNOSIS — Z6839 Body mass index (BMI) 39.0-39.9, adult: Secondary | ICD-10-CM | POA: Diagnosis not present

## 2024-04-07 DIAGNOSIS — E66812 Obesity, class 2: Secondary | ICD-10-CM | POA: Diagnosis not present

## 2024-04-07 DIAGNOSIS — I152 Hypertension secondary to endocrine disorders: Secondary | ICD-10-CM

## 2024-04-07 DIAGNOSIS — E669 Obesity, unspecified: Secondary | ICD-10-CM

## 2024-04-07 DIAGNOSIS — E1159 Type 2 diabetes mellitus with other circulatory complications: Secondary | ICD-10-CM | POA: Diagnosis not present

## 2024-04-07 DIAGNOSIS — Z7985 Long-term (current) use of injectable non-insulin antidiabetic drugs: Secondary | ICD-10-CM

## 2024-04-07 MED ORDER — TIRZEPATIDE 7.5 MG/0.5ML ~~LOC~~ SOAJ
7.5000 mg | SUBCUTANEOUS | 1 refills | Status: AC
Start: 2024-04-07 — End: ?

## 2024-04-07 NOTE — Assessment & Plan Note (Signed)
 Vitals:   04/07/24 1000  BP: 128/82    Blood pressure is at goal for age and risk category.  On losartan hydrochlorothiazide without adverse effects.  Most recent renal parameters reviewed which showed stable electrolytes and kidney function.  Continue with weight loss therapy. Losing 10% may improve blood pressure control. Monitor for symptoms of orthostasis while losing weight. Continue current regimen and home monitoring for a goal blood pressure of < 120/80.

## 2024-04-07 NOTE — Assessment & Plan Note (Signed)
 HgbA1c is at goal for age and comorbid conditions. Denies symptoms of hypoglycemia or hyperglycemia. On Mounjaro  with good adherence and no side effects.     Lab Results  Component Value Date   HGBA1C 5.8 (H) 01/18/2023   HGBA1C 5.9 09/29/2022   HGBA1C 6.5 (H) 05/31/2022   Lab Results  Component Value Date   LDLCALC 82 01/18/2023   CREATININE 0.70 07/27/2023   Continue current weight management strategy

## 2024-04-07 NOTE — Assessment & Plan Note (Signed)
 Weight: decrease of 28 lb (12.5%) over 2 years, 7 months  Start: 08/09/2021 224 lb (101.6 kg)  End: 04/07/2024 196 lb (88.9 kg)   Management is ongoing with significant weight loss of five pounds since the last visit. She adheres to dietary recommendations, increases physical activity, and uses Mounjaro  7.5 mg with reduced appetite and portion sizes. No significant side effects from Mounjaro , except occasional nausea and constipation. Current dose is effective for weight loss without muscle loss. - Continue Mounjaro  7.5 mg. - Continue reduced calorie nutrition plan - Encouraged continued physical activity and dietary modifications. - Provided handout on holiday weight management tips. - Scheduled follow-up in four weeks.

## 2024-04-07 NOTE — Progress Notes (Signed)
 Office: [phone]  /  Fax: [phone]  Weight Summary and Body Composition Analysis (BIA)  Vitals Temp: (!) 97.5 F (36.4 C) BP: 128/82 Pulse Rate: (!) 57 SpO2: 98 %   Anthropometric Measurements Height: 4' 11 (1.499 m) Weight: 196 lb (88.9 kg) BMI (Calculated): 39.57 Weight at Last Visit: 201 lb Weight Lost Since Last Visit: 5 lb Weight Gained Since Last Visit: 0 lb Starting Weight: 224 lb Total Weight Loss (lbs): 28 lb (12.7 kg) Peak Weight: 234 lb   Body Composition  Body Fat %: 48.4 % Fat Mass (lbs): 95.2 lbs Muscle Mass (lbs): 96.4 lbs Total Body Water (lbs): 76.8 lbs Visceral Fat Rating : 17    RMR: 1843  Today's Visit #: 41  Starting Date: 08/09/21   Subjective   Chief Complaint: Obesity  Interval History Discussed the use of AI scribe software for clinical note transcription with the patient, who gave verbal consent to proceed.  History of Present Illness Megan [name] is a 74 year old female who presents for medical weight management.  She has lost five pounds since her last visit, attributing this success to lifestyle changes such as increasing her daily steps to over 7,000 and using a program to track her food intake. She has not yet tried the protein shakes she purchased but has been incorporating cleaner food options into her diet. Her weight has been on a downward trend since March 2023, reaching a low of 190 pounds in 2024 before regaining some weight. She is now approaching her previous low weight again.  She experiences constipation as a side effect of her current medication, which she manages with a plant-based vitamin supplement containing magnesium, vitamin K, and vitamin D . She occasionally experiences mild nausea but notes a significant reduction in her appetite and portion sizes. She is currently on a dose of 7.5 mg of Mounjaro .  She experiences cramps in her lower legs at night, which she manages with magnesium  supplements and stretching exercises. She continues to engage in physical activities and is motivated to maintain her progress, aiming to increase her activity levels as she loses more weight.  Her social history includes a background in physical education and a current interest in activities like pickleball and golf. Her husband is a retired Optometrist.     Challenges affecting patient progress: medical comorbidities and menopause.    Pharmacotherapy for weight management: She is currently taking Monjauro with diabetes as the primary indication and obesity secondary with adequate clinical response  and without side effects..   Assessment and Plan   Treatment Plan For Obesity:  Recommended Dietary Goals  Megan [name] is currently in the action stage of change. As such, her goal is to continue weight management plan. She has agreed to: continue current plan  Behavioral Health and Counseling  We discussed the following behavioral modification strategies today: continue to work on maintaining a reduced calorie state, getting the recommended amount of protein, incorporating whole foods, making healthy choices, staying well hydrated and practicing mindfulness when eating. and increase protein intake, fibrous foods (25 grams per day for women, 30 grams for men) and water to improve satiety and decrease hunger signals. .  Additional education and resources provided today: Handout on traveling and holiday eating strategies  Recommended Physical Activity Goals  Megan [name] has been advised to work up to 150 minutes of moderate intensity aerobic activity a week and strengthening exercises 2-3 times per week for cardiovascular health, weight loss maintenance and preservation  of muscle mass.  She has agreed to :  Increase volume of physical activity to a goal of 240 minutes a week and Combine aerobic and strengthening exercises for efficiency and improved cardiometabolic health.  Medical Interventions and  Pharmacotherapy  We discussed various medication options to help Megan [name] with her weight loss efforts and we both agreed to : Adequate clinical response to anti-obesity medication, continue current regimen  Associated Conditions Impacted by Obesity Treatment  Assessment & Plan Class 2 severe obesity with serious comorbidity and body mass index (BMI) of 39.0 to 39.9 in adult, unspecified obesity type Weight: decrease of 28 lb (12.5%) over 2 years, 7 months  Start: 08/09/2021 224 lb (101.6 kg)  End: 04/07/2024 196 lb (88.9 kg)   Management is ongoing with significant weight loss of five pounds since the last visit. She adheres to dietary recommendations, increases physical activity, and uses Mounjaro  7.5 mg with reduced appetite and portion sizes. No significant side effects from Mounjaro , except occasional nausea and constipation. Current dose is effective for weight loss without muscle loss. - Continue Mounjaro  7.5 mg. - Continue reduced calorie nutrition plan - Encouraged continued physical activity and dietary modifications. - Provided handout on holiday weight management tips. - Scheduled follow-up in four weeks. Type 2 diabetes mellitus in patient with obesity (HCC) HgbA1c is at goal for age and comorbid conditions. Denies symptoms of hypoglycemia or hyperglycemia. On Mounjaro  with good adherence and no side effects.     Lab Results  Component Value Date   HGBA1C 5.8 (H) 01/18/2023   HGBA1C 5.9 09/29/2022   HGBA1C 6.5 (H) 05/31/2022   Lab Results  Component Value Date   LDLCALC 82 01/18/2023   CREATININE 0.70 07/27/2023   Continue current weight management strategy  Hypertension associated with type 2 diabetes mellitus (HCC) Vitals:   04/07/24 1000  BP: 128/82    Blood pressure is at goal for age and risk category.  On losartan hydrochlorothiazide without adverse effects.  Most recent renal parameters reviewed which showed stable electrolytes and kidney function.  Continue  with weight loss therapy. Losing 10% may improve blood pressure control. Monitor for symptoms of orthostasis while losing weight. Continue current regimen and home monitoring for a goal blood pressure of < 120/80.            Objective   Physical Exam:  Blood pressure 128/82, pulse (!) 57, temperature (!) 97.5 F (36.4 C), height 4' 11 (1.499 m), weight 196 lb (88.9 kg), last menstrual period 09/20/2011, SpO2 98%. Body mass index is 39.59 kg/m.  General: She is overweight, cooperative, alert, well developed, and in no acute distress. PSYCH: Has normal mood, affect and thought process.   HEENT: EOMI, sclerae are anicteric. Lungs: Normal breathing effort, no conversational dyspnea. Extremities: No edema.  Neurologic: No gross sensory or motor deficits. No tremors or fasciculations noted.    Diagnostic Data Reviewed:  BMET    Component Value Date/Time   NA 139 01/18/2023 0919   K 3.8 01/18/2023 0919   CL 101 01/18/2023 0919   CO2 23 01/18/2023 0919   GLUCOSE 92 01/18/2023 0919   GLUCOSE 103 (H) 09/21/2015 1635   BUN 26 01/18/2023 0919   CREATININE 0.70 07/27/2023 0857   CALCIUM 9.9 01/18/2023 0919   GFRNONAA >60 09/21/2015 1635   GFRAA >60 09/21/2015 1635   Lab Results  Component Value Date   HGBA1C 5.8 (H) 01/18/2023   HGBA1C 6.4 (H) 08/09/2021   Lab Results  Component Value Date  INSULIN  17.2 01/18/2023   INSULIN  29.6 (H) 08/09/2021   Lab Results  Component Value Date   TSH 2.080 01/18/2023   CBC    Component Value Date/Time   WBC 7.0 01/18/2023 0919   WBC 7.6 09/21/2015 1635   RBC 5.31 (H) 01/18/2023 0919   RBC 5.11 09/29/2022 0000   HGB 14.9 01/18/2023 0919   HCT 46.0 01/18/2023 0919   PLT 240 01/18/2023 0919   MCV 87 01/18/2023 0919   MCH 28.1 01/18/2023 0919   MCH 27.2 09/21/2015 1635   MCHC 32.4 01/18/2023 0919   MCHC 32.3 09/21/2015 1635   RDW 14.6 01/18/2023 0919   Iron Studies No results found for: IRON, TIBC, FERRITIN,  IRONPCTSAT Lipid Panel     Component Value Date/Time   CHOL 153 01/18/2023 0919   TRIG 130 01/18/2023 0919   HDL 48 01/18/2023 0919   CHOLHDL 4.8 (H) 05/31/2022 0822   LDLCALC 82 01/18/2023 0919   Hepatic Function Panel     Component Value Date/Time   PROT 6.9 01/18/2023 0919   ALBUMIN 4.5 01/18/2023 0919   AST 16 01/18/2023 0919   ALT 20 01/18/2023 0919   ALKPHOS 87 01/18/2023 0919   BILITOT 0.3 01/18/2023 0919      Component Value Date/Time   TSH 2.080 01/18/2023 0919   Nutritional Lab Results  Component Value Date   VD25OH 75.1 01/18/2023   VD25OH 50.9 05/31/2022   VD25OH 58.8 12/14/2021    Medications: Outpatient Encounter Medications as of 04/07/2024  Medication Sig   amLODipine  (NORVASC ) 2.5 MG tablet Take 2.5 mg by mouth daily.   blood glucose meter kit and supplies KIT Dispense based on patient and insurance preference. Use up to four times daily as directed.   Blood Glucose Monitoring Suppl DEVI 1 each by Does not apply route in the morning, at noon, and at bedtime. May substitute to any manufacturer covered by patient's insurance.   budesonide-formoterol (SYMBICORT) 160-4.5 MCG/ACT inhaler Inhale 1 puff into the lungs daily.   Cholecalciferol (VITAMIN D3) 25 MCG (1000 UT) CAPS 1,000 IU every other day   hyoscyamine (ANASPAZ) 0.125 MG TBDP tablet Place under the tongue as needed.   ketoconazole  (NIZORAL ) 2 % shampoo APPLY TO SCALP LET SIT FOR 3-5 MINUTES AND THEN RINSE.   losartan-hydrochlorothiazide (HYZAAR) 100-25 MG tablet Take 1 tablet by mouth daily.   mometasone  (ELOCON ) 0.1 % lotion Apply topically two to three times weekly.   Multiple Vitamins-Minerals (MULTIVITAMIN PO) Take by mouth. Women over 40 vitamin   pantoprazole (PROTONIX) 40 MG tablet Take 40 mg by mouth daily as needed.   rosuvastatin (CRESTOR) 5 MG tablet Take 5 mg by mouth 2 (two) times a week.   tirzepatide  (MOUNJARO ) 7.5 MG/0.5ML Pen Inject 7.5 mg into the skin once a week.    [DISCONTINUED] tirzepatide  (MOUNJARO ) 7.5 MG/0.5ML Pen Inject 7.5 mg into the skin once a week.   No facility-administered encounter medications on file as of 04/07/2024.     Follow-Up   Return in about 4 weeks (around 05/05/2024) for For Weight Mangement with Dr. Francyne.SABRA She was informed of the importance of frequent follow up visits to maximize her success with intensive lifestyle modifications for her multiple health conditions.  Attestation Statement   Reviewed by clinician on day of visit: allergies, medications, problem list, medical history, surgical history, family history, social history, and previous encounter notes.     Lucas Francyne, MD

## 2024-04-29 ENCOUNTER — Ambulatory Visit (HOSPITAL_BASED_OUTPATIENT_CLINIC_OR_DEPARTMENT_OTHER): Admitting: Obstetrics & Gynecology

## 2024-04-29 NOTE — Progress Notes (Deleted)
   GYNECOLOGY  VISIT  CC:   No chief complaint on file.   HPI: 73 y.o. G28P2002 Married White or Caucasian female here for breast pain.  Patient's last menstrual period was 09/20/2011 (approximate).  Past Medical History:  Diagnosis Date   Abnormal Pap smear of cervix    just a repeat done yrs ago   Anemia    Asthma    Back pain    Basal cell carcinoma 02/07/2018   right nasal sidewall [place] MOHS   Bilateral edema of lower extremity    Cancer (HCC) 04/24/2018   Nose   Constipation    Diverticulosis    Gallstones    Gastritis    acute   GERD (gastroesophageal reflux disease)    Hiatal hernia    Hip pain    Knee pain    Osteoarthritis    Other fatigue    Shortness of breath on exertion    Vitamin D  deficiency     MEDS:  Reviewed in EPIC  ALLERGIES: Metformin  and related, Augmentin [amoxicillin-pot clavulanate], Erythromycin base, Guaifenesin & derivatives, Hydrocodeine [dihydrocodeine], Metronidazole , and Nexium [esomeprazole magnesium]  SH:  ***  ROS  PHYSICAL EXAMINATION:    LMP 09/20/2011 (Approximate)     General appearance: alert, cooperative and appears stated age Neck: no adenopathy, supple, symmetrical, trachea midline and thyroid  {CHL AMB PHY EX THYROID  NORM DEFAULT:[id number]::normal to inspection and palpation} CV:  {Exam; heart brief:31539} Lungs:  {pe lungs ob:314451} Breasts: {Exam; breast:13139::normal appearance, no masses or tenderness} Abdomen: soft, non-tender; bowel sounds normal; no masses,  no organomegaly Lymph:  no inguinal LAD noted  Pelvic: External genitalia:  no lesions              Urethra:  normal appearing urethra with no masses, tenderness or lesions              Bartholins and Skenes: normal                 Vagina: {exam; pelvic vaginal:30846}              Cervix: {CHL AMB PHY EX CERVIX NORM [phone]::no lesions}              Bimanual Exam:  Uterus:  {CHL AMB PHY EX UTERUS NORM DEFAULT:[id number]::normal size,  contour, position, consistency, mobility, non-tender}              Adnexa: {CHL AMB PHY EX ADNEXA NO MASS DEFAULT:[phone]::no mass, fullness, tenderness}              Rectovaginal: {yes no:314532}.  Confirms.              Anus:  normal sphincter tone, no lesions  Chaperone was present for exam.  Assessment/Plan: There are no diagnoses linked to this encounter.

## 2024-04-30 ENCOUNTER — Encounter: Payer: Self-pay | Admitting: Podiatry

## 2024-04-30 ENCOUNTER — Ambulatory Visit: Admitting: Podiatry

## 2024-04-30 DIAGNOSIS — M2041 Other hammer toe(s) (acquired), right foot: Secondary | ICD-10-CM

## 2024-04-30 NOTE — Progress Notes (Signed)
 Subjective:   Patient ID: Megan [name], female   DOB: 73 y.o.   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  Patient is interested in having this done and I allowed her to read then signed consent form today going over with her what would be required and the recovery and it would probably take in the vicinity of 6 months for all swelling to get out of her toe.  She is willing to undergo this wants to do this and she signed consent form we will look at her schedule and call us  to discuss the correction and to schedule surgery

## 2024-05-01 ENCOUNTER — Ambulatory Visit (INDEPENDENT_AMBULATORY_CARE_PROVIDER_SITE_OTHER): Admitting: Internal Medicine

## 2024-05-01 ENCOUNTER — Encounter (INDEPENDENT_AMBULATORY_CARE_PROVIDER_SITE_OTHER): Payer: Self-pay | Admitting: Internal Medicine

## 2024-05-01 VITALS — BP 132/80 | HR 64 | Temp 98.6°F | Ht 59.0 in | Wt 196.0 lb

## 2024-05-01 DIAGNOSIS — K5900 Constipation, unspecified: Secondary | ICD-10-CM | POA: Diagnosis not present

## 2024-05-01 DIAGNOSIS — E1169 Type 2 diabetes mellitus with other specified complication: Secondary | ICD-10-CM

## 2024-05-01 DIAGNOSIS — Z6839 Body mass index (BMI) 39.0-39.9, adult: Secondary | ICD-10-CM

## 2024-05-01 DIAGNOSIS — Z7985 Long-term (current) use of injectable non-insulin antidiabetic drugs: Secondary | ICD-10-CM | POA: Diagnosis not present

## 2024-05-01 DIAGNOSIS — E66812 Obesity, class 2: Secondary | ICD-10-CM | POA: Diagnosis not present

## 2024-05-01 DIAGNOSIS — E669 Obesity, unspecified: Secondary | ICD-10-CM

## 2024-05-01 MED ORDER — TIRZEPATIDE 5 MG/0.5ML ~~LOC~~ SOAJ: 5.0000 mg | SUBCUTANEOUS | 0 refills | Status: DC

## 2024-05-01 NOTE — Assessment & Plan Note (Signed)
 Experiencing constipation likely due to Mounjaro . Reports not having a bowel movement for a week and using magnesium citrate. Advised to increase fiber intake and maintain hydration. Black stools noted, but not concerning as they are not black like tar and are more brown. Recent colonoscopy was normal. - Continue Miralax  for constipation management. - Increase fiber intake through diet. - Ensure adequate hydration. - Monitor stool color and consistency; if black stools persist, consult PCP.

## 2024-05-01 NOTE — Assessment & Plan Note (Signed)
 HgbA1c is at goal for age and comorbid conditions. Denies symptoms of hypoglycemia or hyperglycemia. On Mounjaro  with good adherence but is experiencing constipation at 7.5 mg.   Lab Results  Component Value Date   HGBA1C 5.8 (H) 01/18/2023   HGBA1C 5.9 09/29/2022   HGBA1C 6.5 (H) 05/31/2022   Lab Results  Component Value Date   LDLCALC 82 01/18/2023   CREATININE 0.70 07/27/2023   Reduce Mounjaro  to 5 mg once a week.  We discussed increasing fiber in diet

## 2024-05-01 NOTE — Assessment & Plan Note (Signed)
 Weight: decrease of 28 lb (12.5%) over 2 years, 7 months  Start: 08/09/2021 224 lb (101.6 kg)  End: 04/07/2024 196 lb (88.9 kg)  Experiencing nausea and constipation with Mounjaro  7.5 mg, indicating the dose may be too high. Previously tolerated 5 mg well without side effects. Weight has decreased to 190 lbs, showing progress in weight management. Emphasized the importance of maintaining weight during the holidays and incorporating high fiber foods to aid in weight management and prevent constipation. - Reduced Mounjaro  dosage to 5 mg. - Encouraged increased intake of high fiber foods such as chia seeds, lentils, beans, oats, whole wheat pasta, avocados, berries, pears, carrots, chickpeas, almonds, and leafy greens. - Encouraged adequate water intake. - Continue using Miralax  for constipation management.

## 2024-05-01 NOTE — Progress Notes (Signed)
 Office: [phone]  /  Fax: [phone]  Weight Summary and Body Composition Analysis (BIA)  Vitals Temp: 98.6 F (37 C) BP: 132/80 Pulse Rate: 64 SpO2: 96 %   Anthropometric Measurements Height: 4' 11 (1.499 m) Weight: 196 lb (88.9 kg) BMI (Calculated): 39.57 Weight at Last Visit: 196 lb Weight Lost Since Last Visit: 0 lb Weight Gained Since Last Visit: 0 lb Starting Weight: 224 lb Total Weight Loss (lbs): 28 lb (12.7 kg) Peak Weight: 234 lb   Body Composition  Body Fat %: 48.3 % Fat Mass (lbs): 94.8 lbs Muscle Mass (lbs): 96.4 lbs Total Body Water (lbs): 79.2 lbs Visceral Fat Rating : 17    RMR: 1843  Today's Visit #: 42  Starting Date: 08/09/21   Subjective   Chief Complaint: Obesity  Interval History Discussed the use of AI scribe software for clinical note transcription with the patient, who gave verbal consent to proceed.  History of Present Illness Megan [name] is a 73 year old female with diabetes and hypertension who presents for medical weight management.  Since last office visit she has maintained.  She is following a 1200-calorie nutrition plan 50% of the time she is exercising 7 days a week 30 minutes.  She is experiencing nausea and constipation, which she attributes to the 7.5 mg dose of Mounjaro . Initially, she felt fine but then suddenly becomes very nauseous. She also notes severe constipation, stating she had not had a bowel movement in a week until she used magnesium citrate. Previously, she experienced loose stools, making the current constipation unusual for her.  She has a history of slow bowels and irregularity since childhood. She drinks water or decaf tea regularly but does not consume enough fiber. Her stools are described as 'pasty' and 'dark brown,' not black. She denies taking iron or Pepto Bismol. She had a colonoscopy two years ago with normal results.  Her weight had decreased to 192 pounds, and she was hoping  to reach 190 pounds by this visit. She mentions maintaining her weight over the holidays and is working on getting back on track with her weight management goals. She averages 7,528 steps per day, with some days reaching up to 9,700 steps. She feels good during her walks and notes feeling lighter and fitting into smaller clothing sizes.  She is due for blood work in January.     Challenges affecting patient progress: none.    Pharmacotherapy for weight management: She is currently taking Monjauro with diabetes as the primary indication and obesity secondary with adequate clinical response  and experiencing the following side effects: constipation.   Assessment and Plan   Treatment Plan For Obesity:  Recommended Dietary Goals  Megan [name] is currently in the action stage of change. As such, her goal is to continue weight management plan. She has agreed to: continue current plan  Behavioral Health and Counseling  We discussed the following behavioral modification strategies today: continue to work on maintaining a reduced calorie state, getting the recommended amount of protein, incorporating whole foods, making healthy choices, staying well hydrated and practicing mindfulness when eating. and increase protein intake, fibrous foods (25 grams per day for women, 30 grams for men) and water to improve satiety and decrease hunger signals. .  Additional education and resources provided today: None  Recommended Physical Activity Goals  Megan [name] has been advised to work up to 150 minutes of moderate intensity aerobic activity a week and strengthening exercises 2-3 times per week for cardiovascular  health, weight loss maintenance and preservation of muscle mass.  She has agreed to :  Think about enjoyable ways to increase daily physical activity and overcoming barriers to exercise, Increase physical activity in their day and reduce sedentary time (increase NEAT)., Increase volume of physical activity  to a goal of 240 minutes a week, and Combine aerobic and strengthening exercises for efficiency and improved cardiometabolic health.  Medical Interventions and Pharmacotherapy  We discussed various medication options to help Megan [name] with her weight loss efforts and we both agreed to : We will decrease Mounjaro  to 5 mg once a week for better tolerability  Associated Conditions Impacted by Obesity Treatment  Assessment & Plan Type 2 diabetes mellitus in patient with obesity (HCC) HgbA1c is at goal for age and comorbid conditions. Denies symptoms of hypoglycemia or hyperglycemia. On Mounjaro  with good adherence but is experiencing constipation at 7.5 mg.   Lab Results  Component Value Date   HGBA1C 5.8 (H) 01/18/2023   HGBA1C 5.9 09/29/2022   HGBA1C 6.5 (H) 05/31/2022   Lab Results  Component Value Date   LDLCALC 82 01/18/2023   CREATININE 0.70 07/27/2023   Reduce Mounjaro  to 5 mg once a week.  We discussed increasing fiber in diet  Class 2 severe obesity with serious comorbidity and body mass index (BMI) of 39.0 to 39.9 in adult, unspecified obesity type Weight: decrease of 28 lb (12.5%) over 2 years, 7 months  Start: 08/09/2021 224 lb (101.6 kg)  End: 04/07/2024 196 lb (88.9 kg)  Experiencing nausea and constipation with Mounjaro  7.5 mg, indicating the dose may be too high. Previously tolerated 5 mg well without side effects. Weight has decreased to 190 lbs, showing progress in weight management. Emphasized the importance of maintaining weight during the holidays and incorporating high fiber foods to aid in weight management and prevent constipation. - Reduced Mounjaro  dosage to 5 mg. - Encouraged increased intake of high fiber foods such as chia seeds, lentils, beans, oats, whole wheat pasta, avocados, berries, pears, carrots, chickpeas, almonds, and leafy greens. - Encouraged adequate water intake. - Continue using Miralax  for constipation management. Constipation, unspecified  constipation type Experiencing constipation likely due to Mounjaro . Reports not having a bowel movement for a week and using magnesium citrate. Advised to increase fiber intake and maintain hydration. Black stools noted, but not concerning as they are not black like tar and are more brown. Recent colonoscopy was normal. - Continue Miralax  for constipation management. - Increase fiber intake through diet. - Ensure adequate hydration. - Monitor stool color and consistency; if black stools persist, consult PCP.          Objective   Physical Exam:  Blood pressure 132/80, pulse 64, temperature 98.6 F (37 C), height 4' 11 (1.499 m), weight 196 lb (88.9 kg), last menstrual period 09/20/2011, SpO2 96%. Body mass index is 39.59 kg/m.  General: She is overweight, cooperative, alert, well developed, and in no acute distress. PSYCH: Has normal mood, affect and thought process.   HEENT: EOMI, sclerae are anicteric. Lungs: Normal breathing effort, no conversational dyspnea. Extremities: No edema.  Neurologic: No gross sensory or motor deficits. No tremors or fasciculations noted.    Diagnostic Data Reviewed:  BMET    Component Value Date/Time   NA 139 01/18/2023 0919   K 3.8 01/18/2023 0919   CL 101 01/18/2023 0919   CO2 23 01/18/2023 0919   GLUCOSE 92 01/18/2023 0919   GLUCOSE 103 (H) 09/21/2015 1635   BUN 26 01/18/2023 0919  CREATININE 0.70 07/27/2023 0857   CALCIUM 9.9 01/18/2023 0919   GFRNONAA >60 09/21/2015 1635   GFRAA >60 09/21/2015 1635   Lab Results  Component Value Date   HGBA1C 5.8 (H) 01/18/2023   HGBA1C 6.4 (H) 08/09/2021   Lab Results  Component Value Date   INSULIN  17.2 01/18/2023   INSULIN  29.6 (H) 08/09/2021   Lab Results  Component Value Date   TSH 2.080 01/18/2023   CBC    Component Value Date/Time   WBC 7.0 01/18/2023 0919   WBC 7.6 09/21/2015 1635   RBC 5.31 (H) 01/18/2023 0919   RBC 5.11 09/29/2022 0000   HGB 14.9 01/18/2023 0919   HCT  46.0 01/18/2023 0919   PLT 240 01/18/2023 0919   MCV 87 01/18/2023 0919   MCH 28.1 01/18/2023 0919   MCH 27.2 09/21/2015 1635   MCHC 32.4 01/18/2023 0919   MCHC 32.3 09/21/2015 1635   RDW 14.6 01/18/2023 0919   Iron Studies No results found for: IRON, TIBC, FERRITIN, IRONPCTSAT Lipid Panel     Component Value Date/Time   CHOL 153 01/18/2023 0919   TRIG 130 01/18/2023 0919   HDL 48 01/18/2023 0919   CHOLHDL 4.8 (H) 05/31/2022 0822   LDLCALC 82 01/18/2023 0919   Hepatic Function Panel     Component Value Date/Time   PROT 6.9 01/18/2023 0919   ALBUMIN 4.5 01/18/2023 0919   AST 16 01/18/2023 0919   ALT 20 01/18/2023 0919   ALKPHOS 87 01/18/2023 0919   BILITOT 0.3 01/18/2023 0919      Component Value Date/Time   TSH 2.080 01/18/2023 0919   Nutritional Lab Results  Component Value Date   VD25OH 75.1 01/18/2023   VD25OH 50.9 05/31/2022   VD25OH 58.8 12/14/2021    Medications: Outpatient Encounter Medications as of 05/01/2024  Medication Sig   amLODipine  (NORVASC ) 2.5 MG tablet Take 2.5 mg by mouth daily.   blood glucose meter kit and supplies KIT Dispense based on patient and insurance preference. Use up to four times daily as directed.   Blood Glucose Monitoring Suppl DEVI 1 each by Does not apply route in the morning, at noon, and at bedtime. May substitute to any manufacturer covered by patient's insurance.   budesonide-formoterol (SYMBICORT) 160-4.5 MCG/ACT inhaler Inhale 1 puff into the lungs daily.   Cholecalciferol (VITAMIN D3) 25 MCG (1000 UT) CAPS 1,000 IU every other day   hyoscyamine (ANASPAZ) 0.125 MG TBDP tablet Place under the tongue as needed.   ketoconazole  (NIZORAL ) 2 % shampoo APPLY TO SCALP LET SIT FOR 3-5 MINUTES AND THEN RINSE.   losartan-hydrochlorothiazide (HYZAAR) 100-25 MG tablet Take 1 tablet by mouth daily.   mometasone  (ELOCON ) 0.1 % lotion Apply topically two to three times weekly.   Multiple Vitamins-Minerals (MULTIVITAMIN PO) Take  by mouth. Women over 40 vitamin   pantoprazole (PROTONIX) 40 MG tablet Take 40 mg by mouth daily as needed.   rosuvastatin (CRESTOR) 5 MG tablet Take 5 mg by mouth 2 (two) times a week.   tirzepatide  (MOUNJARO ) 5 MG/0.5ML Pen Inject 5 mg into the skin once a week.   [DISCONTINUED] tirzepatide  (MOUNJARO ) 7.5 MG/0.5ML Pen Inject 7.5 mg into the skin once a week.   No facility-administered encounter medications on file as of 05/01/2024.     Follow-Up   Return in about 4 weeks (around 05/29/2024) for For Weight Mangement with Dr. Francyne.SABRA She was informed of the importance of frequent follow up visits to maximize her success with intensive lifestyle modifications  for her multiple health conditions.  Attestation Statement   Reviewed by clinician on day of visit: allergies, medications, problem list, medical history, surgical history, family history, social history, and previous encounter notes.     Lucas Parker, MD

## 2024-05-28 ENCOUNTER — Ambulatory Visit (INDEPENDENT_AMBULATORY_CARE_PROVIDER_SITE_OTHER): Admitting: Internal Medicine

## 2024-05-28 ENCOUNTER — Encounter (INDEPENDENT_AMBULATORY_CARE_PROVIDER_SITE_OTHER): Payer: Self-pay | Admitting: Internal Medicine

## 2024-05-28 VITALS — BP 128/72 | HR 60 | Temp 97.7°F | Ht 59.0 in | Wt 198.0 lb

## 2024-05-28 DIAGNOSIS — Z7985 Long-term (current) use of injectable non-insulin antidiabetic drugs: Secondary | ICD-10-CM | POA: Diagnosis not present

## 2024-05-28 DIAGNOSIS — E66812 Obesity, class 2: Secondary | ICD-10-CM

## 2024-05-28 DIAGNOSIS — E669 Obesity, unspecified: Secondary | ICD-10-CM

## 2024-05-28 DIAGNOSIS — E1169 Type 2 diabetes mellitus with other specified complication: Secondary | ICD-10-CM | POA: Diagnosis not present

## 2024-05-28 DIAGNOSIS — Z6839 Body mass index (BMI) 39.0-39.9, adult: Secondary | ICD-10-CM

## 2024-05-28 DIAGNOSIS — E785 Hyperlipidemia, unspecified: Secondary | ICD-10-CM | POA: Diagnosis not present

## 2024-05-28 MED ORDER — TIRZEPATIDE 5 MG/0.5ML ~~LOC~~ SOAJ: 5.0000 mg | SUBCUTANEOUS | 0 refills | Status: DC

## 2024-05-28 NOTE — Progress Notes (Signed)
 "  Office: [phone]  /  Fax: [phone]  Weight Summary and Body Composition Analysis (BIA)  Vitals Temp: 97.7 F (36.5 C) BP: 128/72 Pulse Rate: 60 SpO2: 96 %   Anthropometric Measurements Height: 4' 11 (1.499 m) Weight: 198 lb (89.8 kg) BMI (Calculated): 39.97 Weight at Last Visit: 196 lb Weight Lost Since Last Visit: 0 lb Weight Gained Since Last Visit: 2 lb Starting Weight: 224 lb Total Weight Loss (lbs): 26 lb (11.8 kg) Peak Weight: 234 lb   Body Composition  Body Fat %: 49 % Fat Mass (lbs): 97.2 lbs Muscle Mass (lbs): 95.8 lbs Total Body Water (lbs): 78.4 lbs Visceral Fat Rating : 17    RMR: 1843  Today's Visit #: 43  Starting Date: 08/09/21   Subjective   Chief Complaint: Obesity  Interval History Discussed the use of AI scribe software for clinical note transcription with the patient, who gave verbal consent to proceed.  History of Present Illness    Megan [name] is a 74 year old female with type 2 diabetes, hypertension, and hyperlipidemia who presents for medical weight management.  She has gained two pounds since her last visit and adheres to a 1200 calorie low-carb meal plan about 25% of the time. She has not been exercising regularly, and during the holidays, some unhealthy habits returned. However, she is motivated to recalibrate and continue her weight loss journey.  Her weight history includes a peak of 234 pounds in 2023, with a subsequent weight loss of 28 pounds by the end of last year, bringing her to 196 pounds. Despite the recent weight gain, she remains optimistic about getting back on track and maintaining her progress.  She has type 2 diabetes, hypertension, and hyperlipidemia with a positive lipoprotein A. She has a history of plaque buildup in her heart. No history of stroke or heart attack. She previously took rosuvastatin but discontinued it due to severe leg cramps that disrupted her sleep. She is not currently  on any statin medication.  She uses an inhaler for asthma once or twice a year and reports no significant issues with asthma currently. She is on Mounjaro  for weight management, which has helped with appetite control, although she experiences occasional nausea, particularly after consuming foods with higher sugar content.  She is motivated to improve her health for her grandchildren, aged 77 and 39, and to reduce her risk of complications from her medical conditions. She has increased her physical activity, averaging over 8500 steps per day, and aims to continue improving her lifestyle habits.     Challenges affecting patient progress: The motivation and a lapse during the holidays   Pharmacotherapy for weight management: She is currently taking Monjauro with diabetes as the primary indication and obesity secondary with adequate clinical response  and experiencing the following side effects: constipation.   Assessment and Plan   Treatment Plan For Obesity:  Recommended Dietary Goals  Megan [name] is currently in the action stage of change. As such, her goal is to continue weight management plan. She has agreed to: continue current plan  Behavioral Health and Counseling  We discussed the following behavioral modification strategies today: continue to work on maintaining a reduced calorie state, getting the recommended amount of protein, incorporating whole foods, making healthy choices, staying well hydrated and practicing mindfulness when eating. and increase protein intake, fibrous foods (25 grams per day for women, 30 grams for men) and water to improve satiety and decrease hunger signals. .  Additional education  and resources provided today: None  Recommended Physical Activity Goals  Megan [name] has been advised to work up to 150 minutes of moderate intensity aerobic activity a week and strengthening exercises 2-3 times per week for cardiovascular health, weight loss maintenance and  preservation of muscle mass.  She has agreed to :  Think about enjoyable ways to increase daily physical activity and overcoming barriers to exercise, Increase physical activity in their day and reduce sedentary time (increase NEAT)., Increase volume of physical activity to a goal of 240 minutes a week, and Combine aerobic and strengthening exercises for efficiency and improved cardiometabolic health.  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 Emphasized the importance of dietary changes to maintain blood sugar levels. Discussed the role of protein and vegetables in appetite suppression and blood sugar control. Highlighted the need for consistent lifestyle changes to prevent complications associated with diabetes. - Continue current diabetes management plan with Mounjaro . - Encouraged dietary modifications focusing on protein and vegetables. - Encouraged monitoring of blood sugar levels. Class 2 severe obesity with serious comorbidity and body mass index (BMI) of 39.0 to 39.9 in adult, unspecified obesity type Recent weight gain of 2 pounds since last visit. Previously lost 28 pounds, approximately 13% of total body weight. Currently on a 1200 calorie low carb meal plan, but adherence is inconsistent. Mounjaro  is aiding in appetite control, but lifestyle changes are crucial for sustained weight loss. Discussed the importance of maintaining a healthy lifestyle to prevent complications associated with obesity, including cardiovascular risks. Emphasized the role of Mounjaro  in lowering cardiovascular risk, though it does not replace the need for  cholesterol-lowering medications. - Continue Mounjaro  at current dose. - Encouraged adherence to 1200 calorie low carb meal plan. - Encouraged gradual lifestyle changes, focusing on one meal at a time and increasing physical activity. - Encouraged increased intake of fruits, vegetables, and protein. - Encouraged monitoring of fiber and water intake to prevent constipation. - Scheduled follow-up in one month to review progress and labs. Hyperlipidemia associated with type 2 diabetes mellitus (HCC) According to records she has a history of positivity for LPA she has coronary artery calcium buildup and has multiple risk factors for cardiovascular disease including history of hypertension and type 2 diabetes.  We discussed that in the presence of high cardiovascular risk that she does benefit from lipid-lowering therapy unfortunately he is not tolerating rosuvastatin and I think is worthwhile challenging with other statins or considering Repatha for cardiovascular risk reduction.  She will be working with her primary care provider on this            Objective   Physical Exam:  Blood pressure 128/72, pulse 60, temperature 97.7 F (36.5 C), height 4' 11 (1.499 m), weight 198 lb (89.8 kg), last menstrual period 09/20/2011, SpO2 96%. Body mass index is 39.99 kg/m.  General: She is overweight, cooperative, alert, well developed, and in no acute distress. PSYCH: Has normal mood, affect and thought process.   HEENT: EOMI, sclerae are anicteric. Lungs: Normal breathing effort, no conversational dyspnea. Extremities: No edema.  Neurologic: No gross sensory or motor deficits. No tremors or fasciculations noted.    Diagnostic Data Reviewed:  BMET    Component Value Date/Time   NA 139 01/18/2023 0919   K 3.8 01/18/2023 0919   CL 101 01/18/2023 0919   CO2 23 01/18/2023 0919   GLUCOSE 92 01/18/2023 0919   GLUCOSE 103 (H) 09/21/2015 1635   BUN 26 01/18/2023 0919   CREATININE 0.70  07/27/2023 0857   CALCIUM 9.9 01/18/2023 0919   GFRNONAA >60 09/21/2015 1635   GFRAA >60 09/21/2015 1635   Lab Results  Component Value Date   HGBA1C 5.8 (H) 01/18/2023   HGBA1C 6.4 (H) 08/09/2021   Lab Results  Component Value Date   INSULIN  17.2 01/18/2023   INSULIN  29.6 (H) 08/09/2021   Lab Results  Component Value Date   TSH 2.080 01/18/2023   CBC    Component Value Date/Time   WBC 7.0 01/18/2023 0919   WBC 7.6 09/21/2015 1635   RBC 5.31 (H) 01/18/2023 0919   RBC 5.11 09/29/2022 0000   HGB 14.9 01/18/2023 0919   HCT 46.0 01/18/2023 0919   PLT 240 01/18/2023 0919   MCV 87 01/18/2023 0919   MCH 28.1 01/18/2023 0919   MCH 27.2 09/21/2015 1635   MCHC 32.4 01/18/2023 0919   MCHC 32.3 09/21/2015 1635   RDW 14.6 01/18/2023 0919   Iron Studies No results found for: IRON, TIBC, FERRITIN, IRONPCTSAT Lipid Panel     Component Value Date/Time   CHOL 153 01/18/2023 0919   TRIG 130 01/18/2023 0919   HDL 48 01/18/2023 0919   CHOLHDL 4.8 (H) 05/31/2022 0822   LDLCALC 82 01/18/2023 0919   Hepatic Function Panel     Component Value Date/Time   PROT 6.9 01/18/2023 0919   ALBUMIN 4.5 01/18/2023 0919   AST 16 01/18/2023 0919   ALT 20 01/18/2023 0919   ALKPHOS 87 01/18/2023 0919   BILITOT 0.3 01/18/2023 0919      Component Value Date/Time   TSH 2.080 01/18/2023 0919   Nutritional Lab Results  Component Value Date   VD25OH 75.1 01/18/2023   VD25OH 50.9 05/31/2022   VD25OH 58.8 12/14/2021    Medications: Outpatient Encounter Medications as of 05/28/2024  Medication Sig   amLODipine  (NORVASC ) 2.5 MG tablet Take 2.5 mg by mouth daily.   blood glucose meter kit and supplies KIT Dispense based on patient and insurance preference. Use up to four times daily as directed.   Blood Glucose Monitoring Suppl DEVI 1 each by Does not apply route in the morning, at noon, and at bedtime. May substitute to any manufacturer covered by patient's insurance.    budesonide-formoterol (SYMBICORT) 160-4.5 MCG/ACT inhaler Inhale 1 puff into the lungs daily.   Cholecalciferol (VITAMIN D3) 25 MCG (1000 UT) CAPS 1,000 IU every other day   hyoscyamine (ANASPAZ) 0.125 MG TBDP tablet Place under the tongue as needed.   ketoconazole  (NIZORAL ) 2 % shampoo APPLY TO SCALP LET SIT FOR 3-5 MINUTES AND THEN RINSE.   losartan-hydrochlorothiazide (HYZAAR) 100-25 MG tablet Take 1 tablet by mouth daily.   mometasone  (ELOCON ) 0.1 % lotion Apply topically two to three times weekly.   Multiple Vitamins-Minerals (MULTIVITAMIN PO) Take by mouth. Women over 40 vitamin   pantoprazole (PROTONIX) 40 MG tablet Take 40 mg by mouth daily as needed.   rosuvastatin (CRESTOR) 5 MG tablet Take 5 mg by mouth 2 (two) times a week.   tirzepatide  (MOUNJARO ) 5 MG/0.5ML Pen Inject 5 mg into the skin once a week.   No facility-administered encounter medications on file as of 05/28/2024.     Follow-Up  No follow-ups on file.SABRA She was informed of the importance of frequent follow up visits to maximize her success with intensive lifestyle modifications for her multiple health conditions.  Attestation Statement   Reviewed by clinician on day of visit: allergies, medications, problem list, medical history, surgical history, family history, social history, and previous encounter notes.     Lucas Parker, MD patient presents today for medical weight management associated conditions include 2 diabetes "

## 2024-05-28 NOTE — Assessment & Plan Note (Signed)
 According to records she has a history of positivity for LPA she has coronary artery calcium buildup and has multiple risk factors for cardiovascular disease including history of hypertension and type 2 diabetes.  We discussed that in the presence of high cardiovascular risk that she does benefit from lipid-lowering therapy unfortunately he is not tolerating rosuvastatin and I think is worthwhile challenging with other statins or considering Repatha for cardiovascular risk reduction.  She will be working with her primary care provider on this

## 2024-05-28 NOTE — Assessment & Plan Note (Signed)
 Managed with lifestyle modifications and Mounjaro . Emphasized the importance of dietary changes to maintain blood sugar levels. Discussed the role of protein and vegetables in appetite suppression and blood sugar control. Highlighted the need for consistent lifestyle changes to prevent complications associated with diabetes. - Continue current diabetes management plan with Mounjaro . - Encouraged dietary modifications focusing on protein and vegetables. - Encouraged monitoring of blood sugar levels.

## 2024-05-28 NOTE — Assessment & Plan Note (Signed)
 Recent weight gain of 2 pounds since last visit. Previously lost 28 pounds, approximately 13% of total body weight. Currently on a 1200 calorie low carb meal plan, but adherence is inconsistent. Mounjaro  is aiding in appetite control, but lifestyle changes are crucial for sustained weight loss. Discussed the importance of maintaining a healthy lifestyle to prevent complications associated with obesity, including cardiovascular risks. Emphasized the role of Mounjaro  in lowering cardiovascular risk, though it does not replace the need for cholesterol-lowering medications. - Continue Mounjaro  at current dose. - Encouraged adherence to 1200 calorie low carb meal plan. - Encouraged gradual lifestyle changes, focusing on one meal at a time and increasing physical activity. - Encouraged increased intake of fruits, vegetables, and protein. - Encouraged monitoring of fiber and water intake to prevent constipation. - Scheduled follow-up in one month to review progress and labs.

## 2024-06-02 ENCOUNTER — Ambulatory Visit (HOSPITAL_BASED_OUTPATIENT_CLINIC_OR_DEPARTMENT_OTHER): Admitting: Obstetrics & Gynecology

## 2024-06-02 ENCOUNTER — Encounter (HOSPITAL_BASED_OUTPATIENT_CLINIC_OR_DEPARTMENT_OTHER): Payer: Self-pay

## 2024-06-03 NOTE — Progress Notes (Unsigned)
 "  Breast and pelvic exam Patient name: Megan [name] MRN [id number]  Date of birth: March 18, 1951 Chief Complaint:   Gynecologic Exam  History of Present Illness:   Megan [name] is a 74 y.o. G83P2002 Caucasian female being seen today for breast and pelvic exam.  She has been having breast pain for several months.  Reported this when she went for her mammogram.  The pain is where her seatbelt is with driving.  MMG 10/2023 was normal.  No recent trauma.    She is having vaginal dryness.  No vaginal bleeding.   Would like to consider other options.  Patient's last menstrual period was 09/20/2011.  Last pap 09/12/2022. Results were: NILM w/ HRHPV not done. H/O abnormal pap: yes Last mammogram: 11/07/2023. Results were: normal. Family h/o breast cancer: no Last colonoscopy: 08/19/2020. Results were: abnormal, two polyps. Family h/o colorectal cancer: no.  Follow up 7 years.   DEXA:  11/2021.  T score -1.4.     06/04/2024   10:09 AM 01/30/2023    8:36 AM 09/12/2022    9:20 AM 08/09/2021    8:58 AM  Depression screen PHQ 2/9  Decreased Interest 0 0 0 1  Down, Depressed, Hopeless 0 0 0 0  PHQ - 2 Score 0 0 0 1  Altered sleeping    1  Tired, decreased energy    1  Change in appetite    1  Feeling bad or failure about yourself     1  Trouble concentrating    0  Moving slowly or fidgety/restless    0  Suicidal thoughts    0  PHQ-9 Score    5   Difficult doing work/chores    Not difficult at all     Data saved with a previous flowsheet row definition    Review of Systems:   Pertinent items are noted in HPI Denies any urinary or bowel changes.  Denies pelvic pain.   Pertinent History Reviewed:  Reviewed past medical,surgical, social and family history.  Reviewed problem list, medications and allergies. Physical Assessment:   Vitals:   06/04/24 0956  BP: 132/80  Weight: 198 lb (89.8 kg)  Height: 4' 11.5 (1.511 m)  Body mass index is 39.32 kg/m.        Physical Examination:    General appearance - well appearing, and in no distress  Mental status - alert, oriented to person, place, and time  Psych:  She has a normal mood and affect  Skin - warm and dry, normal color, no suspicious lesions noted  Chest - effort normal, all lung fields clear to auscultation bilaterally  Heart - normal rate and regular rhythm  Neck:  midline trachea, no thyromegaly or nodules  Breasts - breasts appear normal, no suspicious masses, no skin or nipple changes or  axillary nodes  Abdomen - soft, nontender, nondistended, no masses or organomegaly  Pelvic - VULVA: normal appearing vulva with no masses, tenderness or lesions   VAGINA: normal appearing vagina with normal color and discharge, no lesions   CERVIX: normal appearing cervix without discharge or lesions, no CMT  Thin prep pap is not indicated  UTERUS: uterus is felt to be normal size, shape, consistency and nontender   ADNEXA: No adnexal masses or tenderness noted.  Rectal - normal rectal, good sphincter tone, no masses felt.   Extremities:  No swelling or varicosities noted  Chaperone present for exam  No results found for this or any previous visit (  from the past 24 hours).  Assessment & Plan:  1. Encntr for gyn exam (general) (routine) w/o abn findings (Primary) - Pap smear 2024 - Mammogram 10/2023 - Colonoscopy 2022.  Follow up 2029. - Bone mineral density 2023.  Reviewed with pt.  Will repeat 1-2 years. - lab work done with PCP, Izetta Cork - vaccines reviewed/updated  2. Vaginal atrophy - using replens vaginal moisturizer - decided she would like rx for vaginal estrogen cream  3. Osteopenia of necks of both femurs - taking OTC Vit D  4. Postmenopausal   No orders of the defined types were placed in this encounter.   Meds: No orders of the defined types were placed in this encounter.   Follow-up: f/u 2 years  Ronal GORMAN [place], MD 06/04/2024 11:05 AM "

## 2024-06-04 ENCOUNTER — Encounter (HOSPITAL_BASED_OUTPATIENT_CLINIC_OR_DEPARTMENT_OTHER): Payer: Self-pay | Admitting: Obstetrics & Gynecology

## 2024-06-04 ENCOUNTER — Ambulatory Visit (INDEPENDENT_AMBULATORY_CARE_PROVIDER_SITE_OTHER): Payer: Self-pay | Admitting: Obstetrics & Gynecology

## 2024-06-04 ENCOUNTER — Ambulatory Visit (HOSPITAL_BASED_OUTPATIENT_CLINIC_OR_DEPARTMENT_OTHER): Payer: Self-pay | Admitting: Obstetrics & Gynecology

## 2024-06-04 VITALS — BP 132/80 | Ht 59.5 in | Wt 198.0 lb

## 2024-06-04 DIAGNOSIS — Z1331 Encounter for screening for depression: Secondary | ICD-10-CM | POA: Diagnosis not present

## 2024-06-04 DIAGNOSIS — M85851 Other specified disorders of bone density and structure, right thigh: Secondary | ICD-10-CM | POA: Diagnosis not present

## 2024-06-04 DIAGNOSIS — Z01419 Encounter for gynecological examination (general) (routine) without abnormal findings: Secondary | ICD-10-CM

## 2024-06-04 DIAGNOSIS — M85852 Other specified disorders of bone density and structure, left thigh: Secondary | ICD-10-CM

## 2024-06-04 DIAGNOSIS — N952 Postmenopausal atrophic vaginitis: Secondary | ICD-10-CM | POA: Diagnosis not present

## 2024-06-04 DIAGNOSIS — Z780 Asymptomatic menopausal state: Secondary | ICD-10-CM

## 2024-06-04 LAB — COMPREHENSIVE METABOLIC PANEL WITH GFR
Albumin: 4.2 (ref 3.5–5.0)
Calcium: 9.4 (ref 8.7–10.7)
Globulin: 2.5
eGFR: 97

## 2024-06-04 LAB — LIPID PANEL
Cholesterol: 199 (ref 0–200)
HDL: 54 (ref 35–70)
LDL Cholesterol: 127

## 2024-06-04 LAB — HEPATIC FUNCTION PANEL
ALT: 23 U/L (ref 7–35)
AST: 15 (ref 13–35)
Alkaline Phosphatase: 75 (ref 25–125)
Bilirubin, Total: 0.3

## 2024-06-04 LAB — HEMOGLOBIN A1C: Hemoglobin A1C: 5.8

## 2024-06-04 LAB — CBC AND DIFFERENTIAL
HCT: 48 — AB (ref 36–46)
Hemoglobin: 14.8 (ref 12.0–16.0)
Platelets: 226 10*3/uL (ref 150–400)
WBC: 5.8

## 2024-06-04 LAB — BASIC METABOLIC PANEL WITH GFR
CO2: 23 — AB (ref 13–22)
Chloride: 103 (ref 99–108)
Creatinine: 0.6 (ref 0.5–1.1)
Glucose: 101
Potassium: 4.5 meq/L (ref 3.5–5.1)
Sodium: 141 (ref 137–147)

## 2024-06-04 MED ORDER — ESTRADIOL 0.01 % VA CREA: TOPICAL_CREAM | VAGINAL | 3 refills | Status: AC

## 2024-06-11 ENCOUNTER — Ambulatory Visit (INDEPENDENT_AMBULATORY_CARE_PROVIDER_SITE_OTHER): Admitting: Internal Medicine

## 2024-06-19 ENCOUNTER — Encounter (INDEPENDENT_AMBULATORY_CARE_PROVIDER_SITE_OTHER): Payer: Self-pay | Admitting: Internal Medicine

## 2024-06-22 ENCOUNTER — Emergency Department (HOSPITAL_BASED_OUTPATIENT_CLINIC_OR_DEPARTMENT_OTHER)
Admission: EM | Admit: 2024-06-22 | Discharge: 2024-06-22 | Disposition: A | Discharge status: Home / Self Care | Attending: Emergency Medicine | Admitting: Emergency Medicine

## 2024-06-22 ENCOUNTER — Other Ambulatory Visit: Payer: Self-pay

## 2024-06-22 DIAGNOSIS — S61211A Laceration without foreign body of left index finger without damage to nail, initial encounter: Secondary | ICD-10-CM

## 2024-06-22 NOTE — ED Triage Notes (Signed)
 Pt reports laceration from kitchen knife to  left 2nd finger.  Denies blood thinners - reports unable to control bleeding at home. Pressure dressing applied in triage.

## 2024-06-22 NOTE — Discharge Instructions (Signed)
 It was a pleasure meeting with you today.  Your laceration was repaired using skin glue.  See attached care instructions.  We also placed a finger splint to avoid excess bending of your index finger and potential reopening of the wound; you can discontinue use of this in 2 days.  Recommend follow-up with hand surgeon, Dr. Celena, in 7 to 10 days for wound recheck.  I have included their office information and you will need to schedule an appointment as soon as you are able.  Return to ED if you develop signs of infection including worsening pain or you develop swelling, redness, discharge, fevers or any other new concerning symptoms.

## 2024-06-22 NOTE — ED Provider Notes (Cosign Needed)
 " Megan [name] Provider Note   CSN: [id number] Arrival date & time: 06/22/24  1430     Patient presents with: Extremity Laceration   Megan [name] is a 74 y.o. female.  With pertinent medical history of basal cell carcinoma, asthma, diverticulitis, IBS, GERD, osteoarthritis, hypertension, type 2 diabetes.   Patient is here for evaluation of laceration to left index finger.  Earlier today patient was cutting a turkey breast when the knife caught a tendon in the turkey then cut through it quickly and she accidentally cut her finger when the knife moved quickly.  She applied pressure with bandaging and held the finger above her heart but was unable to control the bleeding so she came in to our facility.  She is not taking a blood thinner.  The history is provided by the patient.      Prior to Admission medications  Medication Sig Start Date End Date Taking? Authorizing Provider  blood glucose meter kit and supplies KIT Dispense based on patient and insurance preference. Use up to four times daily as directed. 12/10/22   Midge Sober, DO  Blood Glucose Monitoring Suppl DEVI 1 each by Does not apply route in the morning, at noon, and at bedtime. May substitute to any manufacturer covered by patient's insurance. 12/28/22   Opalski, Sober, DO  budesonide-formoterol (SYMBICORT) 160-4.5 MCG/ACT inhaler Inhale 1 puff into the lungs daily. 05/30/17   [provider]  Cholecalciferol (VITAMIN D3) 25 MCG (1000 UT) CAPS 1,000 IU every other day 02/12/23   Midge Sober, DO  estradiol  (ESTRACE ) 0.01 % CREA vaginal cream 1 gram vaginally twice weekly 06/04/24   Cleotilde Ronal GORMAN, MD  hyoscyamine (ANASPAZ) 0.125 MG TBDP tablet Place under the tongue as needed.    [provider]  ketoconazole  (NIZORAL ) 2 % shampoo APPLY TO SCALP LET SIT FOR 3-5 MINUTES AND THEN RINSE. 01/02/22   Livingston Rigg, MD  losartan-hydrochlorothiazide (HYZAAR) 100-25 MG  tablet Take 1 tablet by mouth daily.    [provider]  mometasone  (ELOCON ) 0.1 % lotion Apply topically two to three times weekly. 09/07/20   Cleotilde Ronal GORMAN, MD  Multiple Vitamins-Minerals (MULTIVITAMIN PO) Take by mouth. Women over 40 vitamin    [provider]  pantoprazole (PROTONIX) 40 MG tablet Take 40 mg by mouth daily as needed.    [provider]  rosuvastatin (CRESTOR) 5 MG tablet Take 5 mg by mouth 2 (two) times a week.    [provider]  tirzepatide  (MOUNJARO ) 5 MG/0.5ML Pen Inject 5 mg into the skin once a week. 05/28/24   Francyne Romano, MD    Allergies: Metformin  and related, Augmentin [amoxicillin-pot clavulanate], Erythromycin base, Guaifenesin & derivatives, Hydrocodeine [dihydrocodeine], Metronidazole , and Nexium [esomeprazole magnesium]    Review of Systems  Skin:  Positive for wound.    Updated Vital Signs BP (!) 187/103 (BP Location: Right Arm)   Pulse 67   Temp 98.2 F (36.8 C) (Oral)   Resp 17   LMP 09/20/2011   SpO2 99%   Physical Exam Vitals and nursing note reviewed.  Constitutional:      General: She is not in acute distress.    Appearance: Normal appearance. She is not ill-appearing, toxic-appearing or diaphoretic.  HENT:     Head: Normocephalic and atraumatic.  Eyes:     General: No scleral icterus.    Extraocular Movements: Extraocular movements intact.     Conjunctiva/sclera: Conjunctivae normal.  Cardiovascular:  Rate and Rhythm: Normal rate and regular rhythm.  Pulmonary:     Effort: Pulmonary effort is normal. No respiratory distress.  Musculoskeletal:        General: Normal range of motion.     Cervical back: Normal range of motion.  Skin:    General: Skin is warm and dry.     Coloration: Skin is not jaundiced or pale.     Comments: Laceration to just below the distal interphalangeal joint of left index finger, dorsal side. Bleeding controlled with pressure bandaging applied in triage.   Neurological:     Mental Status: She is alert and oriented to person, place, and time.     Media Information  Document Information  Photographic Image: Photos  L  06/22/2024 15:29  Attached To:  Hospital Encounter on 06/22/24  Source Information  Rosina Almarie LABOR, PA-C  [facility]    (all labs ordered are listed, but only abnormal results are displayed) Labs Reviewed - No data to display  EKG: None  Radiology: No results found.   .Laceration Repair  Date/Time: 06/22/2024 4:06 PM  Performed by: Rosina Almarie LABOR, PA-C Authorized by: Rosina Almarie LABOR, PA-C   Consent:    Consent obtained:  Verbal   Consent given by:  Patient   Risks, benefits, and alternatives were discussed: yes     Risks discussed:  Need for additional repair, infection, nerve damage, poor wound healing, poor cosmetic result, pain, retained foreign body, tendon damage and vascular damage   Alternatives discussed:  No treatment, delayed treatment, observation and referral Universal protocol:    Procedure explained and questions answered to patient or proxy's satisfaction: yes     Relevant documents present and verified: yes     Test results available: yes     Imaging studies available: yes     Site/side marked: yes     Immediately prior to procedure, a time out was called: yes     Patient identity confirmed:  Verbally with patient, hospital-assigned identification number and provided demographic data Anesthesia:    Anesthesia method:  None Laceration details:    Location:  Finger   Finger location:  L index finger   Length (cm):  1.5 Pre-procedure details:    Preparation:  Patient was prepped and draped in usual sterile fashion Exploration:    Hemostasis achieved with:  Direct pressure Treatment:    Area cleansed with:  Soap and water   Amount of cleaning:  Standard   Irrigation solution:  Sterile water   Irrigation method:  Tap   Visualized foreign bodies/material removed:  no   Skin repair:    Repair method:  Tissue adhesive Approximation:    Approximation:  Close Repair type:    Repair type:  Simple Post-procedure details:    Dressing:  Open (no dressing)   Procedure completion:  Tolerated    Medications Ordered in the ED - No data to display   Patient presents to the ED for concern of laceration to finger, this involves an extensive number of treatment options, and is a complaint that carries with it a high risk of complications and morbidity.  The differential diagnosis includes fracture, foreign body, secondary infection.   Co morbidities that complicate the patient evaluation  Hypertension   Medicines ordered and prescription drug management:  I ordered medication including dermabond  for laceration repair.  Reevaluation of the patient after these medicines showed that the patient improved I have reviewed the patients home medicines and have made  adjustments as needed   Problem List / ED Course:     Laceration to left index finger, dorsal side. Bleeding controlled with compression bandaging placed in triage. No nail involvement or foreign body noted. Repaired using dermabond. Due to the location near the DIP and high likelihood of reopening with bending of the finger, finger splint was placed. Return precautions discussed with patient and her husband and both verbalized understanding. Stable for discharge.   Reevaluation:  After the interventions noted above, I reevaluated the patient and found that they have :improved   Dispostion:  After consideration of the diagnostic results and the patients response to treatment, I feel that the patent would benefit from supportive care in the home setting with continued wound care and follow-up with hand surgery in 7-10 days for revaluation of wound. Return precautions given.                                   Medical Decision Making  This note was produced using [employer] voice  recognition. While the provider has reviewed and verified all clinical information, transcription errors may remain.     Final diagnoses:  Laceration of left index finger without foreign body without damage to nail, initial encounter    ED Discharge Orders     None          Rosina Almarie LABOR, PA-C 06/22/24 1609  "

## 2024-06-25 ENCOUNTER — Encounter (INDEPENDENT_AMBULATORY_CARE_PROVIDER_SITE_OTHER): Payer: Self-pay | Admitting: Internal Medicine

## 2024-06-25 ENCOUNTER — Ambulatory Visit (INDEPENDENT_AMBULATORY_CARE_PROVIDER_SITE_OTHER): Admitting: Internal Medicine

## 2024-06-25 VITALS — BP 138/84 | HR 65 | Temp 98.4°F | Ht 59.0 in | Wt 197.0 lb

## 2024-06-25 DIAGNOSIS — Z7985 Long-term (current) use of injectable non-insulin antidiabetic drugs: Secondary | ICD-10-CM | POA: Diagnosis not present

## 2024-06-25 DIAGNOSIS — E1159 Type 2 diabetes mellitus with other circulatory complications: Secondary | ICD-10-CM | POA: Diagnosis not present

## 2024-06-25 DIAGNOSIS — E7841 Elevated Lipoprotein(a): Secondary | ICD-10-CM

## 2024-06-25 DIAGNOSIS — I10 Essential (primary) hypertension: Secondary | ICD-10-CM | POA: Diagnosis not present

## 2024-06-25 DIAGNOSIS — E66812 Obesity, class 2: Secondary | ICD-10-CM | POA: Diagnosis not present

## 2024-06-25 DIAGNOSIS — E669 Obesity, unspecified: Secondary | ICD-10-CM

## 2024-06-25 DIAGNOSIS — Z6839 Body mass index (BMI) 39.0-39.9, adult: Secondary | ICD-10-CM | POA: Diagnosis not present

## 2024-06-25 MED ORDER — TIRZEPATIDE 5 MG/0.5ML ~~LOC~~ SOAJ: 5.0000 mg | SUBCUTANEOUS | 1 refills | Status: AC

## 2024-06-25 NOTE — Progress Notes (Signed)
 "  Office: [phone]  /  Fax: [phone]  Weight Summary and Body Composition Analysis (BIA)  Vitals Temp: 98.4 F (36.9 C) BP: 138/84 (manual) Pulse Rate: 65 SpO2: 96 %   Anthropometric Measurements Height: 4' 11 (1.499 m) Weight: 197 lb (89.4 kg) BMI (Calculated): 39.77 Weight at Last Visit: 198 lb Weight Lost Since Last Visit: 1 lb Weight Gained Since Last Visit: 0 lb Starting Weight: 224 lb Total Weight Loss (lbs): 27 lb (12.2 kg) Peak Weight: 234 lb   Body Composition  Body Fat %: 47.5 % Fat Mass (lbs): 93.8 lbs Muscle Mass (lbs): 98.6 lbs Total Body Water (lbs): 75.2 lbs Visceral Fat Rating : 16    RMR: 1843  Today's Visit #: 44  Starting Date: 08/09/21   Subjective   Chief Complaint: Obesity  Interval History  Discussed the use of AI scribe software for clinical note transcription with the patient, who gave verbal consent to proceed.  History of Present Illness Megan [name] is a 74 year old female who presents for medical weight management.  She has been on Mounjaro  since April 2024, initiated after her A1c reached 6.5. Initially, she lost 25 pounds with lifestyle changes alone, and after starting Mounjaro , she lost an additional 14 pounds over four months. However, she experienced some weight regain after the holidays and a hip replacement surgery. She has since been working on understanding her weight management program better and has been able to lose weight again, getting close to her pre-holiday weight.  Her A1c has dropped to 5.8 from a previous high of 6.5. She is currently on pravastatin 20 mg for her cholesterol, which she tolerates well without experiencing leg cramps, a side effect she had with a previous medication. Her LDL cholesterol was noted to be 127, and she is addressing this with her current statin therapy.  She manages her diet by maintaining a calorie intake of around 1200 calories per day and uses a [employer] app to  ensure she eats clean and healthy foods. She is mindful of her carbohydrate intake to manage her diabetes. She acknowledges the need to increase her physical activity, which has been inconsistent, especially during the snowy weather.  She has a history of hip replacement surgery, which contributed to her weight regain. She is also post-menopausal, which affects her metabolism and muscle mass. She is considering incorporating more structured physical activities like yoga and Tai Chi to improve her core strength and balance.     Challenges affecting patient progress: multiple competing priorities, low volume of physical activity at present , and medical comorbidities.    Pharmacotherapy for weight management: She is currently taking Monjauro with diabetes as the primary indication and obesity secondary with adequate clinical response  and without side effects..   Assessment and Plan   Treatment Plan For Obesity:  Recommended Dietary Goals  Verla is currently in the action stage of change. As such, her goal is to continue weight management plan. She has agreed to: incorporate prepackaged healthy meals for convenience, incorporate 1-2 meal replacements a day for convenience , and continue current reduced-calorie meal plan  Behavioral Health and Counseling  We discussed the following behavioral modification strategies today: continue to work on maintaining a reduced calorie state, getting the recommended amount of protein, incorporating whole foods, making healthy choices, staying well hydrated and practicing mindfulness when eating. and increase protein intake, fibrous foods (25 grams per day for women, 30 grams for men) and water to improve satiety  and decrease hunger signals. .  Additional education and resources provided today: None  Recommended Physical Activity Goals  Takaya has been advised to work up to 150 minutes of moderate intensity aerobic activity a week and strengthening  exercises 2-3 times per week for cardiovascular health, weight loss maintenance and preservation of muscle mass.  She has agreed to :  Think about enjoyable ways to increase daily physical activity and overcoming barriers to exercise, Increase physical activity in their day and reduce sedentary time (increase NEAT)., and Increase and monitor steps for a goal of 10,000 per day  Medical Interventions and Pharmacotherapy  We discussed various medication options to help Dimple with her weight loss efforts and we both agreed to : Adequate clinical response to anti-obesity medication, continue current anti-obesity regimen  Associated Conditions Impacted by Obesity Treatment  Assessment & Plan Type 2 diabetes mellitus in patient with obesity (HCC)  Class 2 severe obesity with serious comorbidity and body mass index (BMI) of 39.0 to 39.9 in adult, unspecified obesity type  Hypertension associated with diabetes (HCC) Vitals:   06/25/24 1000  BP: 138/84    Blood pressure not at goal for age and risk category.  On Hyzaar without adverse effects.  Most recent renal parameters reviewed in Care Everywhere which showed stable electrolytes and kidney function.  Continue with weight loss therapy. Losing 10% may improve blood pressure control. Monitor for symptoms of orthostasis while losing weight. Continue current regimen and home monitoring for a goal blood pressure of < 120/80.   Elevated lipoprotein(a) Patient is now on pravastatin had side effects to rosuvastatin she is tolerating medication well without any adverse effects.  In addition to maintaining a diet low in saturated fats she will continue statin therapy    Assessment and Plan Assessment & Plan Class 2 obesity She has been on Mounjaro  since April 2023, initially losing 25 pounds with lifestyle changes alone, followed by an additional 14-pound loss after starting Mounjaro . She experienced a plateau and some weight regain over the holidays  but has since resumed weight loss. Her basal metabolic rate is estimated at 8537 calories per day. She is aware of the need for a calorie deficit and the importance of physical activity in maintaining weight loss. She is considering increasing physical activity and exploring options like yoga and Tai Chi to improve core strength and balance. - Provided a 1000-calorie meal plan with options for breakfast, lunch, and dinner. - Encouraged increasing physical activity, such as 10-minute walks multiple times a day. - Suggested exploring yoga and Tai Chi for core strength and balance. - Advised maintaining a calorie deficit between 1000 and 1400 calories per day for weight loss. - Discussed the importance of a low-carb diet, keeping carbs under 120 grams per day. - Encouraged using meal replacements when on the go. - Discussed the importance of maintaining a calorie deficit and increasing physical activity to overcome weight loss plateau.  Type 2 diabetes mellitus Her A1c has improved from 6.5% to 5.8%, indicating excellent diabetes control. She is on Mounjaro , which has been well-tolerated, and has been managing her diet effectively with a focus on low-carb meals. She understands the importance of maintaining a low-carb diet to manage insulin  levels and prevent weight regain. - Continue Mounjaro  5 mg weekly. - Maintain a low-carb diet, keeping carbs under 120 grams per day. - Monitor blood glucose levels regularly. - Encouraged consistent dietary habits and physical activity to maintain diabetes control.  Recording duration: 35 minutes  Reviewed  relevant laboratory data and prior clinical records to assess metabolic risk and treatment response, including weight trends, biometric measurements and cardiometabolic markers.  Obesity-related comorbidities place patient at elevated cardiovascular and metabolic risk, necessitating close monitoring and longitudinal management.      Objective   Physical  Exam:  Blood pressure 138/84, pulse 65, temperature 98.4 F (36.9 C), height 4' 11 (1.499 m), weight 197 lb (89.4 kg), last menstrual period 09/20/2011, SpO2 96%. Body mass index is 39.79 kg/m.  General: She is overweight, cooperative, alert, well developed, and in no acute distress. PSYCH: Has normal mood, affect and thought process.   HEENT: EOMI, sclerae are anicteric. Lungs: Normal breathing effort, no conversational dyspnea. Extremities: No edema.  Neurologic: No gross sensory or motor deficits. No tremors or fasciculations noted.    Diagnostic Data Reviewed:  BMET    Component Value Date/Time   NA 141 06/04/2024 0000   K 4.5 06/04/2024 0000   CL 103 06/04/2024 0000   CO2 23 (A) 06/04/2024 0000   GLUCOSE 92 01/18/2023 0919   GLUCOSE 103 (H) 09/21/2015 1635   BUN 26 01/18/2023 0919   CREATININE 0.6 06/04/2024 0000   CREATININE 0.70 07/27/2023 0857   CALCIUM 9.4 06/04/2024 0000   GFRNONAA >60 09/21/2015 1635   GFRAA >60 09/21/2015 1635   Lab Results  Component Value Date   HGBA1C 5.8 06/04/2024   HGBA1C 6.4 (H) 08/09/2021   Lab Results  Component Value Date   INSULIN  17.2 01/18/2023   INSULIN  29.6 (H) 08/09/2021   Lab Results  Component Value Date   TSH 2.080 01/18/2023   CBC    Component Value Date/Time   WBC 5.8 06/04/2024 0000   WBC 7.6 09/21/2015 1635   RBC 5.31 (H) 01/18/2023 0919   RBC 5.11 09/29/2022 0000   HGB 14.8 06/04/2024 0000   HGB 14.9 01/18/2023 0919   HCT 48 (A) 06/04/2024 0000   HCT 46.0 01/18/2023 0919   PLT 226 06/04/2024 0000   PLT 240 01/18/2023 0919   MCV 87 01/18/2023 0919   MCH 28.1 01/18/2023 0919   MCH 27.2 09/21/2015 1635   MCHC 32.4 01/18/2023 0919   MCHC 32.3 09/21/2015 1635   RDW 14.6 01/18/2023 0919   Iron Studies No results found for: IRON, TIBC, FERRITIN, IRONPCTSAT Lipid Panel     Component Value Date/Time   CHOL 199 06/04/2024 0000   CHOL 153 01/18/2023 0919   TRIG 130 01/18/2023 0919   HDL 54  06/04/2024 0000   HDL 48 01/18/2023 0919   CHOLHDL 4.8 (H) 05/31/2022 0822   LDLCALC 127 06/04/2024 0000   LDLCALC 82 01/18/2023 0919   Hepatic Function Panel     Component Value Date/Time   PROT 6.9 01/18/2023 0919   ALBUMIN 4.2 06/04/2024 0000   ALBUMIN 4.5 01/18/2023 0919   AST 15 06/04/2024 0000   ALT 23 06/04/2024 0000   ALKPHOS 75 06/04/2024 0000   BILITOT 0.3 01/18/2023 0919      Component Value Date/Time   TSH 2.080 01/18/2023 0919   Nutritional Lab Results  Component Value Date   VD25OH 75.1 01/18/2023   VD25OH 50.9 05/31/2022   VD25OH 58.8 12/14/2021    Medications: Outpatient Encounter Medications as of 06/25/2024  Medication Sig   blood glucose meter kit and supplies KIT Dispense based on patient and insurance preference. Use up to four times daily as directed.   Blood Glucose Monitoring Suppl DEVI 1 each by Does not apply route in the morning, at noon,  and at bedtime. May substitute to any manufacturer covered by patient's insurance.   budesonide-formoterol (SYMBICORT) 160-4.5 MCG/ACT inhaler Inhale 1 puff into the lungs daily.   Cholecalciferol (VITAMIN D3) 25 MCG (1000 UT) CAPS 1,000 IU every other day   estradiol  (ESTRACE ) 0.01 % CREA vaginal cream 1 gram vaginally twice weekly   hyoscyamine (ANASPAZ) 0.125 MG TBDP tablet Place under the tongue as needed.   ketoconazole  (NIZORAL ) 2 % shampoo APPLY TO SCALP LET SIT FOR 3-5 MINUTES AND THEN RINSE.   losartan-hydrochlorothiazide (HYZAAR) 100-25 MG tablet Take 1 tablet by mouth daily.   mometasone  (ELOCON ) 0.1 % lotion Apply topically two to three times weekly.   Multiple Vitamins-Minerals (MULTIVITAMIN PO) Take by mouth. Women over 40 vitamin   pantoprazole (PROTONIX) 40 MG tablet Take 40 mg by mouth daily as needed.   pravastatin (PRAVACHOL) 20 MG tablet Take 20 mg by mouth daily.   [DISCONTINUED] tirzepatide  (MOUNJARO ) 5 MG/0.5ML Pen Inject 5 mg into the skin once a week.   tirzepatide  (MOUNJARO ) 5 MG/0.5ML  Pen Inject 5 mg into the skin once a week.   [DISCONTINUED] rosuvastatin (CRESTOR) 5 MG tablet Take 5 mg by mouth 2 (two) times a week.   No facility-administered encounter medications on file as of 06/25/2024.     Follow-Up   Return in about 4 weeks (around 07/23/2024) for For Weight Mangement with Dr. Francyne.SABRA She was informed of the importance of frequent follow up visits to maximize her success with intensive lifestyle modifications for her multiple health conditions.  Attestation Statement   Reviewed by clinician on day of visit: allergies, medications, problem list, medical history, surgical history, family history, social history, and previous encounter notes.   I have spent 43 minutes in the care of the patient today including: 2 minutes before the visit reviewing and preparing the chart. 35 minutes face-to-face assessing and reviewing listed medical problems as outlined in obesity care plan, providing nutritional and behavioral counseling on topics outlined in the obesity care plan, independently interpreting test results and goals of care, as described in assessment and plan, reviewing and discussing biometric information and progress, and reviewing latest PCP notes and specialist consultations 6 minutes after the visit updating chart and documentation of encounter.    Lucas Francyne, MD  "

## 2024-06-25 NOTE — Assessment & Plan Note (Signed)
 Patient is now on pravastatin had side effects to rosuvastatin she is tolerating medication well without any adverse effects.  In addition to maintaining a diet low in saturated fats she will continue statin therapy

## 2024-06-25 NOTE — Assessment & Plan Note (Signed)
 Vitals:   06/25/24 1000  BP: 138/84    Blood pressure not at goal for age and risk category.  On Hyzaar without adverse effects.  Most recent renal parameters reviewed in Care Everywhere which showed stable electrolytes and kidney function.  Continue with weight loss therapy. Losing 10% may improve blood pressure control. Monitor for symptoms of orthostasis while losing weight. Continue current regimen and home monitoring for a goal blood pressure of < 120/80.

## 2024-06-25 NOTE — Assessment & Plan Note (Signed)
 SABRA

## 2024-07-23 ENCOUNTER — Ambulatory Visit (INDEPENDENT_AMBULATORY_CARE_PROVIDER_SITE_OTHER): Admitting: Internal Medicine

## 2024-08-20 ENCOUNTER — Ambulatory Visit (INDEPENDENT_AMBULATORY_CARE_PROVIDER_SITE_OTHER): Admitting: Internal Medicine

## 2024-08-27 ENCOUNTER — Ambulatory Visit (HOSPITAL_BASED_OUTPATIENT_CLINIC_OR_DEPARTMENT_OTHER): Admitting: Obstetrics & Gynecology
